# Patient Record
Sex: Female | Born: 1960 | Race: Black or African American | Hispanic: No | Smoking: Never smoker
Health system: Southern US, Community
[De-identification: ages and names within clinical notes are randomized; demographics above are authoritative.]

## PROBLEM LIST (undated history)

## (undated) DIAGNOSIS — E785 Hyperlipidemia, unspecified: Secondary | ICD-10-CM

## (undated) DIAGNOSIS — E119 Type 2 diabetes mellitus without complications: Secondary | ICD-10-CM

## (undated) DIAGNOSIS — I10 Essential (primary) hypertension: Secondary | ICD-10-CM

## (undated) DIAGNOSIS — I509 Heart failure, unspecified: Secondary | ICD-10-CM

## (undated) DIAGNOSIS — G9600 Cerebrospinal fluid leak, unspecified: Secondary | ICD-10-CM

## (undated) DIAGNOSIS — G4733 Obstructive sleep apnea (adult) (pediatric): Secondary | ICD-10-CM

## (undated) DIAGNOSIS — I428 Other cardiomyopathies: Secondary | ICD-10-CM

## (undated) DIAGNOSIS — I219 Acute myocardial infarction, unspecified: Secondary | ICD-10-CM

## (undated) DIAGNOSIS — I2510 Atherosclerotic heart disease of native coronary artery without angina pectoris: Secondary | ICD-10-CM

## (undated) DIAGNOSIS — I429 Cardiomyopathy, unspecified: Secondary | ICD-10-CM

## (undated) HISTORY — PX: OTHER SURGICAL HISTORY: SHX169

## (undated) HISTORY — DX: Heart failure, unspecified: I50.9

## (undated) HISTORY — DX: Acute myocardial infarction, unspecified: I21.9

---

## 2014-08-09 LAB — HM COLONOSCOPY

## 2015-10-16 HISTORY — PX: CERVICAL FUSION: SHX112

## 2015-10-16 HISTORY — PX: CSF SHUNT: SHX92

## 2015-10-16 HISTORY — PX: OTHER SURGICAL HISTORY: SHX169

## 2018-11-26 LAB — HM PAP SMEAR

## 2020-07-26 ENCOUNTER — Emergency Department: Payer: Medicare (Managed Care)

## 2020-07-26 ENCOUNTER — Other Ambulatory Visit: Payer: Self-pay

## 2020-07-26 ENCOUNTER — Encounter: Payer: Self-pay | Admitting: Emergency Medicine

## 2020-07-26 DIAGNOSIS — E119 Type 2 diabetes mellitus without complications: Secondary | ICD-10-CM | POA: Diagnosis not present

## 2020-07-26 DIAGNOSIS — R0789 Other chest pain: Secondary | ICD-10-CM | POA: Insufficient documentation

## 2020-07-26 LAB — CBC
HCT: 39.1 % (ref 36.0–46.0)
Hemoglobin: 12.9 g/dL (ref 12.0–15.0)
MCH: 29.1 pg (ref 26.0–34.0)
MCHC: 33 g/dL (ref 30.0–36.0)
MCV: 88.1 fL (ref 80.0–100.0)
Platelets: 330 10*3/uL (ref 150–400)
RBC: 4.44 MIL/uL (ref 3.87–5.11)
RDW: 13.9 % (ref 11.5–15.5)
WBC: 8.1 10*3/uL (ref 4.0–10.5)
nRBC: 0 % (ref 0.0–0.2)

## 2020-07-26 LAB — COMPREHENSIVE METABOLIC PANEL
ALT: 15 U/L (ref 0–44)
AST: 15 U/L (ref 15–41)
Albumin: 4 g/dL (ref 3.5–5.0)
Alkaline Phosphatase: 62 U/L (ref 38–126)
Anion gap: 9 (ref 5–15)
BUN: 14 mg/dL (ref 6–20)
CO2: 24 mmol/L (ref 22–32)
Calcium: 9 mg/dL (ref 8.9–10.3)
Chloride: 105 mmol/L (ref 98–111)
Creatinine, Ser: 0.95 mg/dL (ref 0.44–1.00)
GFR, Estimated: >60 mL/min (ref >60)
Glucose, Bld: 156 mg/dL — ABNORMAL HIGH (ref 70–99)
Potassium: 4.2 mmol/L (ref 3.5–5.1)
Sodium: 138 mmol/L (ref 135–145)
Total Bilirubin: 0.5 mg/dL (ref 0.3–1.2)
Total Protein: 7.9 g/dL (ref 6.5–8.1)

## 2020-07-26 LAB — TROPONIN I (HIGH SENSITIVITY): Troponin I (High Sensitivity): 6 ng/L (ref <18)

## 2020-07-26 NOTE — ED Triage Notes (Signed)
Pt in with co mid sternal chest pain that started today, does have hx of MI in the past. Pain started while arguing with her son, pt did take asa today, no nitro.

## 2020-07-27 ENCOUNTER — Emergency Department: Payer: Medicare (Managed Care)

## 2020-07-27 ENCOUNTER — Emergency Department
Admission: EM | Admit: 2020-07-27 | Discharge: 2020-07-27 | Disposition: A | Discharge status: Home / Self Care | Payer: Medicare (Managed Care) | Attending: Emergency Medicine | Admitting: Emergency Medicine

## 2020-07-27 DIAGNOSIS — R0789 Other chest pain: Secondary | ICD-10-CM

## 2020-07-27 HISTORY — DX: Type 2 diabetes mellitus without complications: E11.9

## 2020-07-27 HISTORY — DX: Heart failure, unspecified: I50.9

## 2020-07-27 HISTORY — DX: Cardiomyopathy, unspecified: I42.9

## 2020-07-27 LAB — TROPONIN I (HIGH SENSITIVITY): Troponin I (High Sensitivity): 6 ng/L (ref <18)

## 2020-07-27 LAB — FIBRIN DERIVATIVES D-DIMER (ARMC ONLY): Fibrin derivatives D-dimer (ARMC): 1101.33 ng/mL (FEU) — ABNORMAL HIGH (ref 0.00–499.00)

## 2020-07-27 MED ORDER — IOHEXOL 350 MG/ML SOLN
75.0000 mL | Freq: Once | INTRAVENOUS | Status: AC | PRN
  Administered 2020-07-27: 75 mL via INTRAVENOUS

## 2020-07-27 NOTE — Discharge Instructions (Addendum)
Your lab tests and CT scan of the chest were all okay today.  We were unable to find any serious cause for your chest pain, and your evaluation is reassuring.  Please follow-up with primary care for continued evaluation of your symptoms.

## 2020-07-27 NOTE — ED Notes (Signed)
Pt awaiting CT scan.

## 2020-07-27 NOTE — ED Provider Notes (Signed)
Procedures     ----------------------------------------- 8:04 AM on 07/27/2020 -----------------------------------------  CT scan of the chest unremarkable.  Vital signs unremarkable, pain resolved.  Stable for discharge home and outpatient follow-up.    Sharman Cheek, MD 07/27/20 [phone]

## 2020-07-27 NOTE — ED Notes (Signed)
Pt In with co chest pain that started yesterday while arguing. Pt states she has hx of MI years go. Denies any recent illness or cough. Pt states she did have some shob, none noted at present.

## 2020-07-27 NOTE — ED Notes (Signed)
Pt resting comfortably at this time. Pt requests to use toilet, this writer assisted pt to bedside toilet. Pt maintained steady gait. No distress noted.

## 2020-07-27 NOTE — ED Provider Notes (Signed)
[facility] Emergency Department Provider Note  ____________________________________________   First MD Initiated Contact with Patient 07/27/20 [phone]     (approximate)  I have reviewed the triage vital signs and the nursing notes.   HISTORY  Chief Complaint Chest Pain    HPI Emily Hinton is a 59 y.o. female with history of cardiomyopathy, MI in 2014, diabetes mellitus presents to the emergency department secondary to acute onset of central nonradiating chest pain which began while having a verbal altercation with her son today. Patient denies any dyspnea no lower extremity pain or swelling. Patient states that pain was 8 out of 10 on arrival to the emergency department. Patient denies any history of DVT or PE.        Past Medical History:  Diagnosis Date  . Cardiomyopathy (HCC)   . Diabetes mellitus without complication (HCC)   . Heart failure (HCC)     There are no problems to display for this patient.     Prior to Admission medications   Not on File    Allergies Patient has no allergy information on record.  No family history on file.  Social History Social History   Tobacco Use  . Smoking status: Not on file  Substance Use Topics  . Alcohol use: Not on file  . Drug use: Not on file    Review of Systems Constitutional: No fever/chills Eyes: No visual changes. ENT: No sore throat. Cardiovascular: Positive for chest pain. Respiratory: Denies shortness of breath. Gastrointestinal: No abdominal pain.  No nausea, no vomiting.  No diarrhea.  No constipation. Genitourinary: Negative for dysuria. Musculoskeletal: Negative for neck pain.  Negative for back pain. Integumentary: Negative for rash. Neurological: Negative for headaches, focal weakness or numbness.  ____________________________________________   PHYSICAL EXAM:  VITAL SIGNS: ED Triage Vitals  Enc Vitals Group     BP 07/26/20 2310 (!) 155/92      Pulse Rate 07/26/20 2310 76     Resp 07/26/20 2310 20     Temp 07/26/20 2310 98.2 F (36.8 C)     Temp Source 07/26/20 2310 Oral     SpO2 07/26/20 2310 97 %     Weight 07/26/20 2311 127.9 kg (282 lb)     Height 07/26/20 2311 1.829 m (6')     Head Circumference --      Peak Flow --      Pain Score 07/26/20 2310 8     Pain Loc --      Pain Edu? --      Excl. in GC? --     Constitutional: Alert and oriented.  Tearful Eyes: Conjunctivae are normal.  Head: Atraumatic. Mouth/Throat: Patient is wearing a mask. Neck: No stridor.  No meningeal signs.   Cardiovascular: Normal rate, regular rhythm. Good peripheral circulation. Grossly normal heart sounds. Respiratory: Normal respiratory effort.  No retractions. Gastrointestinal: Soft and nontender. No distention.  Musculoskeletal: No lower extremity tenderness nor edema. No gross deformities of extremities. Neurologic:  Normal speech and language. No gross focal neurologic deficits are appreciated.  Skin:  Skin is warm, dry and intact. Psychiatric: Mood and affect are normal. Speech and behavior are normal.  ____________________________________________   LABS (all labs ordered are listed, but only abnormal results are displayed)  Labs Reviewed  COMPREHENSIVE METABOLIC PANEL - Abnormal; Notable for the following components:      Result Value   Glucose, Bld 156 (*)    All other components within normal limits  CBC  TROPONIN I (HIGH SENSITIVITY)  TROPONIN I (HIGH SENSITIVITY)   ____________________________________________  EKG  ED ECG REPORT I, [place] N Esha Fincher, the attending physician, personally viewed and interpreted this ECG.   Date: 07/26/2020  EKG Time: 11:06 PM  Rate: 79  Rhythm: Normal sinus rhythm  Axis: Normal  Intervals:Normal  ST&T Change: None  ____________________________________________  RADIOLOGY I, [place] N Morell Mears, personally viewed and evaluated these images (plain radiographs) as part of my medical  decision making, as well as reviewing the written report by the radiologist.  ED MD interpretation: No active cardiopulmonary disease noted on chest x-ray per radiologist.  Official radiology report(s): DG Chest 2 View  Result Date: 07/26/2020 CLINICAL DATA:  Chest pain EXAM: CHEST - 2 VIEW COMPARISON:  None. FINDINGS: The heart size and mediastinal contours are within normal limits. Both lungs are clear. The visualized skeletal structures are unremarkable. IMPRESSION: No active cardiopulmonary disease. Electronically Signed   By: Deatra Robinson M.D.   On: 07/26/2020 23:39      Procedures   ____________________________________________   INITIAL IMPRESSION / MDM / ASSESSMENT AND PLAN / ED COURSE  As part of my medical decision making, I reviewed the following data within the electronic MEDICAL RECORD NUMBER   59 year old female presented with above-stated history and physical exam differential diagnosis including but not limited to ACS, PE, angina.  EKG revealed no evidence of ischemia or infarction laboratory data including high-sensitivity troponin x2 -.  Given elevated D-dimer CT angiogram of the chest ordered and pending at this time.  Patient's care transferred to Dr. Scotty Court  ____________________________________________  FINAL CLINICAL IMPRESSION(S) / ED DIAGNOSES  Final diagnoses:  Atypical chest pain     MEDICATIONS GIVEN DURING THIS VISIT:  Medications - No data to display   ED Discharge Orders    None      *Please note:  Emily Hinton was evaluated in Emergency Department on 07/27/2020 for the symptoms described in the history of present illness. She was evaluated in the context of the global COVID-19 pandemic, which necessitated consideration that the patient might be at risk for infection with the SARS-CoV-2 virus that causes COVID-19. Institutional protocols and algorithms that pertain to the evaluation of patients at risk for COVID-19 are in a state  of rapid change based on information released by regulatory bodies including the CDC and federal and state organizations. These policies and algorithms were followed during the patient's care in the ED.  Some ED evaluations and interventions may be delayed as a result of limited staffing during and after the pandemic.*  Note:  This document was prepared using Dragon voice recognition software and may include unintentional dictation errors.   Darci Current, MD 07/27/20 2223

## 2020-07-27 NOTE — ED Notes (Signed)
Pt at CT

## 2020-07-27 NOTE — ED Notes (Signed)
D/c and f/up discussed with pt, pt verbalized understanding. No distress noted. VSS.

## 2022-01-30 ENCOUNTER — Other Ambulatory Visit: Payer: Self-pay

## 2022-01-30 ENCOUNTER — Encounter: Payer: Self-pay | Admitting: Emergency Medicine

## 2022-01-30 ENCOUNTER — Inpatient Hospital Stay: Payer: Medicare (Managed Care)

## 2022-01-30 ENCOUNTER — Inpatient Hospital Stay (HOSPITAL_COMMUNITY)
Admit: 2022-01-30 | Discharge: 2022-01-30 | Disposition: A | Discharge status: Home / Self Care | Payer: Medicare (Managed Care) | Attending: Internal Medicine | Admitting: Internal Medicine

## 2022-01-30 ENCOUNTER — Emergency Department: Payer: Medicare (Managed Care)

## 2022-01-30 ENCOUNTER — Inpatient Hospital Stay
Admission: EM | Admit: 2022-01-30 | Discharge: 2022-02-03 | DRG: 291 | Disposition: A | Discharge status: Home / Self Care | Payer: Medicare (Managed Care) | Attending: Internal Medicine | Admitting: Internal Medicine

## 2022-01-30 DIAGNOSIS — I110 Hypertensive heart disease with heart failure: Principal | ICD-10-CM | POA: Diagnosis present

## 2022-01-30 DIAGNOSIS — E785 Hyperlipidemia, unspecified: Secondary | ICD-10-CM | POA: Diagnosis present

## 2022-01-30 DIAGNOSIS — I248 Other forms of acute ischemic heart disease: Secondary | ICD-10-CM | POA: Diagnosis present

## 2022-01-30 DIAGNOSIS — I2510 Atherosclerotic heart disease of native coronary artery without angina pectoris: Secondary | ICD-10-CM | POA: Diagnosis present

## 2022-01-30 DIAGNOSIS — Z7984 Long term (current) use of oral hypoglycemic drugs: Secondary | ICD-10-CM | POA: Diagnosis not present

## 2022-01-30 DIAGNOSIS — E1159 Type 2 diabetes mellitus with other circulatory complications: Secondary | ICD-10-CM | POA: Diagnosis not present

## 2022-01-30 DIAGNOSIS — E876 Hypokalemia: Secondary | ICD-10-CM | POA: Diagnosis not present

## 2022-01-30 DIAGNOSIS — I420 Dilated cardiomyopathy: Secondary | ICD-10-CM | POA: Diagnosis present

## 2022-01-30 DIAGNOSIS — Z79899 Other long term (current) drug therapy: Secondary | ICD-10-CM | POA: Diagnosis not present

## 2022-01-30 DIAGNOSIS — J9601 Acute respiratory failure with hypoxia: Secondary | ICD-10-CM | POA: Diagnosis present

## 2022-01-30 DIAGNOSIS — Z982 Presence of cerebrospinal fluid drainage device: Secondary | ICD-10-CM | POA: Diagnosis not present

## 2022-01-30 DIAGNOSIS — I5033 Acute on chronic diastolic (congestive) heart failure: Secondary | ICD-10-CM | POA: Diagnosis present

## 2022-01-30 DIAGNOSIS — Z20822 Contact with and (suspected) exposure to covid-19: Secondary | ICD-10-CM | POA: Diagnosis present

## 2022-01-30 DIAGNOSIS — I5031 Acute diastolic (congestive) heart failure: Secondary | ICD-10-CM | POA: Diagnosis not present

## 2022-01-30 DIAGNOSIS — J189 Pneumonia, unspecified organism: Secondary | ICD-10-CM | POA: Diagnosis present

## 2022-01-30 DIAGNOSIS — I5043 Acute on chronic combined systolic (congestive) and diastolic (congestive) heart failure: Secondary | ICD-10-CM | POA: Diagnosis present

## 2022-01-30 DIAGNOSIS — Z981 Arthrodesis status: Secondary | ICD-10-CM

## 2022-01-30 DIAGNOSIS — Z7982 Long term (current) use of aspirin: Secondary | ICD-10-CM | POA: Diagnosis not present

## 2022-01-30 DIAGNOSIS — I509 Heart failure, unspecified: Secondary | ICD-10-CM

## 2022-01-30 DIAGNOSIS — G4733 Obstructive sleep apnea (adult) (pediatric): Secondary | ICD-10-CM | POA: Diagnosis present

## 2022-01-30 DIAGNOSIS — I428 Other cardiomyopathies: Secondary | ICD-10-CM | POA: Diagnosis present

## 2022-01-30 DIAGNOSIS — Z91198 Patient's noncompliance with other medical treatment and regimen for other reason: Secondary | ICD-10-CM

## 2022-01-30 DIAGNOSIS — E875 Hyperkalemia: Secondary | ICD-10-CM | POA: Diagnosis not present

## 2022-01-30 DIAGNOSIS — Z6836 Body mass index (BMI) 36.0-36.9, adult: Secondary | ICD-10-CM

## 2022-01-30 DIAGNOSIS — Z794 Long term (current) use of insulin: Secondary | ICD-10-CM | POA: Diagnosis not present

## 2022-01-30 DIAGNOSIS — E119 Type 2 diabetes mellitus without complications: Secondary | ICD-10-CM

## 2022-01-30 DIAGNOSIS — J9600 Acute respiratory failure, unspecified whether with hypoxia or hypercapnia: Secondary | ICD-10-CM | POA: Diagnosis present

## 2022-01-30 DIAGNOSIS — Z6837 Body mass index (BMI) 37.0-37.9, adult: Secondary | ICD-10-CM | POA: Diagnosis not present

## 2022-01-30 DIAGNOSIS — E1165 Type 2 diabetes mellitus with hyperglycemia: Secondary | ICD-10-CM | POA: Diagnosis present

## 2022-01-30 DIAGNOSIS — I10 Essential (primary) hypertension: Secondary | ICD-10-CM | POA: Diagnosis not present

## 2022-01-30 DIAGNOSIS — E1169 Type 2 diabetes mellitus with other specified complication: Secondary | ICD-10-CM

## 2022-01-30 HISTORY — DX: Cerebrospinal fluid leak, unspecified: G96.00

## 2022-01-30 HISTORY — DX: Atherosclerotic heart disease of native coronary artery without angina pectoris: I25.10

## 2022-01-30 HISTORY — DX: Acute on chronic diastolic (congestive) heart failure: I50.33

## 2022-01-30 HISTORY — DX: Other cardiomyopathies: I42.8

## 2022-01-30 HISTORY — DX: Essential (primary) hypertension: I10

## 2022-01-30 HISTORY — DX: Morbid (severe) obesity due to excess calories: E66.01

## 2022-01-30 HISTORY — DX: Hyperlipidemia, unspecified: E78.5

## 2022-01-30 HISTORY — DX: Type 2 diabetes mellitus without complications: E11.9

## 2022-01-30 HISTORY — DX: Obstructive sleep apnea (adult) (pediatric): G47.33

## 2022-01-30 LAB — URINALYSIS, COMPLETE (UACMP) WITH MICROSCOPIC
Bacteria, UA: NONE SEEN
Bilirubin Urine: NEGATIVE
Glucose, UA: NEGATIVE mg/dL
Hgb urine dipstick: NEGATIVE
Ketones, ur: NEGATIVE mg/dL
Leukocytes,Ua: NEGATIVE
Nitrite: NEGATIVE
Protein, ur: NEGATIVE mg/dL
Specific Gravity, Urine: 1.004 — ABNORMAL LOW (ref 1.005–1.030)
WBC, UA: NONE SEEN WBC/hpf (ref 0–5)
pH: 7 (ref 5.0–8.0)

## 2022-01-30 LAB — PROCALCITONIN: Procalcitonin: <0.1 ng/mL

## 2022-01-30 LAB — ECHOCARDIOGRAM COMPLETE
AR max vel: 1.51 cm2
AV Area VTI: 1.47 cm2
AV Area mean vel: 1.57 cm2
AV Mean grad: 4 mmHg
AV Peak grad: 7.1 mmHg
Ao pk vel: 1.33 m/s
Area-P 1/2: 4.65 cm2
Height: 71 in
MV VTI: 1.58 cm2
S' Lateral: 4.4 cm
Weight: 4224 oz

## 2022-01-30 LAB — CBC WITH DIFFERENTIAL/PLATELET
Abs Immature Granulocytes: 0.03 10*3/uL (ref 0.00–0.07)
Basophils Absolute: 0 10*3/uL (ref 0.0–0.1)
Basophils Relative: 0 %
Eosinophils Absolute: 0.1 10*3/uL (ref 0.0–0.5)
Eosinophils Relative: 1 %
HCT: 48.3 % — ABNORMAL HIGH (ref 36.0–46.0)
Hemoglobin: 15.5 g/dL — ABNORMAL HIGH (ref 12.0–15.0)
Immature Granulocytes: 0 %
Lymphocytes Relative: 24 %
Lymphs Abs: 2.8 10*3/uL (ref 0.7–4.0)
MCH: 28.4 pg (ref 26.0–34.0)
MCHC: 32.1 g/dL (ref 30.0–36.0)
MCV: 88.5 fL (ref 80.0–100.0)
Monocytes Absolute: 1 10*3/uL (ref 0.1–1.0)
Monocytes Relative: 8 %
Neutro Abs: 7.7 10*3/uL (ref 1.7–7.7)
Neutrophils Relative %: 67 %
Platelets: 393 10*3/uL (ref 150–400)
RBC: 5.46 MIL/uL — ABNORMAL HIGH (ref 3.87–5.11)
RDW: 14.1 % (ref 11.5–15.5)
WBC: 11.6 10*3/uL — ABNORMAL HIGH (ref 4.0–10.5)
nRBC: 0 % (ref 0.0–0.2)

## 2022-01-30 LAB — GLUCOSE, CAPILLARY: Glucose-Capillary: 299 mg/dL — ABNORMAL HIGH (ref 70–99)

## 2022-01-30 LAB — BLOOD GAS, VENOUS
Acid-Base Excess: 1 mmol/L (ref 0.0–2.0)
Bicarbonate: 26.6 mmol/L (ref 20.0–28.0)
O2 Saturation: 34.8 %
Patient temperature: 37
pCO2, Ven: 45 mmHg (ref 44–60)
pH, Ven: 7.38 (ref 7.25–7.43)
pO2, Ven: <31 mmHg — CL (ref 32–45)

## 2022-01-30 LAB — BASIC METABOLIC PANEL
Anion gap: 10 (ref 5–15)
BUN: 11 mg/dL (ref 6–20)
CO2: 25 mmol/L (ref 22–32)
Calcium: 9.3 mg/dL (ref 8.9–10.3)
Chloride: 102 mmol/L (ref 98–111)
Creatinine, Ser: 0.87 mg/dL (ref 0.44–1.00)
GFR, Estimated: >60 mL/min (ref >60)
Glucose, Bld: 193 mg/dL — ABNORMAL HIGH (ref 70–99)
Potassium: 3.6 mmol/L (ref 3.5–5.1)
Sodium: 137 mmol/L (ref 135–145)

## 2022-01-30 LAB — RESP PANEL BY RT-PCR (FLU A&B, COVID) ARPGX2
Influenza A by PCR: NEGATIVE
Influenza B by PCR: NEGATIVE
SARS Coronavirus 2 by RT PCR: NEGATIVE

## 2022-01-30 LAB — CBG MONITORING, ED
Glucose-Capillary: 186 mg/dL — ABNORMAL HIGH (ref 70–99)
Glucose-Capillary: 181 mg/dL — ABNORMAL HIGH (ref 70–99)

## 2022-01-30 LAB — TROPONIN I (HIGH SENSITIVITY)
Troponin I (High Sensitivity): 35 ng/L — ABNORMAL HIGH (ref <18)
Troponin I (High Sensitivity): 20 ng/L — ABNORMAL HIGH (ref <18)

## 2022-01-30 LAB — BRAIN NATRIURETIC PEPTIDE: B Natriuretic Peptide: 189.7 pg/mL — ABNORMAL HIGH (ref 0.0–100.0)

## 2022-01-30 MED ORDER — ONDANSETRON HCL 4 MG/2ML IJ SOLN: 4.0000 mg | Freq: Three times a day (TID) | INTRAMUSCULAR | Status: AC | PRN

## 2022-01-30 MED ORDER — SODIUM CHLORIDE 0.9 % IV SOLN
2.0000 g | INTRAVENOUS | Status: DC
  Administered 2022-01-31 – 2022-02-03 (×4): 2 g via INTRAVENOUS
  Filled 2022-01-30: qty 20
  Filled 2022-01-30: qty 2
  Filled 2022-01-30 (×2): qty 20

## 2022-01-30 MED ORDER — INSULIN DETEMIR 100 UNIT/ML ~~LOC~~ SOLN
35.0000 [IU] | Freq: Every day | SUBCUTANEOUS | Status: DC
  Administered 2022-01-30 – 2022-02-02 (×4): 35 [IU] via SUBCUTANEOUS
  Filled 2022-01-30 (×6): qty 0.35

## 2022-01-30 MED ORDER — CARVEDILOL 6.25 MG PO TABS
6.2500 mg | ORAL_TABLET | Freq: Two times a day (BID) | ORAL | Status: DC
Start: 2022-01-30 — End: 2022-02-03
  Administered 2022-01-30 – 2022-02-03 (×9): 6.25 mg via ORAL
  Filled 2022-01-30 (×9): qty 1

## 2022-01-30 MED ORDER — ACETAMINOPHEN 650 MG RE SUPP: 650.0000 mg | Freq: Four times a day (QID) | RECTAL | Status: DC | PRN

## 2022-01-30 MED ORDER — LISINOPRIL 20 MG PO TABS
40.0000 mg | ORAL_TABLET | Freq: Every day | ORAL | Status: DC
  Administered 2022-01-30 – 2022-02-01 (×3): 40 mg via ORAL
  Filled 2022-01-30 (×2): qty 2
  Filled 2022-01-30: qty 4

## 2022-01-30 MED ORDER — SODIUM CHLORIDE 0.9 % IV SOLN
250.0000 mL | INTRAVENOUS | Status: DC | PRN
  Administered 2022-02-02: 250 mL via INTRAVENOUS

## 2022-01-30 MED ORDER — SODIUM CHLORIDE 0.9% FLUSH
3.0000 mL | Freq: Two times a day (BID) | INTRAVENOUS | Status: DC
  Administered 2022-01-30 – 2022-02-03 (×8): 3 mL via INTRAVENOUS

## 2022-01-30 MED ORDER — FUROSEMIDE 10 MG/ML IJ SOLN: 40.0000 mg | Freq: Two times a day (BID) | INTRAMUSCULAR | Status: DC

## 2022-01-30 MED ORDER — ENOXAPARIN SODIUM 40 MG/0.4ML IJ SOSY: 40.0000 mg | PREFILLED_SYRINGE | INTRAMUSCULAR | Status: DC

## 2022-01-30 MED ORDER — PANTOPRAZOLE SODIUM 40 MG PO TBEC
40.0000 mg | DELAYED_RELEASE_TABLET | Freq: Every day | ORAL | Status: DC
  Administered 2022-01-30 – 2022-02-03 (×5): 40 mg via ORAL
  Filled 2022-01-30 (×5): qty 1

## 2022-01-30 MED ORDER — GABAPENTIN 600 MG PO TABS
600.0000 mg | ORAL_TABLET | Freq: Three times a day (TID) | ORAL | Status: DC
  Administered 2022-01-30 – 2022-02-03 (×13): 600 mg via ORAL
  Filled 2022-01-30 (×13): qty 1

## 2022-01-30 MED ORDER — INSULIN ASPART 100 UNIT/ML IJ SOLN
0.0000 [IU] | Freq: Three times a day (TID) | INTRAMUSCULAR | Status: DC
  Administered 2022-01-30 – 2022-01-31 (×4): 3 [IU] via SUBCUTANEOUS
  Administered 2022-01-31 – 2022-02-01 (×2): 5 [IU] via SUBCUTANEOUS
  Administered 2022-02-01 – 2022-02-03 (×6): 3 [IU] via SUBCUTANEOUS
  Filled 2022-01-30 (×12): qty 1

## 2022-01-30 MED ORDER — ACETAMINOPHEN 500 MG PO TABS
1000.0000 mg | ORAL_TABLET | Freq: Once | ORAL | Status: AC
  Administered 2022-01-30: 1000 mg via ORAL
  Filled 2022-01-30: qty 2

## 2022-01-30 MED ORDER — SODIUM CHLORIDE 0.9 % IV SOLN
500.0000 mg | INTRAVENOUS | Status: DC
  Administered 2022-01-31 – 2022-02-03 (×4): 500 mg via INTRAVENOUS
  Filled 2022-01-30 (×3): qty 5
  Filled 2022-01-30: qty 500

## 2022-01-30 MED ORDER — ENOXAPARIN SODIUM 60 MG/0.6ML IJ SOSY
0.5000 mg/kg | PREFILLED_SYRINGE | INTRAMUSCULAR | Status: DC
  Administered 2022-01-30 – 2022-02-02 (×4): 60 mg via SUBCUTANEOUS
  Filled 2022-01-30 (×4): qty 0.6

## 2022-01-30 MED ORDER — SODIUM CHLORIDE 0.9% FLUSH: 3.0000 mL | INTRAVENOUS | Status: DC | PRN

## 2022-01-30 MED ORDER — SPIRONOLACTONE 25 MG PO TABS
25.0000 mg | ORAL_TABLET | Freq: Every day | ORAL | Status: DC
  Administered 2022-01-30 – 2022-02-03 (×5): 25 mg via ORAL
  Filled 2022-01-30 (×5): qty 1

## 2022-01-30 MED ORDER — ASPIRIN 81 MG PO CHEW
81.0000 mg | CHEWABLE_TABLET | Freq: Every day | ORAL | Status: DC
Start: 2022-01-30 — End: 2022-02-03
  Administered 2022-01-30 – 2022-02-03 (×5): 81 mg via ORAL
  Filled 2022-01-30 (×5): qty 1

## 2022-01-30 MED ORDER — ACETAMINOPHEN 325 MG PO TABS
650.0000 mg | ORAL_TABLET | Freq: Four times a day (QID) | ORAL | Status: DC | PRN
  Administered 2022-02-02 (×2): 650 mg via ORAL
  Filled 2022-01-30 (×2): qty 2

## 2022-01-30 MED ORDER — TORSEMIDE 20 MG PO TABS
20.0000 mg | ORAL_TABLET | Freq: Every day | ORAL | Status: DC
  Administered 2022-01-31: 20 mg via ORAL
  Filled 2022-01-30: qty 1

## 2022-01-30 MED ORDER — SODIUM CHLORIDE 0.9 % IV SOLN
1.0000 g | Freq: Once | INTRAVENOUS | Status: AC
  Administered 2022-01-30: 1 g via INTRAVENOUS
  Filled 2022-01-30: qty 10

## 2022-01-30 MED ORDER — ALBUTEROL SULFATE (2.5 MG/3ML) 0.083% IN NEBU: 2.5000 mg | INHALATION_SOLUTION | RESPIRATORY_TRACT | Status: AC | PRN

## 2022-01-30 MED ORDER — SODIUM CHLORIDE 0.9 % IV SOLN
500.0000 mg | Freq: Once | INTRAVENOUS | Status: AC
  Administered 2022-01-30: 500 mg via INTRAVENOUS
  Filled 2022-01-30: qty 5

## 2022-01-30 MED ORDER — FUROSEMIDE 10 MG/ML IJ SOLN
40.0000 mg | Freq: Once | INTRAMUSCULAR | Status: AC
  Administered 2022-01-30: 40 mg via INTRAVENOUS
  Filled 2022-01-30: qty 4

## 2022-01-30 NOTE — Assessment & Plan Note (Addendum)
Patient presents for evaluation of worsening shortness of breath associated with a cough productive of blood-tinged phlegm ?Chest x-ray shows findings concerning for CHF ?Patient has a known history of nonischemic cardiomyopathy with a last known LVEF of 55% from a 2D echocardiogram done 07/22 ?Continue torsemide 20 mg daily.  Patient received a dose of Lasix 40 mg IV x1 ?Continue carvedilol, spironolactone and lisinopril ?We will repeat 2D echocardiogram to assess LVEF ?

## 2022-01-30 NOTE — Plan of Care (Signed)
Patient has been educated on her current condition and care plan for this admission. ?

## 2022-01-30 NOTE — Assessment & Plan Note (Signed)
Patient presents for evaluation of worsening shortness of breath associated with cough productive of blood-tinged sputum ?She had a low-grade fever with a Tmax of 100.1 as well as leukocytosis ?CT scan of the chest without contrast shows multifocal pneumonia ?We will treat patient empirically with Rocephin and Zithromax ?Follow-up results of blood cultures ? ?

## 2022-01-30 NOTE — Assessment & Plan Note (Addendum)
Secondary to acute diastolic dysfunction CHF and multifocal pneumonia ?Patient presented to the ER for evaluation of worsening shortness of breath and was noted to be in respiratory distress, she was tachypneic and was using accessory muscles upon presentation. ?Her room air pulse oximetry was 72% ?She is currently on BiPAP to improve oxygenation and reduce work of breathing. ?We will attempt to wean off BiPAP as tolerated ?

## 2022-01-30 NOTE — ED Notes (Signed)
Pt transported to CT via stretcher.  

## 2022-01-30 NOTE — Progress Notes (Signed)
Patient arrives floor alert and oriented. Vital signs taken are below ? ? ? 01/30/22 2022  ?Vitals  ?Temp 98.2 ?F (36.8 ?C)  ?BP 120/82  ?MAP (mmHg) 93  ?BP Location Right Arm  ?BP Method Automatic  ?Patient Position (if appropriate) Lying  ?Pulse Rate 89  ?Pulse Rate Source Monitor  ?Resp 20  ?MEWS COLOR  ?MEWS Score Color Green  ?Oxygen Therapy  ?SpO2 100 %  ?O2 Device Nasal Cannula  ?O2 Flow Rate (L/min) 4 L/min  ?MEWS Score  ?MEWS Temp 0  ?MEWS Systolic 0  ?MEWS Pulse 0  ?MEWS RR 0  ?MEWS LOC 0  ?MEWS Score 0  ? ? ?

## 2022-01-30 NOTE — ED Notes (Signed)
Patient complains of shortness of breath with Oxygen saturation at 72% in ED. Patient currently on 4L [place], takes pills whole with water. Alert and Oriented. Diabetic and AC/HS. VTE is Lovenox. ?

## 2022-01-30 NOTE — Assessment & Plan Note (Signed)
Maintain consistent carbohydrate diet ?Hold oral hypoglycemic agents ?Continue long-acting insulin ?Glycemic control with sliding scale insulin ?

## 2022-01-30 NOTE — ED Provider Notes (Signed)
? ?[facility] ?Provider Note ? ? ? Event Date/Time  ? First MD Initiated Contact with Patient 01/30/22 0316   ?  (approximate) ? ? ?History  ? ?Shortness of Breath and Cough ? ? ?HPI ? ?Emily Hinton is a 61 y.o. 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  Patient reports 2 days of progressively worsening shortness of breath and a cough initially productive of clear phlegm now with some blood streaking on it.  Today had some chills but denies any fever.  No chest pain.  No prior history of PE or DVT, no recent travel immobilization, no leg pain or swelling, no hemoptysis or exogenous hormones.  Endorses compliance with her medications.  Patient arrives in severe respiratory distress ?  ? ? ?Past Medical History:  ?Diagnosis Date  ? Cardiomyopathy (HCC)   ? Diabetes mellitus without complication (HCC)   ? Heart failure (HCC)   ? ? ?History reviewed. No pertinent surgical history. ? ? ?Physical Exam  ? ?Triage Vital Signs: ?ED Triage Vitals  ?Enc Vitals Group  ?   BP 01/30/22 0316 (!) 184/110  ?   Pulse Rate 01/30/22 0316 (!) 117  ?   Resp 01/30/22 0316 (!) 34  ?   Temp 01/30/22 0316 100.1 ?F (37.8 ?C)  ?   Temp Source 01/30/22 0316 Oral  ?   SpO2 01/30/22 0316 (!) 72 %  ?   Weight 01/30/22 0315 264 lb (119.7 kg)  ?   Height 01/30/22 0315 5\' 11"  (1.803 m)  ?   Head Circumference --   ?   Peak Flow --   ?   Pain Score 01/30/22 0315 0  ?   Pain Loc --   ?   Pain Edu? --   ?   Excl. in GC? --   ? ? ?Most recent vital signs: ?Vitals:  ? 01/30/22 0352 01/30/22 0400  ?BP:  (!) 146/96  ?Pulse: 99 97  ?Resp: (!) 29 (!) 25  ?Temp:    ?SpO2: 97% 96%  ? ? ? ?Constitutional: Alert and oriented.  Severe respiratory distress ?HEENT: ?     Head: Normocephalic and atraumatic.    ?     Eyes: Conjunctivae are normal. Sclera is non-icteric.  ?     Mouth/Throat: Mucous membranes are  moist.  ?     Neck: Supple with no signs of meningismus. ?Cardiovascular: Regular rate and rhythm. No murmurs, gallops, or rubs. 2+ symmetrical distal pulses are present in all extremities.  ?Respiratory: Severe respiratory distress with sats in the 70s.  Placed on 6 L nasal cannula still satting in the upper 80s, actively coughing, crackles bilaterally with no wheezing ?Gastrointestinal: Soft, non tender, and non distended with positive bowel sounds. No rebound or guarding. ?Genitourinary: No CVA tenderness. ?Musculoskeletal:  No edema, cyanosis, or erythema of extremities.  Trace edema bilaterally ?Neurologic: Normal speech and language. Face is symmetric. Moving all extremities. No gross focal neurologic deficits are appreciated. ?Skin: Skin is warm, dry and intact. No rash noted. ?Psychiatric: Mood and affect are normal. Speech and behavior are normal. ? ?ED Results / Procedures / Treatments  ? ?Labs ?(all labs ordered are listed, but only abnormal results are displayed) ?Labs Reviewed  ?BRAIN NATRIURETIC PEPTIDE - Abnormal; Notable for the following components:  ?    Result Value  ? B Natriuretic  Peptide 189.7 (*)   ? All other components within normal limits  ?BASIC METABOLIC PANEL - Abnormal; Notable for the following components:  ? Glucose, Bld 193 (*)   ? All other components within normal limits  ?CBC WITH DIFFERENTIAL/PLATELET - Abnormal; Notable for the following components:  ? WBC 11.6 (*)   ? RBC 5.46 (*)   ? Hemoglobin 15.5 (*)   ? HCT 48.3 (*)   ? All other components within normal limits  ?BLOOD GAS, VENOUS - Abnormal; Notable for the following components:  ? pO2, Ven <31 (*)   ? All other components within normal limits  ?TROPONIN I (HIGH SENSITIVITY) - Abnormal; Notable for the following components:  ? Troponin I (High Sensitivity) 35 (*)   ? All other components within normal limits  ?RESP PANEL BY RT-PCR (FLU A&B, COVID) ARPGX2  ?CULTURE, BLOOD (ROUTINE X 2)  ?CULTURE, BLOOD (ROUTINE X 2)   ?PROCALCITONIN  ? ? ? ?EKG ? ?ED ECG REPORT ?I, Nita Sickle, the attending physician, personally viewed and interpreted this ECG. ? ?Sinus rhythm with a rate of 98, normal intervals, normal axis, no ST elevations or depressions ? ?RADIOLOGY ?I, Nita Sickle, attending MD, have personally viewed and interpreted the images obtained during this visit as below: ? ?Chest x-ray showing CHF exacerbation with cardiomegaly and edema ? ? ?___________________________________________________ ?Interpretation by Radiologist:  ?DG Chest Portable 1 View ? ?Result Date: 01/30/2022 ?CLINICAL DATA:  Shortness of breath and coughing up blood. Oxygenation 72% on room air. EXAM: PORTABLE CHEST 1 VIEW COMPARISON:  PA and lateral 07/27/2019 FINDINGS: There is mild cardiomegaly and increased perihilar vascular congestion. There is development of generalized interstitial consolidation consistent with interstitial edema, and small pleural effusions. There is trace fluid in the horizontal fissure. There are bilateral perihilar hazy opacities which are probably due to ground-glass edema, pneumonia possible but less likely given other findings. The peripheral lungs clear. There is mild chronic elevation of the right diaphragm. Stable mediastinal configuration.  Thoracic cage is intact. IMPRESSION: 1. Mild cardiomegaly with perihilar vascular congestion and generalized interstitial edema consistent with CHF or fluid overload. 2. Perihilar opacities which are probably due to alveolar edema, less likely pneumonia. 3. Small pleural effusions are beginning to form. 4. Clinical correlation and radiographic follow-up recommended. Electronically Signed   By: Almira Bar M.D.   On: 01/30/2022 03:46   ? ? ? ? ?PROCEDURES: ? ?Critical Care performed: Yes, see critical care procedure note(s) ? ?.Critical Care ?Performed by: Nita Sickle, MD ?Authorized by: Nita Sickle, MD  ? ?Critical care provider statement:  ?  Critical care  time (minutes):  40 ?  Critical care time was exclusive of:  Separately billable procedures and treating other patients ?  Critical care was necessary to treat or prevent imminent or life-threatening deterioration of the following conditions:  Respiratory failure, circulatory failure, cardiac failure and shock ?  Critical care was time spent personally by me on the following activities:  Development of treatment plan with patient or surrogate, discussions with consultants, evaluation of patient's response to treatment, examination of patient, ordering and review of laboratory studies, ordering and review of radiographic studies, ordering and performing treatments and interventions, pulse oximetry, re-evaluation of patient's condition and review of old charts ?  I assumed direction of critical care for this patient from another provider in my specialty: no   ?  Care discussed with: admitting provider   ? ? ? ?IMPRESSION / MDM / ASSESSMENT AND PLAN / ED COURSE  ?  I reviewed the triage vital signs and the nursing notes. ? ?61 y.o. 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  Patient arrives in severe respiratory distress satting in the low 70s on room air.  Placed on 6 L nasal cannula with sats in the mid to upper 80s.  Crackles bilaterally and trace pitting edema bilaterally.  Patient was placed on BiPAP immediately ? ?Ddx: CHF exacerbation versus pneumonia versus viral syndrome such as COVID or flu versus pericarditis versus myocarditis versus pericardial effusion versus PE ? ? ?Plan: EKG, troponin x2, chest x-ray, BMP, BNP, CBC, VBG, COVID and flu swab ? ? ?MEDICATIONS GIVEN IN ED: ?Medications  ?cefTRIAXone (ROCEPHIN) 1 g in sodium chloride 0.9 % 100 mL IVPB (has no administration in time range)  ?azithromycin (ZITHROMAX) 500 mg in sodium chloride 0.9 % 250 mL IVPB (has no administration in time  range)  ?acetaminophen (TYLENOL) tablet 1,000 mg (has no administration in time range)  ?furosemide (LASIX) injection 40 mg (40 mg Intravenous Given 01/30/22 0413)  ? ? ? ?ED COURSE: Chest x-ray consistent with CHF ex

## 2022-01-30 NOTE — ED Triage Notes (Signed)
Pt to ED via POV with c/o increasing SOB and coughing up blood. Pt states noted streaks of blood in her sputum. Pt with noted tachypnea in triage. Pt RA sats noted to be 72% in triage, placed on 4L via [place] and taken to room 12.  ?

## 2022-01-30 NOTE — Progress Notes (Signed)
PHARMACIST - PHYSICIAN COMMUNICATION ? ?CONCERNING:  Enoxaparin (Lovenox) for DVT Prophylaxis  ? ? ?RECOMMENDATION: ?Patient was prescribed enoxaprin 40mg  q24 hours for VTE prophylaxis.  ? Weights  ? 01/30/22 0315  ?Weight: 119.7 kg (264 lb)  ? ? ?Body mass index is 36.82 kg/m?. ? ?Estimated Creatinine Clearance: 99.4 mL/min (by C-G formula based on SCr of 0.87 mg/dL). ? ? ?Based on [facility] policy patient is candidate for enoxaparin 0.5mg /kg TBW SQ every 24 hours based on BMI being >30. ? ? ?DESCRIPTION: ?Pharmacy has adjusted enoxaparin dose per [facility] policy. ? ?Patient is now receiving enoxaparin 60 mg every 24 hours  ? ? ?Alexsandro Salek Rodriguez-Guzman PharmD, BCPS ?01/30/2022 10:48 AM ? ? ?

## 2022-01-30 NOTE — Progress Notes (Addendum)
ED Nurse Cinda Quest Motsinger, (RN) just informed (direct messaged) Inpatient RN of room assignment @ 7:45 pm. Informed to send patient up at 7:50 pm ? ?SBAR completed. ?

## 2022-01-30 NOTE — ED Notes (Signed)
Informed RN bed assigned 

## 2022-01-30 NOTE — TOC Initial Note (Signed)
Transition of Care (TOC) - Initial/Assessment Note  ? ? ?Patient Details  ?Name: Emily Hinton ?MRN: [id number] ?Date of Birth: 12/07/1960 ? ?Transition of Care (TOC) CM/SW Contact:    ?Shelbie Hutching, RN ?Phone Number: ?01/30/2022, 11:04 AM ? ?Clinical Narrative:                 ?Patient admitted for acute respiratory failure, acute on chronic CHF on acute O2 [place] at 4L, does not wear oxygen at home. ?RNCM met with patient at the bedside, introduced self and explained role in DC planning.  Patient recently moved down here from [place].  She is living with her father and 2 grown sons.  Patient is independent at home, she drives very short distances but her sons provide most of the transportation.  She has not had a chance yet to establish new doctors or change her insurance but she is aware she needs to do this and will work on it as soon as she gets out of the hospital.  She is on medications but she gets 3 month supply at the time so has plenty until she gets a new PCP.  She requests a prescription for lasix at discharge- she will need to speak with the MD about that.  One of her sons will pick her up at discharge.   ? ?TOC will follow for needs.   ? ?Expected Discharge Plan: Home/Self Care ?Barriers to Discharge: Continued Medical Work up ? ? ?Patient Goals and CMS Choice ?Patient states their goals for this hospitalization and ongoing recovery are:: to feel better, would like Lasix prescribed at discharge ?  ?  ? ?Expected Discharge Plan and Services ?Expected Discharge Plan: Home/Self Care ?  ?Discharge Planning Services: CM Consult ?  ?Living arrangements for the past 2 months: [place] ?                ?DME Arranged: N/A ?DME Agency: NA ?  ?  ?  ?HH Arranged: NA ?[place] Agency: NA ?  ?  ?  ? ?Prior Living Arrangements/Services ?Living arrangements for the past 2 months: [place] ?Lives with:: Parents, Adult Children ?Patient language and need for interpreter reviewed:: Yes ?Do you feel  safe going back to the place where you live?: Yes      ?Need for Family Participation in Patient Care: Yes (Comment) ?Care giver support system in place?: Yes (comment) (sons and dad) ?  ?Criminal Activity/Legal Involvement Pertinent to Current Situation/Hospitalization: No - Comment as needed ? ?Activities of Daily Living ?  ?  ? ?Permission Sought/Granted ?Permission sought to share information with : Case Manager, Family Supports ?Permission granted to share information with : Yes, Verbal Permission Granted ? Share Information with NAME: Margaret Pyle ?   ? Permission granted to share info w Relationship: son ? Permission granted to share info w Contact Information: [phone] ? ?Emotional Assessment ?Appearance:: Appears stated age ?Attitude/Demeanor/Rapport: Engaged ?Affect (typically observed): Accepting ?Orientation: : Oriented to Self, Oriented to Place, Oriented to  Time, Oriented to Situation ?Alcohol / Substance Use: Not Applicable ?Psych Involvement: No (comment) ? ?Admission diagnosis:  CAP (community acquired pneumonia) [J18.9] ?Patient Active Problem List  ? Diagnosis Date Noted  ? CAP (community acquired pneumonia) 01/30/2022  ? Acute respiratory failure ([place]) 01/30/2022  ? Acute on chronic diastolic CHF (congestive heart failure) ([place]) 01/30/2022  ? Diabetes mellitus ([place]) 01/30/2022  ? Essential hypertension 01/30/2022  ? ?PCP:  Patient, No Pcp Per (Inactive) ?Pharmacy:   ?  [id number] [address], [address] ?[place] ?[place] [address] ?Phone: [phone] Fax: [phone] ? ? ? ? ?Social Determinants of Health (SDOH) Interventions ?  ? ?Readmission Risk Interventions ?   ? View : No data to display.  ?  ?  ?  ? ? ? ?

## 2022-01-30 NOTE — H&P (Addendum)
?History and Physical  ? ? ?Patient: Emily Hinton [id number] DOB: 07-11-1961 ?DOA: 01/30/2022 ?DOS: the patient was seen and examined on 01/30/2022 ?PCP: Patient, No Pcp Per (Inactive)  ?Patient coming from: Home ? ?Chief Complaint:  ?Chief Complaint  ?Patient presents with  ? Shortness of Breath  ? Cough  ? ?HPI: Emily Hinton is a 61 y.o. female with medical history significant for nonischemic cardiomyopathy (last known LVEF of 55% from 07/22), chronic diastolic dysfunction CHF, diabetes mellitus who presents to the ER for evaluation of worsening shortness of breath and a cough productive of sputum streaked with blood. ?At baseline patient is usually short of breath with moderate exertion but over the last several days she has had worsening shortness of breath to the point that she is short of breath at rest.  She denies having any fever or chills, no leg swelling, no headache, no dizziness, no lightheadedness, no chest pain, no nausea, no vomiting, no blurred vision or diaphoresis. ?Upon arrival to the ER she was noted to be in respiratory distress and had room air pulse oximetry of 72%.  She was placed on 4 L of oxygen via nasal cannula and then transition to BiPAP to improve oxygenation and reduce work of breathing. ?Blood pressure was significantly elevated at 184/110 5 she had a low-grade temp with a Tmax of 100.1 ?Review of Systems: As mentioned in the history of present illness. All other systems reviewed and are negative. ?Past Medical History:  ?Diagnosis Date  ? Cardiomyopathy ([place])   ? Diabetes mellitus without complication ([place])   ? Heart failure ([place])   ? ?History reviewed. No pertinent surgical history. ?Social History:  has no history on file for tobacco use, alcohol use, and drug use. ? ?Allergies  ?Allergen Reactions  ? Cashew Nut Oil Hives, Itching and Rash  ? ? ?History reviewed. No pertinent family history. ? ?Prior to Admission medications   ?Medication Sig Start  Date End Date Taking? Authorizing Provider  ?amLODipine (NORVASC) 2.5 MG tablet Take 2.5 mg by mouth daily. 09/15/21  Yes [provider]  ?aspirin 81 MG chewable tablet Chew 81 mg by mouth daily. 09/15/21  Yes [provider]  ?carvedilol (COREG) 6.25 MG tablet Take 1 tablet by mouth 2 (two) times daily. 01/16/22  Yes [provider]  ?empagliflozin (JARDIANCE) 25 MG TABS tablet Take 25 mg by mouth daily. 09/15/21 03/14/22 Yes [provider]  ?gabapentin (NEURONTIN) 600 MG tablet Take 600 mg by mouth 3 (three) times daily.   Yes [provider]  ?insulin detemir (LEVEMIR) 100 UNIT/ML injection Inject 35 Units into the skin at bedtime.   Yes [provider]  ?lisinopril (ZESTRIL) 40 MG tablet Take 40 mg by mouth daily. 09/15/21  Yes [provider]  ?metFORMIN (GLUCOPHAGE) 1000 MG tablet Take 1,000 mg by mouth 2 (two) times daily with a meal.   Yes [provider]  ?omeprazole (PRILOSEC) 20 MG capsule Take 20 mg by mouth daily.   Yes [provider]  ?Semaglutide (OZEMPIC, 0.25 OR 0.5 MG/DOSE, [place]) Inject 0.5 mg into the skin once a week.   Yes [provider]  ?spironolactone (ALDACTONE) 25 MG tablet Take 25 mg by mouth daily.   Yes [provider]  ?torsemide (DEMADEX) 20 MG tablet Take 20 mg by mouth daily. 01/16/22  Yes [provider]  ? ? ?Physical Exam: ?Vitals:  ? 01/30/22 0904 01/30/22 0930 01/30/22 1155 01/30/22 1156  ?BP: 120/83 124/83 130/88 130/88  ?  Pulse: 86 97 92   ?Resp: 13 (!) 28 20   ?Temp: 97.6 ?F (36.4 ?C)     ?TempSrc: Oral     ?SpO2: 93% 97% 97%   ?Weight:      ?Height:      ? ?Physical Exam ?Constitutional:   ?   Appearance: She is obese.  ?   Comments: Comfortable on BiPAP.  ?HENT:  ?   Head: Normocephalic and atraumatic.  ?   Mouth/Throat:  ?   Mouth: Mucous membranes are moist.  ?Eyes:  ?   Pupils: Pupils are equal, round, and reactive to light.  ?Pulmonary:  ?   Effort: Tachypnea present.  ?    Breath sounds: Examination of the right-lower field reveals rales. Examination of the left-lower field reveals rales. Rales present.  ?Abdominal:  ?   General: Bowel sounds are normal.  ?   Palpations: Abdomen is soft.  ?Musculoskeletal:     ?   General: Normal range of motion.  ?Skin: ?   General: Skin is warm and dry.  ?Neurological:  ?   General: No focal deficit present.  ?   Mental Status: She is alert.  ?Psychiatric:     ?   Mood and Affect: Mood normal.     ?   Behavior: Behavior normal.  ? ? ?Data Reviewed: ?Relevant notes from primary care and specialist visits, past discharge summaries as available in EHR, including Care Everywhere. ?Prior diagnostic testing as pertinent to current admission diagnoses ?Updated medications and problem lists for reconciliation ?ED course, including vitals, labs, imaging, treatment and response to treatment ?Triage notes, nursing and pharmacy notes and ED provider's notes ?Notable results as noted in HPI ?Labs reviewed.  Sodium 137, potassium 3.6, chloride 102, bicarb 25, glucose 193, BUN 11, creatinine 0.87, calcium 9.3, troponin 35 >> 20, white count 11.6, hemoglobin 15.5, hematocrit 48.3, MCV 88.5, RDW 14.1, platelet count 393, BNP 189, procalcitonin less than 0.10 ?Respiratory viral panel is negative ?Chest x-ray reviewed by me shows mild cardiomegaly with perihilar vascular congestion and generalized interstitial edema consistent with CHF or fluid overload. Perihilar opacities which are probably due to alveolar edema, less likely pneumonia. Small pleural effusions are beginning to form. ?Twelve-lead EKG reviewed by me shows sinus rhythm with LVH and left atrial enlargement ?There are no new results to review at this time. ? ?Assessment and Plan: ?* Acute respiratory failure ([place]) ?Secondary to acute diastolic dysfunction CHF and multifocal pneumonia ?Patient presented to the ER for evaluation of worsening shortness of breath and was noted to be in respiratory distress,  she was tachypneic and was using accessory muscles upon presentation. ?Her room air pulse oximetry was 72% ?She is currently on BiPAP to improve oxygenation and reduce work of breathing. ?We will attempt to wean off BiPAP as tolerated ? ?Acute on chronic diastolic CHF (congestive heart failure) ([place]) ?Patient presents for evaluation of worsening shortness of breath associated with a cough productive of blood-tinged phlegm ?Chest x-ray shows findings concerning for CHF ?Patient has a known history of nonischemic cardiomyopathy with a last known LVEF of 55% from a 2D echocardiogram done 07/22 ?Continue torsemide 20 mg daily.  Patient received a dose of Lasix 40 mg IV x1 ?Continue carvedilol, spironolactone and lisinopril ?We will repeat 2D echocardiogram to assess LVEF ? ?Diabetes mellitus ([place]) ?Maintain consistent carbohydrate diet ?Hold oral hypoglycemic agents ?Continue long-acting insulin ?Glycemic control with sliding scale insulin ? ?Essential hypertension ?Blood pressure stable on carvedilol and lisinopril ? ?CAP (  community acquired pneumonia) ?Patient presents for evaluation of worsening shortness of breath associated with cough productive of blood-tinged sputum ?She had a low-grade fever with a Tmax of 100.1 as well as leukocytosis ?CT scan of the chest without contrast shows multifocal pneumonia ?We will treat patient empirically with Rocephin and Zithromax ?Follow-up results of blood cultures ? ? ? ? ? ? Advance Care Planning:   Code Status: Full Code  ? ?Consults: None ? ?Family Communication: Greater than 50% of time was spent discussing plan of care with patient at the bedside.  All questions and concerns have been addressed.  She verbalizes understanding and agrees with the plan. ? ?Severity of Illness: ?The appropriate patient status for this patient is INPATIENT. Inpatient status is judged to be reasonable and necessary in order to provide the required intensity of service to ensure the patient's  safety. The patient's presenting symptoms, physical exam findings, and initial radiographic and laboratory data in the context of their chronic comorbidities is felt to place them at high risk for furt

## 2022-01-30 NOTE — Assessment & Plan Note (Signed)
Blood pressure stable on carvedilol and lisinopril ?

## 2022-01-31 DIAGNOSIS — I10 Essential (primary) hypertension: Secondary | ICD-10-CM | POA: Diagnosis not present

## 2022-01-31 DIAGNOSIS — J189 Pneumonia, unspecified organism: Secondary | ICD-10-CM | POA: Diagnosis not present

## 2022-01-31 DIAGNOSIS — J9600 Acute respiratory failure, unspecified whether with hypoxia or hypercapnia: Secondary | ICD-10-CM | POA: Diagnosis not present

## 2022-01-31 LAB — CBC
HCT: 41.8 % (ref 36.0–46.0)
Hemoglobin: 13.6 g/dL (ref 12.0–15.0)
MCH: 29.1 pg (ref 26.0–34.0)
MCHC: 32.5 g/dL (ref 30.0–36.0)
MCV: 89.5 fL (ref 80.0–100.0)
Platelets: 283 10*3/uL (ref 150–400)
RBC: 4.67 MIL/uL (ref 3.87–5.11)
RDW: 14.3 % (ref 11.5–15.5)
WBC: 8.1 10*3/uL (ref 4.0–10.5)
nRBC: 0 % (ref 0.0–0.2)

## 2022-01-31 LAB — BASIC METABOLIC PANEL
Anion gap: 6 (ref 5–15)
BUN: 14 mg/dL (ref 6–20)
CO2: 25 mmol/L (ref 22–32)
Calcium: 8.2 mg/dL — ABNORMAL LOW (ref 8.9–10.3)
Chloride: 105 mmol/L (ref 98–111)
Creatinine, Ser: 0.95 mg/dL (ref 0.44–1.00)
GFR, Estimated: >60 mL/min (ref >60)
Glucose, Bld: 215 mg/dL — ABNORMAL HIGH (ref 70–99)
Potassium: 3.4 mmol/L — ABNORMAL LOW (ref 3.5–5.1)
Sodium: 136 mmol/L (ref 135–145)

## 2022-01-31 LAB — GLUCOSE, CAPILLARY
Glucose-Capillary: 156 mg/dL — ABNORMAL HIGH (ref 70–99)
Glucose-Capillary: 192 mg/dL — ABNORMAL HIGH (ref 70–99)
Glucose-Capillary: 204 mg/dL — ABNORMAL HIGH (ref 70–99)
Glucose-Capillary: 226 mg/dL — ABNORMAL HIGH (ref 70–99)
Glucose-Capillary: 236 mg/dL — ABNORMAL HIGH (ref 70–99)

## 2022-01-31 LAB — HIV ANTIBODY (ROUTINE TESTING W REFLEX): HIV Screen 4th Generation wRfx: NONREACTIVE

## 2022-01-31 LAB — HEMOGLOBIN A1C
Hgb A1c MFr Bld: 8.7 % — ABNORMAL HIGH (ref 4.8–5.6)
Mean Plasma Glucose: 202.99 mg/dL

## 2022-01-31 MED ORDER — FUROSEMIDE 10 MG/ML IJ SOLN
40.0000 mg | Freq: Every day | INTRAMUSCULAR | Status: DC
  Administered 2022-02-01 – 2022-02-03 (×3): 40 mg via INTRAVENOUS
  Filled 2022-01-31 (×3): qty 4

## 2022-01-31 MED ORDER — NITROGLYCERIN 0.4 MG SL SUBL
SUBLINGUAL_TABLET | SUBLINGUAL | Status: AC
  Administered 2022-01-31: 0.4 mg
  Filled 2022-01-31: qty 1

## 2022-01-31 MED ORDER — GUAIFENESIN ER 600 MG PO TB12
1200.0000 mg | ORAL_TABLET | Freq: Two times a day (BID) | ORAL | Status: DC
  Administered 2022-01-31 – 2022-02-03 (×7): 1200 mg via ORAL
  Filled 2022-01-31 (×7): qty 2

## 2022-01-31 MED ORDER — NITROGLYCERIN 0.4 MG SL SUBL
0.4000 mg | SUBLINGUAL_TABLET | SUBLINGUAL | Status: DC | PRN
  Administered 2022-01-31 (×2): 0.4 mg via SUBLINGUAL

## 2022-01-31 MED ORDER — LIVING WELL WITH DIABETES BOOK
Freq: Once | Status: AC
  Filled 2022-01-31: qty 1

## 2022-01-31 MED ORDER — IPRATROPIUM-ALBUTEROL 0.5-2.5 (3) MG/3ML IN SOLN: 3.0000 mL | Freq: Four times a day (QID) | RESPIRATORY_TRACT | Status: DC | PRN

## 2022-01-31 MED ORDER — POTASSIUM CHLORIDE CRYS ER 20 MEQ PO TBCR
20.0000 meq | EXTENDED_RELEASE_TABLET | Freq: Once | ORAL | Status: AC
  Administered 2022-01-31: 20 meq via ORAL
  Filled 2022-01-31: qty 1

## 2022-01-31 NOTE — Progress Notes (Signed)
?PROGRESS NOTE ? ? ? ?Emily Hinton  [id number] DOB: 01-27-1961 DOA: 01/30/2022 ?PCP: Patient, No Pcp Per (Inactive)  ? ?Assessment & Plan: ?  ?Principal Problem: ?  Acute respiratory failure ([place]) ?Active Problems: ?  Acute on chronic diastolic CHF (congestive heart failure) ([place]) ?  Diabetes mellitus ([place]) ?  Essential hypertension ?  CAP (community acquired pneumonia) ? ? ?Acute hypoxic respiratory failure: likely secondary to PNA & CHF. Continue on IV abxs & torsemide. 72% on RA. Continue on supplemental oxygen and wean as tolerated ?  ?Acute on chronic diastolic CHF: continue on coreg, aldactone, lisinopril & torsemide. Monitor I/Os. Repeat echo shows EF 40-45%, LV global hypokinesis & grade II diastolic dysfunction. ?  ?DM2: likely poorly controlled. Continue on glargine, SSI w/ accuchecks  ? ?HTN: continue on coreg, lisinopril ?  ?CAP: continue on IV rocephin, azithromycin, bronchodilators & encourage incentive spirometry  ? ?Obesity: BMI 36.9. Would benefit from weight loss ? ?Hypokalemia: potassium given  ? ? ? ?DVT prophylaxis: lovenox  ?Code Status: full  ?Family Communication:  ?Disposition Plan: likely d/c back home  ? ?Level of care: Telemetry Cardiac ? ?Status is: Inpatient ?Remains inpatient appropriate because: requiring IV abxs & supplemental oxygen  ? ? ?Consultants:  ? ? ?Procedures:  ? ?Antimicrobials: rocephin, azithromycin  ? ? ?Subjective: ?Pt c/o shortness of breath  ? ?Objective: ?Vitals:  ? 01/31/22 0005 01/31/22 0319 01/31/22 0430 01/31/22 0731  ?BP: 113/80  122/76 114/73  ?Pulse: 89  87 79  ?Resp: 20  17 18   ?Temp: 97.8 ?F (36.6 ?C)  98.3 ?F (36.8 ?C) 98.1 ?F (36.7 ?C)  ?TempSrc:   Oral   ?SpO2: 100%  100% 100%  ?Weight:  120 kg    ?Height:      ? ? ?Intake/Output Summary (Last 24 hours) at 01/31/2022 0834 ?Last data filed at 01/31/2022 0507 ?Gross per 24 hour  ?Intake 123.35 ml  ?Output 900 ml  ?Net -776.65 ml  ? ?Filed Weights  ? 01/30/22 0315 01/31/22 0319  ?Weight:  119.7 kg 120 kg  ? ? ?Examination: ? ?General exam: Appears calm and comfortable  ?Respiratory system: course breath sounds b/l  ?Cardiovascular system: S1 & S2 +. No rubs, gallops or clicks.  ?Gastrointestinal system: Abdomen is obese, soft and nontender.  Normal bowel sounds heard. ?Central nervous system: Alert and oriented. Moves all extremities  ?Psychiatry: Judgement and insight appear normal. Flat mood and affect  ? ? ? ?Data Reviewed: I have personally reviewed following labs and imaging studies ? ?CBC: ?Recent Labs  ?Lab 01/30/22 ?0326 01/31/22 ?0629  ?WBC 11.6* 8.1  ?NEUTROABS 7.7  --   ?HGB 15.5* 13.6  ?HCT 48.3* 41.8  ?MCV 88.5 89.5  ?PLT 393 283  ? ?Basic Metabolic Panel: ?Recent Labs  ?Lab 01/30/22 ?0326 01/31/22 ?0629  ?NA 137 136  ?K 3.6 3.4*  ?CL 102 105  ?CO2 25 25  ?GLUCOSE 193* 215*  ?BUN 11 14  ?CREATININE 0.87 0.95  ?CALCIUM 9.3 8.2*  ? ?GFR: ?Estimated Creatinine Clearance: 91.1 mL/min (by C-G formula based on SCr of 0.95 mg/dL). ?Liver Function Tests: ?No results for input(s): AST, ALT, ALKPHOS, BILITOT, PROT, ALBUMIN in the last 168 hours. ?No results for input(s): LIPASE, AMYLASE in the last 168 hours. ?No results for input(s): AMMONIA in the last 168 hours. ?Coagulation Profile: ?No results for input(s): INR, PROTIME in the last 168 hours. ?Cardiac Enzymes: ?No results for input(s): CKTOTAL, CKMB, CKMBINDEX, TROPONINI in the last 168 hours. ?BNP (last 3  results) ?No results for input(s): PROBNP in the last 8760 hours. ?HbA1C: ?Recent Labs  ?  01/30/22 ?2110  ?HGBA1C 8.7*  ? ?CBG: ?Recent Labs  ?Lab 01/30/22 ?1126 01/30/22 ?1648 01/30/22 ?2025 01/31/22 ?0007 01/31/22 ?0730  ?GLUCAP 186* 181* 299* 226* 192*  ? ?Lipid Profile: ?No results for input(s): CHOL, HDL, LDLCALC, TRIG, CHOLHDL, LDLDIRECT in the last 72 hours. ?Thyroid Function Tests: ?No results for input(s): TSH, T4TOTAL, FREET4, T3FREE, THYROIDAB in the last 72 hours. ?Anemia Panel: ?No results for input(s): VITAMINB12, FOLATE,  FERRITIN, TIBC, IRON, RETICCTPCT in the last 72 hours. ?Sepsis Labs: ?Recent Labs  ?Lab 01/30/22 ?0326  ?PROCALCITON <0.10  ? ? ?Recent Results (from the past 240 hour(s))  ?Resp Panel by RT-PCR (Flu A&B, Covid) Nasopharyngeal Swab     Status: None  ? Collection Time: 01/30/22  3:26 AM  ? Specimen: Nasopharyngeal Swab; Nasopharyngeal(NP) swabs in vial transport medium  ?Result Value Ref Range Status  ? SARS Coronavirus 2 by RT PCR NEGATIVE NEGATIVE Final  ?  Comment: (NOTE) ?SARS-CoV-2 target nucleic acids are NOT DETECTED. ? ?The SARS-CoV-2 RNA is generally detectable in upper respiratory ?specimens during the acute phase of infection. The lowest ?concentration of SARS-CoV-2 viral copies this assay can detect is ?138 copies/mL. A negative result does not preclude SARS-Cov-2 ?infection and should not be used as the sole basis for treatment or ?other patient management decisions. A negative result may occur with  ?improper specimen collection/handling, submission of specimen other ?than nasopharyngeal swab, presence of viral mutation(s) within the ?areas targeted by this assay, and inadequate number of viral ?copies(<138 copies/mL). A negative result must be combined with ?clinical observations, patient history, and epidemiological ?information. The expected result is Negative. ? ?Fact Sheet for Patients:  ?EntrepreneurPulse.com.au ? ?Fact Sheet for Healthcare Providers:  ?IncredibleEmployment.be ? ?This test is no t yet approved or cleared by the [place] FDA and  ?has been authorized for detection and/or diagnosis of SARS-CoV-2 by ?FDA under an Emergency Use Authorization (EUA). This EUA will remain  ?in effect (meaning this test can be used) for the duration of the ?COVID-19 declaration under Section 564(b)(1) of the Act, 21 ?U.S.C.section 360bbb-3(b)(1), unless the authorization is terminated  ?or revoked sooner.  ? ? ?  ? Influenza A by PCR NEGATIVE NEGATIVE Final  ?  Influenza B by PCR NEGATIVE NEGATIVE Final  ?  Comment: (NOTE) ?The Xpert Xpress SARS-CoV-2/FLU/RSV plus assay is intended as an aid ?in the diagnosis of influenza from Nasopharyngeal swab specimens and ?should not be used as a sole basis for treatment. Nasal washings and ?aspirates are unacceptable for Xpert Xpress SARS-CoV-2/FLU/RSV ?testing. ? ?Fact Sheet for Patients: ?EntrepreneurPulse.com.au ? ?Fact Sheet for Healthcare Providers: ?IncredibleEmployment.be ? ?This test is not yet approved or cleared by the [place] FDA and ?has been authorized for detection and/or diagnosis of SARS-CoV-2 by ?FDA under an Emergency Use Authorization (EUA). This EUA will remain ?in effect (meaning this test can be used) for the duration of the ?COVID-19 declaration under Section 564(b)(1) of the Act, 21 U.S.C. ?section 360bbb-3(b)(1), unless the authorization is terminated or ?revoked. ? ?Performed at [facility], [place], ?[place] [address] ?  ?Blood culture (routine x 2)     Status: None (Preliminary result)  ? Collection Time: 01/30/22  3:26 AM  ? Specimen: BLOOD  ?Result Value Ref Range Status  ? Specimen Description BLOOD LEFT AC  Final  ? Special Requests   Final  ?  BOTTLES DRAWN AEROBIC  AND ANAEROBIC Blood Culture results may not be optimal due to an excessive volume of blood received in culture bottles  ? Culture   Final  ?  NO GROWTH 1 DAY ?Performed at [facility], [address] [place], [place] [address] ?  ? Report Status PENDING  Incomplete  ?Blood culture (routine x 2)     Status: None (Preliminary result)  ? Collection Time: 01/30/22  8:55 AM  ? Specimen: BLOOD  ?Result Value Ref Range Status  ? Specimen Description BLOOD RIGHT ANTECUBITAL  Final  ? Special Requests   Final  ?  BOTTLES DRAWN AEROBIC AND ANAEROBIC Blood Culture adequate volume  ? Culture   Final  ?  NO GROWTH < 24 HOURS ?Performed at [facility], [address], [place], [place] [address] ?  ? Report Status PENDING  Incomplete  ?  ? ? ? ? ? ?Radiology Studies: ?CT CHEST WO CONTRAST ? ?Result Date: 01/30/2022 ?CLINICAL DATA:  Increasing shortness of breath and hemopty

## 2022-01-31 NOTE — Progress Notes (Signed)
Patient states pain is 5/10. Vitals after 2nd administration of Nitro ? 01/31/22 2140  ?Vitals  ?BP 109/69  ?MAP (mmHg) 82  ?BP Location Left Arm  ?BP Method Automatic  ?Patient Position (if appropriate) Lying  ?Pulse Rate 94  ?Pulse Rate Source Monitor  ?Resp 18  ?MEWS COLOR  ?MEWS Score Color Green  ?Oxygen Therapy  ?SpO2 95 %  ?O2 Device Nasal Cannula  ?O2 Flow Rate (L/min) 4 L/min  ?MEWS Score  ?MEWS Temp 0  ?MEWS Systolic 0  ?MEWS Pulse 0  ?MEWS RR 0  ?MEWS LOC 0  ?MEWS Score 0  ? ? ?

## 2022-01-31 NOTE — Progress Notes (Signed)
Patient complains of chest pain at 2125. ?EKG performed. NSR with Non-specific T wave abnormality. Copy in chart. ? ?Patient on 4L [place]. Nitro given. Vitals in chart (under 21:00 instead of 21:35 when Nitro was administered. Will check in 5 minutes) ? ?

## 2022-01-31 NOTE — Progress Notes (Signed)
Patient states pain is 0/10 after 3 administrations of Nitroglycerin 0.4mg  tab ?

## 2022-01-31 NOTE — Progress Notes (Signed)
Nutrition Brief Note ? ?RD pulled to chart secondary to CHF admission.  ? ?Wt Readings from Last 15 Encounters:  ?01/31/22 120 kg  ?07/26/20 127.9 kg  ? ?Emily Hinton is a 61 y.o. female with medical history significant for nonischemic cardiomyopathy (last known LVEF of 55% from 07/22), chronic diastolic dysfunction CHF, diabetes mellitus who presents to the ER for evaluation of worsening shortness of breath and a cough productive of sputum streaked with blood. ? ?Spoke with pt at bedside, who reports feeling tired secondary to respiratory compromise. Pt explains that she has CHF, however, this is not a new diagnosis for her. She recalls meeting with Heart Failure RN earlier today and was able to teachback to this RD what was discussed. She reports feeling comfortable making changes such as decreasing sodium and daily weights; pt shares that her father was hospitalized about a month ago due to CHF and ESRD and feels comfortable with these changes due to caring for him. She shares that her biggest challenge is preparing meals that suit him, her, and her two adult children who live in her home. RD also updated pt food preferences.  ? ?Body mass index is 36.9 kg/m?Marland Kitchen Patient meets criteria for obesity, class II based on current BMI. Obesity is a complex, chronic medical condition that is optimally managed by a multidisciplinary care team. Weight loss is not an ideal goal for an acute inpatient hospitalization. However, if further work-up for obesity is warranted, consider outpatient referral to outpatient bariatric service and/or [facility] Nutrition and Diabetes Education Services.   ? ?Current diet order is heart healthy/ carb modified, patient is consuming approximately 50-75% of meals at this time. Labs and medications reviewed.  ? ?No nutrition interventions warranted at this time. If nutrition issues arise, please consult RD.  ? ?Levada Schilling, RD, LDN, CDCES ?Registered Dietitian II ?Certified  Diabetes Care and Education Specialist ?Please refer to [facility] for RD and/or RD on-call/weekend/after hours pager   ?

## 2022-01-31 NOTE — Consult Note (Signed)
? ?  Heart Failure Nurse Navigator Note ? ?HrrEF 40-45%.  Grade II diastolic dysfunction.  Mild mitral regurgitation. Previously reported at 55%. ? ?Comorbidities: ? ?Nonischemic cardiomyopathy ?Diabetes ?Hypertension ?Sleep apnea ? ?Medications: ? ?Aspirin 81 mg daily ?Carvedilol 6.25 mg 2 times a day ?Lisinopril 40 mg daily ?Spironolactone 25 mg daily ?Torsemide 20 mg daily ? ?Labs: ? ? ?Sodium 136, potassium 3.4, chloride 105, CO2 25, BUN 14, creatinine 0.95, hemoglobin A1c 8.7, BNP 189. ?Weight 125 kg ?Blood pressure 128/82 ?Intake 123 mL ?Output 1900 mL ? ?Initial meeting with patient today she is lying in bed in no acute distress but currently on O2 per nasal cannula. ? ?States that she lives here and [place], coming from [place] 38 years ago.  She currently has her 2 sons and dad living with her.  States that she moved from [place] 3 years ago but she continues to go back up there for her doctors appointments it has something to do with insurance coverage. ? ?She continues to cook with salt, recommended removing salt from her diet.  Talked about seasonings that did not contain salt or sodium. ? ?She states that she was compliant with all of her medications. ? ?Discussed the importance of daily weight and what to report. ? ?Discussed daily weights and what to report. ? ?She states in the past she was diagnosed with sleep apnea and had a CPAP machine but due to not using it she was asked to turn it back into the company.  Discussed the importance of using her CPAP.  He voices understanding. ? ?She was given the living with heart failure teaching booklet, zone magnet, information on low-sodium and weight chart. ?Also discussed follow-up in the outpatient heart failure clinic for which she has an appointment on May 2 at 1 PM. ? ?Pricilla Riffle RN [facility] ?

## 2022-01-31 NOTE — Progress Notes (Signed)
Mobility Specialist - Progress Note ? ? 01/31/22 1600  ?Mobility  ?Activity Ambulated independently in hallway  ?Level of Assistance Standby assist, set-up cues, supervision of patient - no hands on  ?Assistive Device None  ?Distance Ambulated (ft) 180 ft  ?Activity Response Tolerated well  ?$Mobility charge 1 Mobility  ? ? ? ?Pre-mobility: 86 HR, 97% SpO2 ?During mobility: 105 HR, 98% SpO2 ? ?Pt supine upon arrival using 2L [place]. Pt completes bed mobility to sit EOB ModI + extra time and completes STS indep. Ambulates with supervision --- voicing tiredness after 1 lap around NS and returns to bed with needs in reach and RN  notified. ? ?Merrily Brittle ?Mobility Specialist ?01/31/22, 4:30 PM ? ? ? ? ?

## 2022-02-01 ENCOUNTER — Encounter: Payer: Self-pay | Admitting: Internal Medicine

## 2022-02-01 DIAGNOSIS — E1159 Type 2 diabetes mellitus with other circulatory complications: Secondary | ICD-10-CM

## 2022-02-01 DIAGNOSIS — J9601 Acute respiratory failure with hypoxia: Secondary | ICD-10-CM

## 2022-02-01 DIAGNOSIS — J189 Pneumonia, unspecified organism: Secondary | ICD-10-CM | POA: Diagnosis not present

## 2022-02-01 DIAGNOSIS — I420 Dilated cardiomyopathy: Secondary | ICD-10-CM

## 2022-02-01 DIAGNOSIS — I5033 Acute on chronic diastolic (congestive) heart failure: Secondary | ICD-10-CM | POA: Diagnosis not present

## 2022-02-01 LAB — CBC
HCT: 40.9 % (ref 36.0–46.0)
Hemoglobin: 13.2 g/dL (ref 12.0–15.0)
MCH: 28.1 pg (ref 26.0–34.0)
MCHC: 32.3 g/dL (ref 30.0–36.0)
MCV: 87.2 fL (ref 80.0–100.0)
Platelets: 288 10*3/uL (ref 150–400)
RBC: 4.69 MIL/uL (ref 3.87–5.11)
RDW: 14.1 % (ref 11.5–15.5)
WBC: 8.4 10*3/uL (ref 4.0–10.5)
nRBC: 0 % (ref 0.0–0.2)

## 2022-02-01 LAB — GLUCOSE, CAPILLARY
Glucose-Capillary: 222 mg/dL — ABNORMAL HIGH (ref 70–99)
Glucose-Capillary: 152 mg/dL — ABNORMAL HIGH (ref 70–99)
Glucose-Capillary: 243 mg/dL — ABNORMAL HIGH (ref 70–99)
Glucose-Capillary: 191 mg/dL — ABNORMAL HIGH (ref 70–99)
Glucose-Capillary: 217 mg/dL — ABNORMAL HIGH (ref 70–99)

## 2022-02-01 LAB — BASIC METABOLIC PANEL
Anion gap: 5 (ref 5–15)
BUN: 16 mg/dL (ref 6–20)
CO2: 26 mmol/L (ref 22–32)
Calcium: 8.5 mg/dL — ABNORMAL LOW (ref 8.9–10.3)
Chloride: 104 mmol/L (ref 98–111)
Creatinine, Ser: 0.93 mg/dL (ref 0.44–1.00)
GFR, Estimated: >60 mL/min (ref >60)
Glucose, Bld: 201 mg/dL — ABNORMAL HIGH (ref 70–99)
Potassium: 3.8 mmol/L (ref 3.5–5.1)
Sodium: 135 mmol/L (ref 135–145)

## 2022-02-01 MED ORDER — LOSARTAN POTASSIUM 25 MG PO TABS
25.0000 mg | ORAL_TABLET | Freq: Every day | ORAL | Status: DC
  Administered 2022-02-02 – 2022-02-03 (×2): 25 mg via ORAL
  Filled 2022-02-01 (×2): qty 1

## 2022-02-01 NOTE — Progress Notes (Signed)
Patient was placed on 4L after chest pain. Chest pain subsided after 3 rounds of Nitroglycerin. O2 Saturation in the high 90's.  ? ?Oxygen decreased to previous baseline of 2L ?

## 2022-02-01 NOTE — Progress Notes (Signed)
Patient is 100% Sat at 2L. Decreased oxygen to 1L [place] ?

## 2022-02-01 NOTE — Progress Notes (Signed)
Mobility Specialist - Progress Note ? ? ? 02/01/22 1500  ?Mobility  ?Activity Ambulated independently in hallway;Stood at bedside;Dangled on edge of bed  ?Level of Assistance Standby assist, set-up cues, supervision of patient - no hands on  ?Assistive Device None  ?Distance Ambulated (ft) 180 ft  ?Activity Response Tolerated well  ?$Mobility charge 1 Mobility  ? ? ?Pre-mobility: 95 HR, 96% SpO2 ?During mobility: 98 HR, 97% SpO2 ?Post-mobility: 85 HR, 99% SPO2 ? ?Pt supine upon arrival using 1L. Pt completes bed mobility with MODI and STS indep. Completes ambulation with supervision --- 1 pt initiated break against the wall d/t SOB but motivated to continue. Tolerated well and returns to bed with needs in reach. ? ?Clarisa Schools ?Mobility Specialist ?02/01/22, 3:29 PM ? ? ? ?

## 2022-02-01 NOTE — Consult Note (Signed)
? ? ? ?Cardiology Consultation:  ? ?Patient ID: Emily Hinton; [id number]; [birth year]  ? ?Admit date: 01/30/2022 ?Date of Consult: 02/01/2022 ? ?Primary Care Provider: Patient, No Pcp Per (Inactive) ?Primary Cardiologist: New to [facility] - consult by Mariah Milling (previously followed in [place]) ?Primary Electrophysiologist:  None ? ? ?Patient Profile:  ? ?Emily Hinton is a 61 y.o. 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 Mayford Knife. ? ?History of Present Illness:  ? ?Emily Hinton was previously followed by cardiology in [place]. She was found to have a cardiomyopathy in 2018 with an EF of 45%. LHC at that time showed nonobstructive coronary arteries. Echo in 05/2018 demonstrated a persistent cardiomyopathy with an EF of 45%, global hypokinesis, and diastolic dysfunction. This finding was unchanged when compared to echo in 2018.  Subsequent echo in 04/2021 showed an improved LV systolic function with an EF of 55%, probable normal LV wall motion, and diastolic dysfunction. She moved to [facility] from [place] about 2.5 years ago and has not yet established with cardiology.  ? ?She was admitted to [facility] on 01/30/2022 with a 2 day history of worsening dyspnea and cough productive of sputum with some blood tinge without fever or chills and found to have multilobar PNA with mild volume overload. Hypertensive and tachycardic with oxygen saturation of 72% on room air upon presentation. High sensitivity troponin 35 with a delta troponin of 20. BNP 189. CXR showed possible volume overload vs PNA and small pleural effusions. CT chest showed worsening multilobar PNA with minimal  pleural fluid on the left. Echo during this admission showed an EF of 40-45%, mildly to moderately dilated LV internal cavity size, global hypokinesis, Gr2DD, normal RV systolic function and ventricular cavity size, and mild mitral regurgitation. From a cardiac perspective, she has been receiving Coreg, lisinopril, and spironolactone with IV Lasix 40 mg daily having been started earlier today. Documented UOP 4.5 L for the admission. She had an episode of chest discomfort overnight following a coughing episodes, otherwise, she has been without chest pain. Cough persists, though dyspnea is improving.  ? ?Past Medical History:  ?Diagnosis Date  ? CSF leak   ? Diabetes mellitus without complication (HCC)   ? DM2 (diabetes mellitus, type 2) (HCC)   ? Essential hypertension   ? Hyperlipidemia   ? Morbid obesity (HCC)   ? NICM (nonischemic cardiomyopathy) (HCC)   ? Nonobstructive atherosclerosis of coronary artery   ? OSA (obstructive sleep apnea)   ? ? ?History reviewed. No pertinent surgical history.  ? ?Home Meds: ?Prior to Admission medications   ?Medication Sig Start Date End Date Taking? Authorizing Provider  ?amLODipine (NORVASC) 2.5 MG tablet Take 2.5 mg by mouth daily. 09/15/21  Yes [provider]  ?aspirin 81 MG chewable tablet Chew 81 mg by mouth daily. 09/15/21  Yes [provider]  ?carvedilol (COREG) 6.25 MG tablet Take 1 tablet by mouth 2 (two) times daily. 01/16/22  Yes [provider]  ?empagliflozin (JARDIANCE) 25 MG TABS tablet Take 25 mg by mouth daily. 09/15/21 03/14/22 Yes [provider]  ?gabapentin (NEURONTIN) 600 MG tablet Take 600 mg by mouth 3 (  three) times daily.   Yes [provider]  ?insulin detemir (LEVEMIR) 100 UNIT/ML injection Inject 35 Units into the skin at bedtime.   Yes [provider]  ?lisinopril (ZESTRIL) 40 MG tablet Take 40 mg by mouth daily. 09/15/21  Yes [provider]  ?metFORMIN (GLUCOPHAGE) 1000 MG tablet Take 1,000  mg by mouth 2 (two) times daily with a meal.   Yes [provider]  ?omeprazole (PRILOSEC) 20 MG capsule Take 20 mg by mouth daily.   Yes [provider]  ?Semaglutide (OZEMPIC, 0.25 OR 0.5 MG/DOSE, [place]) Inject 0.5 mg into the skin once a week.   Yes [provider]  ?spironolactone (ALDACTONE) 25 MG tablet Take 25 mg by mouth daily.   Yes [provider]  ?torsemide (DEMADEX) 20 MG tablet Take 20 mg by mouth daily. 01/16/22  Yes [provider]  ? ? ?Inpatient Medications: ?Scheduled Meds: ? aspirin  81 mg Oral Daily  ? carvedilol  6.25 mg Oral BID  ? enoxaparin (LOVENOX) injection  0.5 mg/kg Subcutaneous Q24H  ? furosemide  40 mg Intravenous Daily  ? gabapentin  600 mg Oral TID  ? guaiFENesin  1,200 mg Oral BID  ? insulin aspart  0-15 Units Subcutaneous TID WC  ? insulin detemir  35 Units Subcutaneous QHS  ? lisinopril  40 mg Oral Daily  ? pantoprazole  40 mg Oral Daily  ? sodium chloride flush  3 mL Intravenous Q12H  ? spironolactone  25 mg Oral Daily  ? ?Continuous Infusions: ? sodium chloride    ? azithromycin (ZITHROMAX) 500 MG IVPB (Vial-Mate [employer]) 500 mg (02/01/22 0408)  ? cefTRIAXone (ROCEPHIN)  IV 2 g (02/01/22 0537)  ? ?PRN Meds: ?sodium chloride, acetaminophen **OR** acetaminophen, ipratropium-albuterol, nitroGLYCERIN, sodium chloride flush ? ?Allergies:   ?Allergies  ?Allergen Reactions  ? Cashew Nut Oil Hives, Itching and Rash  ? ? ?Social History:   ?Social History  ? ?Socioeconomic History  ? Marital status: Divorced  ?  Spouse name: Not on file  ? Number of children: Not on file  ? Years of education: Not on file  ? Highest education level: Not on file  ?Occupational History  ? Not on file  ?Tobacco Use  ? Smoking status: Not on file  ? Smokeless tobacco: Not on file  ?Substance and Sexual Activity  ? Alcohol use: Not on file  ? Drug use: Not on file  ? Sexual activity: Not on file  ?Other Topics Concern  ? Not on file  ?Social History Narrative  ? Not on  file  ? ?Social Determinants of Health  ? ?Financial Resource Strain: Not on file  ?Food Insecurity: Not on file  ?Transportation Needs: Not on file  ?Physical Activity: Not on file  ?Stress: Not on file  ?Social Connections: Not on file  ?Intimate Partner Violence: Not on file  ?  ? ?Family History:   ?History reviewed. No pertinent family history. ?Unknown with family in [place].  ? ?ROS:  ?Review of Systems  ?Constitutional:  Positive for malaise/fatigue. Negative for chills, diaphoresis, fever and weight loss.  ?HENT:  Negative for congestion.   ?Eyes:  Negative for discharge and redness.  ?Respiratory:  Positive for cough, hemoptysis, sputum production and shortness of breath. Negative for wheezing.   ?Cardiovascular:  Positive for chest pain and orthopnea. Negative for palpitations, claudication, leg swelling and PND.  ?Gastrointestinal:  Negative for abdominal pain, heartburn, nausea and vomiting.  ?Musculoskeletal:  Negative for falls and myalgias.  ?  Skin:  Negative for rash.  ?Neurological:  Positive for weakness. Negative for dizziness, tingling, tremors, sensory change, speech change, focal weakness and loss of consciousness.  ?Endo/Heme/Allergies:  Does not bruise/bleed easily.  ?Psychiatric/Behavioral:  Negative for substance abuse. The patient is not nervous/anxious.   ?All other systems reviewed and are negative.   ? ?Physical Exam/Data:  ? ?Vitals:  ? 02/01/22 0348 02/01/22 0500 02/01/22 0710 02/01/22 0934  ?BP: 107/75  103/69 109/77  ?Pulse: 79  80 87  ?Resp: 17  16 16   ?Temp: 98.5 ?F (36.9 ?C)  97.6 ?F (36.4 ?C)   ?TempSrc: Oral  Oral   ?SpO2: 100%  99% 100%  ?Weight:  121.4 kg    ?Height:      ? ? ?Intake/Output Summary (Last 24 hours) at 02/01/2022 1043 ?Last data filed at 02/01/2022 6384 ?Gross per 24 hour  ?Intake 720 ml  ?Output 2900 ml  ?Net -2180 ml  ? ?Filed Weights  ? 01/30/22 0315 01/31/22 0319 02/01/22 0500  ?Weight: 119.7 kg 120 kg 121.4 kg  ? ?Body mass index is 37.33 kg/m?.  ? ?Physical  Exam: ?General: Well developed, well nourished, in no acute distress. ?Head: Normocephalic, atraumatic, sclera non-icteric, no xanthomas, nares without discharge.  ?Neck: Negative for carotid bruits.

## 2022-02-01 NOTE — Progress Notes (Signed)
?PROGRESS NOTE ? ? ? ?Emily Hinton  [id number] DOB: 1961-02-07 DOA: 01/30/2022 ?PCP: Patient, No Pcp Per (Inactive)  ? ?Assessment & Plan: ?  ?Principal Problem: ?  Acute respiratory failure ([place]) ?Active Problems: ?  Acute on chronic diastolic CHF (congestive heart failure) ([place]) ?  Diabetes mellitus ([place]) ?  Essential hypertension ?  CAP (community acquired pneumonia) ? ? ?Acute hypoxic respiratory failure: likely secondary to PNA & CHF. 72% on RA. Continue on supplemental oxygen and wean as tolerated. Continue on IV abxs & torsemide switch to IV lasix.  ?  ?Acute on chronic diastolic CHF: continue on coreg, aldactone. D/c lisinopril & start losartan. Monitor I/Os. Repeat echo shows EF 40-45%, LV global hypokinesis & grade II diastolic dysfunction. Cardio consulted and recs apprec ? ?CAP: continue on IV rocephin, azithromycin, bronchodilators & encourage incentive spirometry  ?  ?DM2: HbA1c 8.7, poorly controlled. Continue on glargine, SSI w/ accuchecks  ? ?HTN: continue on coreg. D/c lisinopril and start losartan as per cardio  ? ?Obesity: BMI 37.3. Would benefit from weight loss  ? ?Hypokalemia: WNL today  ? ?Hx of OSA: does not use CPAP at home  ? ? ? ?DVT prophylaxis: lovenox  ?Code Status: full  ?Family Communication:  ?Disposition Plan: likely d/c back home  ? ?Level of care: Telemetry Cardiac ? ?Status is: Inpatient ?Remains inpatient appropriate because: requiring IV abxs & supplemental oxygen  ? ? ?Consultants:  ? ? ?Procedures:  ? ?Antimicrobials: rocephin, azithromycin  ? ? ?Subjective: ?Pt c/o shortness of breath again today, slightly improved from day prior  ? ?Objective: ?Vitals:  ? 01/31/22 2349 02/01/22 0348 02/01/22 0500 02/01/22 0710  ?BP: 108/64 107/75  103/69  ?Pulse: 93 79  80  ?Resp: 20 17  17   ?Temp: 98.5 ?F (36.9 ?C) 98.5 ?F (36.9 ?C)  97.6 ?F (36.4 ?C)  ?TempSrc:  Oral  Oral  ?SpO2: 98% 100%  99%  ?Weight:   121.4 kg   ?Height:      ? ? ?Intake/Output Summary (Last 24  hours) at 02/01/2022 0743 ?Last data filed at 02/01/2022 0408 ?Gross per 24 hour  ?Intake 1186.68 ml  ?Output 3250 ml  ?Net -2063.32 ml  ? ?Filed Weights  ? 01/30/22 0315 01/31/22 0319 02/01/22 0500  ?Weight: 119.7 kg 120 kg 121.4 kg  ? ? ?Examination: ? ?General exam: Appears comfortable. Obese ?Respiratory system: diminished breath sounds b/l  ?Cardiovascular system: S1/S2+. No rubs or clicks   ?Gastrointestinal system: Abd is soft, NT, obese & normal bowel sounds  ?Central nervous system: Alert and oriented. Moves all extremities  ?Psychiatry: Judgement and insight appears normal. Flat mood and affect  ? ? ? ?Data Reviewed: I have personally reviewed following labs and imaging studies ? ?CBC: ?Recent Labs  ?Lab 01/30/22 ?0326 01/31/22 ?0629 02/01/22 ?0527  ?WBC 11.6* 8.1 8.4  ?NEUTROABS 7.7  --   --   ?HGB 15.5* 13.6 13.2  ?HCT 48.3* 41.8 40.9  ?MCV 88.5 89.5 87.2  ?PLT 393 283 288  ? ?Basic Metabolic Panel: ?Recent Labs  ?Lab 01/30/22 ?0326 01/31/22 ?0629 02/01/22 ?0527  ?NA 137 136 135  ?K 3.6 3.4* 3.8  ?CL 102 105 104  ?CO2 25 25 26   ?GLUCOSE 193* 215* 201*  ?BUN 11 14 16   ?CREATININE 0.87 0.95 0.93  ?CALCIUM 9.3 8.2* 8.5*  ? ?GFR: ?Estimated Creatinine Clearance: 93.6 mL/min (by C-G formula based on SCr of 0.93 mg/dL). ?Liver Function Tests: ?No results for input(s): AST, ALT, ALKPHOS, BILITOT, PROT, ALBUMIN  in the last 168 hours. ?No results for input(s): LIPASE, AMYLASE in the last 168 hours. ?No results for input(s): AMMONIA in the last 168 hours. ?Coagulation Profile: ?No results for input(s): INR, PROTIME in the last 168 hours. ?Cardiac Enzymes: ?No results for input(s): CKTOTAL, CKMB, CKMBINDEX, TROPONINI in the last 168 hours. ?BNP (last 3 results) ?No results for input(s): PROBNP in the last 8760 hours. ?HbA1C: ?Recent Labs  ?  01/30/22 ?2110  ?HGBA1C 8.7*  ? ?CBG: ?Recent Labs  ?Lab 01/31/22 ?0730 01/31/22 ?1117 01/31/22 ?1603 01/31/22 ?2053 02/01/22 ?0242  ?GLUCAP 192* 204* 156* 236* 222*  ? ?Lipid  Profile: ?No results for input(s): CHOL, HDL, LDLCALC, TRIG, CHOLHDL, LDLDIRECT in the last 72 hours. ?Thyroid Function Tests: ?No results for input(s): TSH, T4TOTAL, FREET4, T3FREE, THYROIDAB in the last 72 hours. ?Anemia Panel: ?No results for input(s): VITAMINB12, FOLATE, FERRITIN, TIBC, IRON, RETICCTPCT in the last 72 hours. ?Sepsis Labs: ?Recent Labs  ?Lab 01/30/22 ?0326  ?PROCALCITON <0.10  ? ? ?Recent Results (from the past 240 hour(s))  ?Resp Panel by RT-PCR (Flu A&B, Covid) Nasopharyngeal Swab     Status: None  ? Collection Time: 01/30/22  3:26 AM  ? Specimen: Nasopharyngeal Swab; Nasopharyngeal(NP) swabs in vial transport medium  ?Result Value Ref Range Status  ? SARS Coronavirus 2 by RT PCR NEGATIVE NEGATIVE Final  ?  Comment: (NOTE) ?SARS-CoV-2 target nucleic acids are NOT DETECTED. ? ?The SARS-CoV-2 RNA is generally detectable in upper respiratory ?specimens during the acute phase of infection. The lowest ?concentration of SARS-CoV-2 viral copies this assay can detect is ?138 copies/mL. A negative result does not preclude SARS-Cov-2 ?infection and should not be used as the sole basis for treatment or ?other patient management decisions. A negative result may occur with  ?improper specimen collection/handling, submission of specimen other ?than nasopharyngeal swab, presence of viral mutation(s) within the ?areas targeted by this assay, and inadequate number of viral ?copies(<138 copies/mL). A negative result must be combined with ?clinical observations, patient history, and epidemiological ?information. The expected result is Negative. ? ?Fact Sheet for Patients:  ?EntrepreneurPulse.com.au ? ?Fact Sheet for Healthcare Providers:  ?IncredibleEmployment.be ? ?This test is no t yet approved or cleared by the [place] FDA and  ?has been authorized for detection and/or diagnosis of SARS-CoV-2 by ?FDA under an Emergency Use Authorization (EUA). This EUA will remain  ?in  effect (meaning this test can be used) for the duration of the ?COVID-19 declaration under Section 564(b)(1) of the Act, 21 ?U.S.C.section 360bbb-3(b)(1), unless the authorization is terminated  ?or revoked sooner.  ? ? ?  ? Influenza A by PCR NEGATIVE NEGATIVE Final  ? Influenza B by PCR NEGATIVE NEGATIVE Final  ?  Comment: (NOTE) ?The Xpert Xpress SARS-CoV-2/FLU/RSV plus assay is intended as an aid ?in the diagnosis of influenza from Nasopharyngeal swab specimens and ?should not be used as a sole basis for treatment. Nasal washings and ?aspirates are unacceptable for Xpert Xpress SARS-CoV-2/FLU/RSV ?testing. ? ?Fact Sheet for Patients: ?EntrepreneurPulse.com.au ? ?Fact Sheet for Healthcare Providers: ?IncredibleEmployment.be ? ?This test is not yet approved or cleared by the [place] FDA and ?has been authorized for detection and/or diagnosis of SARS-CoV-2 by ?FDA under an Emergency Use Authorization (EUA). This EUA will remain ?in effect (meaning this test can be used) for the duration of the ?COVID-19 declaration under Section 564(b)(1) of the Act, 21 U.S.C. ?section 360bbb-3(b)(1), unless the authorization is terminated or ?revoked. ? ?Performed at [facility], [place], ?  [place] [address] ?  ?Blood culture (routine x 2)     Status: None (Preliminary result)  ? Collection Time: 01/30/22  3:26 AM  ? Specimen: BLOOD  ?Result Value Ref Range Status  ? Specimen Description BLOOD LEFT AC  Final  ? Special Requests   Final  ?  BOTTLES DRAWN AEROBIC AND ANAEROBIC Blood Culture results may not be optimal due to an excessive volume of blood received in culture bottles  ? Culture   Final  ?  NO GROWTH 1 DAY ?Performed at [facility], [address], [place], [place] [address] ?  ? Report Status PENDING  Incomplete  ?Blood culture (routine x 2)     Status: None (Preliminary result)  ? Collection Time: 01/30/22  8:55 AM  ? Specimen: BLOOD  ?Result  Value Ref Range Status  ? Specimen Description BLOOD RIGHT ANTECUBITAL  Final  ? Special Requests   Final  ?  BOTTLES DRAWN AEROBIC AND ANAEROBIC Blood Culture adequate volume  ? Culture   Final  ?  NO GR

## 2022-02-02 DIAGNOSIS — I5033 Acute on chronic diastolic (congestive) heart failure: Secondary | ICD-10-CM | POA: Diagnosis not present

## 2022-02-02 DIAGNOSIS — J9601 Acute respiratory failure with hypoxia: Secondary | ICD-10-CM | POA: Diagnosis not present

## 2022-02-02 DIAGNOSIS — J189 Pneumonia, unspecified organism: Secondary | ICD-10-CM | POA: Diagnosis not present

## 2022-02-02 LAB — BASIC METABOLIC PANEL
Anion gap: 9 (ref 5–15)
BUN: 21 mg/dL — ABNORMAL HIGH (ref 6–20)
CO2: 26 mmol/L (ref 22–32)
Calcium: 8.9 mg/dL (ref 8.9–10.3)
Chloride: 103 mmol/L (ref 98–111)
Creatinine, Ser: 1.07 mg/dL — ABNORMAL HIGH (ref 0.44–1.00)
GFR, Estimated: 60 mL/min — ABNORMAL LOW (ref >60)
Glucose, Bld: 227 mg/dL — ABNORMAL HIGH (ref 70–99)
Potassium: 4.3 mmol/L (ref 3.5–5.1)
Sodium: 138 mmol/L (ref 135–145)

## 2022-02-02 LAB — CBC
HCT: 42.4 % (ref 36.0–46.0)
Hemoglobin: 13.8 g/dL (ref 12.0–15.0)
MCH: 28.2 pg (ref 26.0–34.0)
MCHC: 32.5 g/dL (ref 30.0–36.0)
MCV: 86.7 fL (ref 80.0–100.0)
Platelets: 310 10*3/uL (ref 150–400)
RBC: 4.89 MIL/uL (ref 3.87–5.11)
RDW: 14 % (ref 11.5–15.5)
WBC: 7.2 10*3/uL (ref 4.0–10.5)
nRBC: 0 % (ref 0.0–0.2)

## 2022-02-02 LAB — GLUCOSE, CAPILLARY
Glucose-Capillary: 192 mg/dL — ABNORMAL HIGH (ref 70–99)
Glucose-Capillary: 163 mg/dL — ABNORMAL HIGH (ref 70–99)
Glucose-Capillary: 166 mg/dL — ABNORMAL HIGH (ref 70–99)
Glucose-Capillary: 223 mg/dL — ABNORMAL HIGH (ref 70–99)

## 2022-02-02 NOTE — Progress Notes (Signed)
?PROGRESS NOTE ? ? ? ?Zai Lyndon  [id number] DOB: [birth year] DOA: 01/30/2022 ?PCP: Patient, No Pcp Per (Inactive)  ? ?Assessment & Plan: ?  ?Principal Problem: ?  Acute respiratory failure ([place]) ?Active Problems: ?  Acute on chronic diastolic CHF (congestive heart failure) ([place]) ?  Diabetes mellitus ([place]) ?  Essential hypertension ?  CAP (community acquired pneumonia) ? ? ?Acute hypoxic respiratory failure: likely secondary to PNA & CHF. 72% on RA. Weaned off of supplemental oxygen today. Continue on IV abxs & IV lasix  ?  ?Acute on chronic diastolic CHF: continue on coreg, aldactone & losartan as per cardio. D/c lisinopril. Continue on IV lasix. Monitor I/Os. Cardio following recs apprec. Repeat echo shows EF 40-45%, LV global hypokinesis & grade II diastolic dysfunction  ? ?CAP: continue on IV ceftriaxone, azithromycin, bronchodilators & encourage incentive spirometry  ?  ?DM2: poorly controlled, HbA1c 8.7. Continue on glargine, SSI w/ accuchecks ? ?HTN: continue on coreg & losartan as per cardio. D/c lisinopril  ? ?Obesity: BMI 37.9. Would benefit from weight loss ? ?Hypokalemia: WNL  ? ?Hx of OSA: does not use CPAP at home ? ? ? ? ?DVT prophylaxis: lovenox  ?Code Status: full  ?Family Communication:  ?Disposition Plan: likely d/c back home  ? ?Level of care: Telemetry Cardiac ? ?Status is: Inpatient ?Remains inpatient appropriate because: requiring IV abxs  ? ?Consultants:  ? ? ?Procedures:  ? ?Antimicrobials: rocephin, azithromycin  ? ? ?Subjective: ?Pt c/o improved shortness of breath.  ? ?Objective: ?Vitals:  ? 02/01/22 2058 02/02/22 0007 02/02/22 0142 02/02/22 0424  ?BP: 120/81 115/79  118/73  ?Pulse: 90 88  73  ?Resp: 18 16  18   ?Temp: 98.4 ?F (36.9 ?C) 98 ?F (36.7 ?C)  98.4 ?F (36.9 ?C)  ?TempSrc:      ?SpO2: 99% 100%  100%  ?Weight:   123.3 kg   ?Height:      ? ? ?Intake/Output Summary (Last 24 hours) at 02/02/2022 0732 ?Last data filed at 02/02/2022 0400 ?Gross per 24 hour  ?Intake 6 ml   ?Output 2550 ml  ?Net -2544 ml  ? ?Filed Weights  ? 01/31/22 0319 02/01/22 0500 02/02/22 0142  ?Weight: 120 kg 121.4 kg 123.3 kg  ? ? ?Examination: ? ?General exam: Appears calm & comfortable. Obese ?Respiratory system: decreased breath sounds b/l  ?Cardiovascular system: S1&S2+. No rubs or gallops   ?Gastrointestinal system: Abd is soft, NT, obese & normal bowel sounds ?Central nervous system: Alert and oriented. Moves all extremities  ?Psychiatry: Judgement and insight appears normal. Flat mood and affect  ? ? ? ?Data Reviewed: I have personally reviewed following labs and imaging studies ? ?CBC: ?Recent Labs  ?Lab 01/30/22 ?0326 01/31/22 ?0629 02/01/22 ?[id number] 02/02/22 ?0310  ?WBC 11.6* 8.1 8.4 7.2  ?NEUTROABS 7.7  --   --   --   ?HGB 15.5* 13.6 13.2 13.8  ?HCT 48.3* 41.8 40.9 42.4  ?MCV 88.5 89.5 87.2 86.7  ?PLT 393 283 288 310  ? ?Basic Metabolic Panel: ?Recent Labs  ?Lab 01/30/22 ?0326 01/31/22 ?0629 02/01/22 ?[id number] 02/02/22 ?0310  ?NA 137 136 135 138  ?K 3.6 3.4* 3.8 4.3  ?CL 102 105 104 103  ?CO2 25 25 26 26   ?GLUCOSE 193* 215* 201* 227*  ?BUN 11 14 16  21*  ?CREATININE 0.87 0.95 0.93 1.07*  ?CALCIUM 9.3 8.2* 8.5* 8.9  ? ?GFR: ?Estimated Creatinine Clearance: 82 mL/min (A) (by C-G formula based on SCr of 1.07 mg/dL (H)). ?Liver Function Tests: ?  No results for input(s): AST, ALT, ALKPHOS, BILITOT, PROT, ALBUMIN in the last 168 hours. ?No results for input(s): LIPASE, AMYLASE in the last 168 hours. ?No results for input(s): AMMONIA in the last 168 hours. ?Coagulation Profile: ?No results for input(s): INR, PROTIME in the last 168 hours. ?Cardiac Enzymes: ?No results for input(s): CKTOTAL, CKMB, CKMBINDEX, TROPONINI in the last 168 hours. ?BNP (last 3 results) ?No results for input(s): PROBNP in the last 8760 hours. ?HbA1C: ?Recent Labs  ?  01/30/22 ?2110  ?HGBA1C 8.7*  ? ?CBG: ?Recent Labs  ?Lab 02/01/22 ?0242 02/01/22 ?0759 02/01/22 ?1146 02/01/22 ?1624 02/01/22 ?2056  ?GLUCAP 222* 152* 243* 191* 217*  ? ?Lipid  Profile: ?No results for input(s): CHOL, HDL, LDLCALC, TRIG, CHOLHDL, LDLDIRECT in the last 72 hours. ?Thyroid Function Tests: ?No results for input(s): TSH, T4TOTAL, FREET4, T3FREE, THYROIDAB in the last 72 hours. ?Anemia Panel: ?No results for input(s): VITAMINB12, FOLATE, FERRITIN, TIBC, IRON, RETICCTPCT in the last 72 hours. ?Sepsis Labs: ?Recent Labs  ?Lab 01/30/22 ?0326  ?PROCALCITON <0.10  ? ? ?Recent Results (from the past 240 hour(s))  ?Resp Panel by RT-PCR (Flu A&B, Covid) Nasopharyngeal Swab     Status: None  ? Collection Time: 01/30/22  3:26 AM  ? Specimen: Nasopharyngeal Swab; Nasopharyngeal(NP) swabs in vial transport medium  ?Result Value Ref Range Status  ? SARS Coronavirus 2 by RT PCR NEGATIVE NEGATIVE Final  ?  Comment: (NOTE) ?SARS-CoV-2 target nucleic acids are NOT DETECTED. ? ?The SARS-CoV-2 RNA is generally detectable in upper respiratory ?specimens during the acute phase of infection. The lowest ?concentration of SARS-CoV-2 viral copies this assay can detect is ?138 copies/mL. A negative result does not preclude SARS-Cov-2 ?infection and should not be used as the sole basis for treatment or ?other patient management decisions. A negative result may occur with  ?improper specimen collection/handling, submission of specimen other ?than nasopharyngeal swab, presence of viral mutation(s) within the ?areas targeted by this assay, and inadequate number of viral ?copies(<138 copies/mL). A negative result must be combined with ?clinical observations, patient history, and epidemiological ?information. The expected result is Negative. ? ?Fact Sheet for Patients:  ?EntrepreneurPulse.com.au ? ?Fact Sheet for Healthcare Providers:  ?IncredibleEmployment.be ? ?This test is no t yet approved or cleared by the [place] FDA and  ?has been authorized for detection and/or diagnosis of SARS-CoV-2 by ?FDA under an Emergency Use Authorization (EUA). This EUA will remain  ?in  effect (meaning this test can be used) for the duration of the ?COVID-19 declaration under Section 564(b)(1) of the Act, 21 ?U.S.C.section 360bbb-3(b)(1), unless the authorization is terminated  ?or revoked sooner.  ? ? ?  ? Influenza A by PCR NEGATIVE NEGATIVE Final  ? Influenza B by PCR NEGATIVE NEGATIVE Final  ?  Comment: (NOTE) ?The Xpert Xpress SARS-CoV-2/FLU/RSV plus assay is intended as an aid ?in the diagnosis of influenza from Nasopharyngeal swab specimens and ?should not be used as a sole basis for treatment. Nasal washings and ?aspirates are unacceptable for Xpert Xpress SARS-CoV-2/FLU/RSV ?testing. ? ?Fact Sheet for Patients: ?EntrepreneurPulse.com.au ? ?Fact Sheet for Healthcare Providers: ?IncredibleEmployment.be ? ?This test is not yet approved or cleared by the [place] FDA and ?has been authorized for detection and/or diagnosis of SARS-CoV-2 by ?FDA under an Emergency Use Authorization (EUA). This EUA will remain ?in effect (meaning this test can be used) for the duration of the ?COVID-19 declaration under Section 564(b)(1) of the Act, 21 U.S.C. ?section 360bbb-3(b)(1), unless the authorization is terminated or ?revoked. ? ?  Performed at [facility], [place], ?[place] [address] ?  ?Blood culture (routine x 2)     Status: None (Preliminary result)  ? Collection Time: 01/30/22  3:26 AM  ? Specimen: BLOOD  ?Result Value Ref Range Status  ? Specimen Description BLOOD LEFT AC  Final  ? Special Requests   Final  ?  BOTTLES DRAWN AEROBIC AND ANAEROBIC Blood Culture results may not be optimal due to an excessive volume of blood received in culture bottles  ? Culture   Final  ?  NO GROWTH 3 DAYS ?Performed at [facility], [address] [place], [place] [address] ?  ? Report Status PENDING  Incomplete  ?Blood culture (routine x 2)     Status: None (Preliminary result)  ? Collection Time: 01/30/22  8:55 AM  ? Specimen: BLOOD   ?Result Value Ref Range Status  ? Specimen Description BLOOD RIGHT ANTECUBITAL  Final  ? Special Requests   Final  ?  BOTTLES DRAWN AEROBIC AND ANAEROBIC Blood Culture adequate volume  ? Culture   Final  ?

## 2022-02-02 NOTE — Progress Notes (Signed)
Inpatient Diabetes Program Recommendations ? ?AACE/ADA: New Consensus Statement on Inpatient Glycemic Control (2015) ? ?Target Ranges:  Prepandial:   less than 140 mg/dL ?     Peak postprandial:   less than 180 mg/dL (1-2 hours) ?     Critically ill patients:  140 - 180 mg/dL  ? ? Latest Reference Range & Units 02/01/22 07:59 02/01/22 11:46 02/01/22 16:24 02/01/22 20:56  ?Glucose-Capillary 70 - 99 mg/dL 024 (H) ? ?3 units Novolog ? 243 (H) ? ?5 units Novolog ? 191 (H) ? ?3 units Novolog ? 217 (H) ? ? ? ?35 units Levemir  ? ? Latest Reference Range & Units 02/02/22 08:42  ?Glucose-Capillary 70 - 99 mg/dL 097 (H) ? ?3 units Novolog ?  ?(H): Data is abnormally high ? ? ? ?Home DM Meds: Jardiance 25 mg daily ?       Levemir 35 units QHS ?       Metformin 1000 mg BID ?       Ozempic 0.5 mg Qweek ? ?Current Orders: Levemir 35 units QHS  ?Novolog 0-15 units TID ? ? ? ?MD- Please consider: ? ?1. Increase Levemir slightly to 37 units QHS ? ?2. Start Novolog Meal Coverage: Novolog 3 units TID with meals ?HOLD if pt eats <50% meals ? ? ? ?--Will follow patient during hospitalization-- ? ?Ambrose Finland RN, MSN, CDE ?Diabetes Coordinator ?Inpatient Glycemic Control Team ?Team Pager: [phone] (8a-5p) ? ?

## 2022-02-02 NOTE — Care Management Important Message (Signed)
Important Message ? ?Patient Details  ?Name: Emily Hinton ?MRN: [id number] ?Date of Birth: 1961/06/05 ? ? ?Medicare Important Message Given:  Yes ? ? ? ? ?[name] ?02/02/2022, 2:28 PM ?

## 2022-02-02 NOTE — Progress Notes (Signed)
Mobility Specialist - Progress Note ? ? ? 02/02/22 1500  ?Mobility  ?Activity Ambulated independently in hallway;Stood at bedside;Dangled on edge of bed  ?Level of Assistance Independent  ?Assistive Device None  ?Activity Response Tolerated well  ?$Mobility charge 1 Mobility  ? ? ?Pre-mobility: 95% SpO2 ?During mobility: 93 HR, 94-98% pO2 ?Post-mobility:79 HR, 98% SPO2 ? ?Pt supine upon arrival using RA. Pt completes EOB with ModI and STS indep. Pt ambulates with SUPERVISION and voices mild dizziness --- 2 pt initiated breaks against wall and states "it's just hard to breathe."  SOB with exertion however O2 maintained in high 90s throughout short rest break to manage breathing. Returned to bed with needs in reach.  ? ?Emily Hinton ?Mobility Specialist ?02/02/22, 3:47 PM ? ? ?

## 2022-02-03 DIAGNOSIS — J189 Pneumonia, unspecified organism: Secondary | ICD-10-CM | POA: Diagnosis not present

## 2022-02-03 DIAGNOSIS — G4733 Obstructive sleep apnea (adult) (pediatric): Secondary | ICD-10-CM | POA: Diagnosis not present

## 2022-02-03 DIAGNOSIS — Z794 Long term (current) use of insulin: Secondary | ICD-10-CM | POA: Diagnosis not present

## 2022-02-03 DIAGNOSIS — E1169 Type 2 diabetes mellitus with other specified complication: Secondary | ICD-10-CM | POA: Diagnosis not present

## 2022-02-03 LAB — CBC
HCT: 43 % (ref 36.0–46.0)
Hemoglobin: 13.8 g/dL (ref 12.0–15.0)
MCH: 28.6 pg (ref 26.0–34.0)
MCHC: 32.1 g/dL (ref 30.0–36.0)
MCV: 89 fL (ref 80.0–100.0)
Platelets: 330 10*3/uL (ref 150–400)
RBC: 4.83 MIL/uL (ref 3.87–5.11)
RDW: 13.9 % (ref 11.5–15.5)
WBC: 5.9 10*3/uL (ref 4.0–10.5)
nRBC: 0 % (ref 0.0–0.2)

## 2022-02-03 LAB — BASIC METABOLIC PANEL
Anion gap: 7 (ref 5–15)
BUN: 22 mg/dL — ABNORMAL HIGH (ref 6–20)
CO2: 25 mmol/L (ref 22–32)
Calcium: 9.1 mg/dL (ref 8.9–10.3)
Chloride: 105 mmol/L (ref 98–111)
Creatinine, Ser: 1.07 mg/dL — ABNORMAL HIGH (ref 0.44–1.00)
GFR, Estimated: 60 mL/min — ABNORMAL LOW (ref >60)
Glucose, Bld: 269 mg/dL — ABNORMAL HIGH (ref 70–99)
Potassium: 4.4 mmol/L (ref 3.5–5.1)
Sodium: 137 mmol/L (ref 135–145)

## 2022-02-03 LAB — GLUCOSE, CAPILLARY: Glucose-Capillary: 179 mg/dL — ABNORMAL HIGH (ref 70–99)

## 2022-02-03 MED ORDER — LOSARTAN POTASSIUM 25 MG PO TABS: 25.0000 mg | ORAL_TABLET | Freq: Every day | ORAL | 0 refills | Status: DC

## 2022-02-03 MED ORDER — AZITHROMYCIN 250 MG PO TABS: 250.0000 mg | ORAL_TABLET | Freq: Every day | ORAL | 0 refills | Status: AC

## 2022-02-03 NOTE — Discharge Summary (Signed)
Physician Discharge Summary  ?Emily Hinton [id number] DOB: 07/30/1961 DOA: 01/30/2022 ? ?PCP: Patient, No Pcp Per (Inactive) ? ?Admit date: 01/30/2022 ?Discharge date: 02/03/2022 ? ?Admitted From: home  ?Disposition:  home  ? ?Recommendations for Outpatient Follow-up:  ?Follow up with PCP in 1-2 weeks ?F/u w/ cardio, Dr. Rockey Situ, in 1-2 weeks  ? ?Home Health: no  ?Equipment/Devices: ? ?Discharge Condition: stable  ?CODE STATUS: full  ?Diet recommendation: Heart Healthy / Carb Modified ? ?Brief/Interim Summary: ?HPI was taken from Dr. Francine Graven: ?Emily Hinton is a 61 y.o. female with medical history significant for nonischemic cardiomyopathy (last known LVEF of 55% from 07/22), chronic diastolic dysfunction CHF, diabetes mellitus who presents to the ER for evaluation of worsening shortness of breath and a cough productive of sputum streaked with blood. ?At baseline patient is usually short of breath with moderate exertion but over the last several days she has had worsening shortness of breath to the point that she is short of breath at rest.  She denies having any fever or chills, no leg swelling, no headache, no dizziness, no lightheadedness, no chest pain, no nausea, no vomiting, no blurred vision or diaphoresis. ?Upon arrival to the ER she was noted to be in respiratory distress and had room air pulse oximetry of 72%.  She was placed on 4 L of oxygen via nasal cannula and then transition to BiPAP to improve oxygenation and reduce work of breathing. ?Blood pressure was significantly elevated at 184/110 5 she had a low-grade temp with a Tmax of 100.1 ? ? ?Hospital course from Dr. Jimmye Norman 4/19-4/22/23: Pt was found to have CHF exacerbation & PNA on admission. Pt was treated w/ IV lasix, coreg, aldactone & losartan for CHF exacerbation. Lisinopril was d/c and pt was started on losartan as per cardio. Of note, pt was treated w/ IV ceftriaxone, azithromycin, bronchodilators, supplemental  oxygen & incentive spirometry for CAP. Pt was d/c on 2 days more of azithromycin to complete the course. Pt was able to be weaned off of supplemental oxygen prior to d/c.  ? ?Discharge Diagnoses:  ?Principal Problem: ?  Acute respiratory failure ([place]) ?Active Problems: ?  Acute on chronic diastolic CHF (congestive heart failure) ([place]) ?  Diabetes mellitus ([place]) ?  Essential hypertension ?  CAP (community acquired pneumonia) ?Acute hypoxic respiratory failure: likely secondary to PNA & CHF. 72% on RA. Resolved  ?  ?Acute on chronic diastolic CHF: continue on coreg, aldactone & losartan as per cardio. D/c lisinopril. Continue on IV lasix. Monitor I/Os. Cardio following recs apprec. Repeat echo shows EF 40-45%, LV global hypokinesis & grade II diastolic dysfunction  ? ?CAP: continue on azithromycin x 2 days more, bronchodilators & encourage incentive spirometry  ?  ?DM2: poorly controlled, HbA1c 8.7. Continue on glargine, SSI w/ accuchecks ? ?HTN: continue on coreg & losartan as per cardio. D/c lisinopril  ? ?Obesity: BMI 37.9. Would benefit from weight loss ? ?Hypokalemia: WNL  ? ?Hx of OSA: does not use CPAP at home. Will see a sleep specialist as an outpatient to get another sleep study. Pt verbalized her understanding  ? ? ?Discharge Instructions ? ?Discharge Instructions   ? ? Diet - low sodium heart healthy   Complete by: As directed ?  ? Diet Carb Modified   Complete by: As directed ?  ? Discharge instructions   Complete by: As directed ?  ? F/u w/ PCP in 1-2 weeks. F/u w/ cardio, Dr. Rockey Situ, in 1-2 weeks  ? Increase activity  slowly   Complete by: As directed ?  ? ?  ? ?Allergies as of 02/03/2022   ? ?   Reactions  ? Cashew Nut Oil Hives, Itching, Rash  ? ?  ? ?  ?Medication List  ?  ? ?STOP taking these medications   ? ?amLODipine 2.5 MG tablet ?Commonly known as: NORVASC ?  ?lisinopril 40 MG tablet ?Commonly known as: ZESTRIL ?  ? ?  ? ?TAKE these medications   ? ?aspirin 81 MG chewable tablet ?Chew 81 mg by  mouth daily. ?  ?azithromycin 250 MG tablet ?Commonly known as: Zithromax ?Take 1 tablet (250 mg total) by mouth daily for 2 days. ?  ?carvedilol 6.25 MG tablet ?Commonly known as: COREG ?Take 1 tablet by mouth 2 (two) times daily. ?  ?empagliflozin 25 MG Tabs tablet ?Commonly known as: JARDIANCE ?Take 25 mg by mouth daily. ?  ?gabapentin 600 MG tablet ?Commonly known as: NEURONTIN ?Take 600 mg by mouth 3 (three) times daily. ?  ?insulin detemir 100 UNIT/ML injection ?Commonly known as: LEVEMIR ?Inject 35 Units into the skin at bedtime. ?  ?losartan 25 MG tablet ?Commonly known as: COZAAR ?Take 1 tablet (25 mg total) by mouth daily. ?Start taking on: February 04, 2022 ?  ?metFORMIN 1000 MG tablet ?Commonly known as: GLUCOPHAGE ?Take 1,000 mg by mouth 2 (two) times daily with a meal. ?  ?omeprazole 20 MG capsule ?Commonly known as: PRILOSEC ?Take 20 mg by mouth daily. ?  ?OZEMPIC (0.25 OR 0.5 MG/DOSE) [place] ?Inject 0.5 mg into the skin once a week. ?  ?spironolactone 25 MG tablet ?Commonly known as: ALDACTONE ?Take 25 mg by mouth daily. ?  ?torsemide 20 MG tablet ?Commonly known as: DEMADEX ?Take 20 mg by mouth daily. ?  ? ?  ? ? ?Allergies  ?Allergen Reactions  ? Cashew Nut Oil Hives, Itching and Rash  ? ? ?Consultations: ?Cardio  ? ? ?Procedures/Studies: ?CT CHEST WO CONTRAST ? ?Result Date: 01/30/2022 ?CLINICAL DATA:  Increasing shortness of breath and hemoptysis. Hypoxemia. Pneumonia, complications suspected. EXAM: CT CHEST WITHOUT CONTRAST TECHNIQUE: Multidetector CT imaging of the chest was performed following the standard protocol without IV contrast. RADIATION DOSE REDUCTION: This exam was performed according to the departmental dose-optimization program which includes automated exposure control, adjustment of the mA and/or kV according to patient size and/or use of iterative reconstruction technique. COMPARISON:  Radiographs earlier today and 07/26/2020. CT 07/27/2020. FINDINGS: Cardiovascular: No significant  vascular findings on noncontrast imaging. The heart size is normal. There is no pericardial effusion. Mediastinum/Nodes: There are no enlarged mediastinal, hilar or axillary lymph nodes.Hilar assessment is limited by the lack of intravenous contrast, although the hilar contours appear unchanged. The thyroid gland, trachea and esophagus demonstrate no significant findings. Lungs/Pleura: Trace pleural fluid on the left. No significant right pleural effusion. No pneumothorax. Multifocal airspace opacities involving all lobes have progressed from the radiographs done earlier today. Consolidative components are greatest in the left upper lobe and both lower lobes. No evidence of lung mass or endobronchial lesion. Upper abdomen: Subjective hepatic steatosis. No acute abnormality identified within the visualized upper abdomen. Musculoskeletal/Chest wall: There is no chest wall mass or suspicious osseous finding. Multilevel spondylosis. Prominent posterior osteophytes at T1-2 are chronic, although may contribute to spinal stenosis. IMPRESSION: 1. Progressive diffuse pulmonary airspace opacities from radiographs done earlier today, consistent with worsening multilobar pneumonia. 2. Minimal pleural fluid on the left. No evidence of significant adenopathy. 3. Subjective hepatic steatosis. 4. Prominent posterior osteophytes at  T1-2 which may contribute to chronic spinal stenosis. Electronically Signed   By: Richardean Sale M.D.   On: 01/30/2022 10:54  ? ?DG Chest Portable 1 View ? ?Result Date: 01/30/2022 ?CLINICAL DATA:  Shortness of breath and coughing up blood. Oxygenation 72% on room air. EXAM: PORTABLE CHEST 1 VIEW COMPARISON:  PA and lateral 07/27/2019 FINDINGS: There is mild cardiomegaly and increased perihilar vascular congestion. There is development of generalized interstitial consolidation consistent with interstitial edema, and small pleural effusions. There is trace fluid in the horizontal fissure. There are  bilateral perihilar hazy opacities which are probably due to ground-glass edema, pneumonia possible but less likely given other findings. The peripheral lungs clear. There is mild chronic elevation of the right diaphra

## 2022-02-04 LAB — CULTURE, BLOOD (ROUTINE X 2)
Culture: NO GROWTH
Culture: NO GROWTH
Special Requests: ADEQUATE

## 2022-02-12 NOTE — Progress Notes (Signed)
? Patient ID: Emily Hinton, female    DOB: 03-31-1961, 61 y.o.   MRN: [id number] ? ?HPI ? ?Ms [name] is a 61 y/o female with a history of CAD, DM, CSF shunt, hyperlipidemia, HTN, obesity, obstructive sleep apnea and chronic heart failure.  ? ?Echo report from 01/30/22 reviewed and showed an EF of 40-45% along with mild MR.  ? ?Admitted 01/30/22 due to worsening shortness of breath and a cough productive of sputum streaked with blood. Cardiology consult obtained. Initially placed on bipap due to hypoxia. Initially given IV lasix with transition to oral diuretics. Antibiotics and bronchodilators given for CAP. Able to be weaned off of oxygen. Discharged after 4 days.  ? ?She presents today for her initial visit with a chief complaint of moderate fatigue with little exertion. Describes this as chronic in nature. She has associated shortness of breath, chronic light-headedness, chronic difficulty sleeping and back pain along with this. She denies any abdominal distention, palpitations, pedal edema, chest pain, cough or weight gain.  ? ?Is weighing herself daily and understands to call for an overnight weight gain of > 2 pounds or a weekly weight gain of > 5 pounds. Is trying to reduce her sodium consumption but admits that it's hard but that she's trying. Was drinking 128 ounces of water daily prior to her admission and is now drinking 64 ounces of water daily.  ? ?Past Medical History:  ?Diagnosis Date  ? CHF (congestive heart failure) (HCC)   ? CSF leak   ? Diabetes mellitus without complication (HCC)   ? DM2 (diabetes mellitus, type 2) (HCC)   ? Essential hypertension   ? Hyperlipidemia   ? Morbid obesity (HCC)   ? NICM (nonischemic cardiomyopathy) (HCC)   ? Nonobstructive atherosclerosis of coronary artery   ? OSA (obstructive sleep apnea)   ? ?Past Surgical History:  ?Procedure Laterality Date  ? CSF SHUNT  2017  ? herniated disc  2017  ? ?History reviewed. No pertinent family history. ?Social History   ? ?Tobacco Use  ? Smoking status: Not on file  ? Smokeless tobacco: Not on file  ?Substance Use Topics  ? Alcohol use: Not on file  ? ?Allergies  ?Allergen Reactions  ? Cashew Nut Oil Hives, Itching and Rash  ? ?Prior to Admission medications   ?Medication Sig Start Date End Date Taking? Authorizing Provider  ?acetaminophen (TYLENOL) 500 MG tablet Take 500 mg by mouth every 6 (six) hours as needed.   Yes [provider]  ?aspirin 81 MG chewable tablet Chew 81 mg by mouth daily. 09/15/21  Yes [provider]  ?carvedilol (COREG) 6.25 MG tablet Take 1 tablet by mouth 2 (two) times daily. 01/16/22  Yes [provider]  ?empagliflozin (JARDIANCE) 25 MG TABS tablet Take 25 mg by mouth daily. 09/15/21 03/14/22 Yes [provider]  ?gabapentin (NEURONTIN) 600 MG tablet Take 600 mg by mouth 3 (three) times daily.   Yes [provider]  ?insulin detemir (LEVEMIR) 100 UNIT/ML injection Inject 40 Units into the skin at bedtime.   Yes [provider]  ?losartan (COZAAR) 25 MG tablet Take 1 tablet (25 mg total) by mouth daily. 02/04/22 03/06/22 Yes Charise Killian, MD  ?metFORMIN (GLUCOPHAGE) 1000 MG tablet Take 1,000 mg by mouth 2 (two) times daily with a meal.   Yes [provider]  ?omeprazole (PRILOSEC) 20 MG capsule Take 20 mg by mouth daily.   Yes [provider]  ?Semaglutide (OZEMPIC, 0.25 OR 0.5 MG/DOSE,  [place]) Inject 0.5 mg into the skin once a week.   Yes [provider]  ?spironolactone (ALDACTONE) 25 MG tablet Take 25 mg by mouth daily.   Yes [provider]  ?torsemide (DEMADEX) 20 MG tablet Take 20 mg by mouth daily. 01/16/22  Yes [provider]  ? ?Review of Systems  ?Constitutional:  Positive for appetite change (decreased) and fatigue (easily).  ?HENT:  Negative for congestion, postnasal drip and sore throat.   ?Eyes: Negative.   ?Respiratory:  Positive for shortness of breath (with moderate exertion). Negative for  cough.   ?Cardiovascular:  Negative for chest pain, palpitations and leg swelling.  ?Gastrointestinal:  Negative for abdominal distention and abdominal pain.  ?Endocrine: Negative.   ?Genitourinary: Negative.   ?Musculoskeletal:  Positive for back pain (due to being in bed during admission).  ?Skin: Negative.   ?Allergic/Immunologic: Negative.   ?Neurological:  Positive for light-headedness (chronic). Negative for dizziness.  ?Hematological:  Negative for adenopathy. Does not bruise/bleed easily.  ?Psychiatric/Behavioral:  Positive for sleep disturbance (chronic sleeping; sleeping on 3 pillows). Negative for dysphoric mood. The patient is not nervous/anxious.   ? ?Vitals:  ? 02/13/22 1313  ?BP: 103/73  ?Pulse: 86  ?Resp: 14  ?SpO2: 100%  ?Weight: 269 lb 5 oz (122.2 kg)  ?Height: 5\' 11"  (1.803 m)  ? ?Wt Readings from Last 3 Encounters:  ?02/13/22 269 lb 5 oz (122.2 kg)  ?02/03/22 275 lb 9.2 oz (125 kg)  ?07/26/20 282 lb (127.9 kg)  ? ?Lab Results  ?Component Value Date  ? CREATININE 1.07 (H) 02/03/2022  ? CREATININE 1.07 (H) 02/02/2022  ? CREATININE 0.93 02/01/2022  ? ?Physical Exam ?Vitals and nursing note reviewed.  ?Constitutional:   ?   Appearance: Normal appearance.  ?HENT:  ?   Head: Normocephalic and atraumatic.  ?Cardiovascular:  ?   Rate and Rhythm: Normal rate and regular rhythm.  ?Pulmonary:  ?   Effort: Pulmonary effort is normal. No respiratory distress.  ?   Breath sounds: No wheezing or rales.  ?Abdominal:  ?   General: There is no distension.  ?   Palpations: Abdomen is soft.  ?   Tenderness: There is no abdominal tenderness.  ?Musculoskeletal:     ?   General: No tenderness.  ?   Cervical back: Normal range of motion and neck supple.  ?   Right lower leg: No edema.  ?   Left lower leg: No edema.  ?Skin: ?   General: Skin is warm and dry.  ?Neurological:  ?   General: No focal deficit present.  ?   Mental Status: She is alert and oriented to person, place, and time.  ?Psychiatric:     ?   Mood and  Affect: Mood normal.     ?   Behavior: Behavior normal.     ?   Thought Content: Thought content normal.  ? ?Assessment & Plan: ? ?1: Chronic heart failure with reduced ejection fraction- ?- NYHA class III ?- euvolemic today ?- weighing daily and understands to call for an overnight weight gain of > 2 pounds or a weekly weight gain of > 5 pounds ?- is trying to decrease her sodium usage and says that it's hard but that she's trying; reviewed the importance of not adding any salt to her food and to read food labels for sodium content so that she can keep daily sodium intake to ~ 2000mg  ?- saw cardiology ([facility]rebenko[facility]Hol[facility]orod[facility]) in 02/03/2022 on 09/15/21; will see [facility]  cardiology 02/22/21 ?- on GDMT of carvedilol, jardiance, losartan and spironolactone ?- doubt current BP could tolerate changing her losartan to entresto ?- has decreased her fluid intake in half and is now drinking 64 ounces of water daily ?- BMP 01/30/22 was 189.7 ? ?2: HTN-  ?- BP looks good (103/73) ?- saw PCP in [place] on 02/12/22 ?- instructed to get her medicare changed from [place] to [place] if she plans on getting local PCP ?- BMP 02/03/22 reviewed and showed sodium 137, potassium 4.4, creatinine 1.07 & GFR 60 ? ?3: DM- ?- A1c 01/30/22 was 8.7% ?- fasting glucose at home this morning was 110 ? ?4: Sleep apnea- ?- was using CPAP some but not enough so the company came and picked up the machine ?- will probably need a new sleep study but most likely will have to wait until her medicare gets changed over to [place] ? ? ?Medication list reviewed.  ? ?Return in 6 weeks, sooner if needed.  ? ?

## 2022-02-13 ENCOUNTER — Ambulatory Visit: Payer: Medicare (Managed Care) | Attending: Family | Admitting: Family

## 2022-02-13 ENCOUNTER — Encounter: Payer: Self-pay | Admitting: Family

## 2022-02-13 VITALS — BP 103/73 | HR 86 | Resp 14 | Ht 71.0 in | Wt 269.3 lb

## 2022-02-13 DIAGNOSIS — I110 Hypertensive heart disease with heart failure: Secondary | ICD-10-CM | POA: Insufficient documentation

## 2022-02-13 DIAGNOSIS — E785 Hyperlipidemia, unspecified: Secondary | ICD-10-CM | POA: Diagnosis not present

## 2022-02-13 DIAGNOSIS — E1122 Type 2 diabetes mellitus with diabetic chronic kidney disease: Secondary | ICD-10-CM | POA: Diagnosis not present

## 2022-02-13 DIAGNOSIS — I10 Essential (primary) hypertension: Secondary | ICD-10-CM

## 2022-02-13 DIAGNOSIS — N182 Chronic kidney disease, stage 2 (mild): Secondary | ICD-10-CM

## 2022-02-13 DIAGNOSIS — M549 Dorsalgia, unspecified: Secondary | ICD-10-CM | POA: Insufficient documentation

## 2022-02-13 DIAGNOSIS — Z7984 Long term (current) use of oral hypoglycemic drugs: Secondary | ICD-10-CM | POA: Insufficient documentation

## 2022-02-13 DIAGNOSIS — Z794 Long term (current) use of insulin: Secondary | ICD-10-CM

## 2022-02-13 DIAGNOSIS — E119 Type 2 diabetes mellitus without complications: Secondary | ICD-10-CM | POA: Insufficient documentation

## 2022-02-13 DIAGNOSIS — R0602 Shortness of breath: Secondary | ICD-10-CM | POA: Diagnosis not present

## 2022-02-13 DIAGNOSIS — R42 Dizziness and giddiness: Secondary | ICD-10-CM | POA: Insufficient documentation

## 2022-02-13 DIAGNOSIS — G4733 Obstructive sleep apnea (adult) (pediatric): Secondary | ICD-10-CM | POA: Diagnosis not present

## 2022-02-13 DIAGNOSIS — Z79899 Other long term (current) drug therapy: Secondary | ICD-10-CM | POA: Insufficient documentation

## 2022-02-13 DIAGNOSIS — I5022 Chronic systolic (congestive) heart failure: Secondary | ICD-10-CM | POA: Diagnosis not present

## 2022-02-13 DIAGNOSIS — Z7901 Long term (current) use of anticoagulants: Secondary | ICD-10-CM | POA: Diagnosis not present

## 2022-02-13 DIAGNOSIS — I2510 Atherosclerotic heart disease of native coronary artery without angina pectoris: Secondary | ICD-10-CM | POA: Insufficient documentation

## 2022-02-13 DIAGNOSIS — R5383 Other fatigue: Secondary | ICD-10-CM | POA: Diagnosis present

## 2022-02-13 NOTE — Patient Instructions (Addendum)
Continue weighing daily and call for an overnight weight gain of 3 pounds or more or a weekly weight gain of more than 5 pounds. ? ? ?If you have voicemail, please make sure your mailbox is cleaned out so that we may leave a message and please make sure to listen to any voicemails.  ? ? ?Go to social service department and ask how to switch your medicare from [place] to [place] ?

## 2022-02-22 ENCOUNTER — Ambulatory Visit (INDEPENDENT_AMBULATORY_CARE_PROVIDER_SITE_OTHER): Payer: Medicare (Managed Care) | Admitting: Physician Assistant

## 2022-02-22 ENCOUNTER — Encounter: Payer: Self-pay | Admitting: Physician Assistant

## 2022-02-22 VITALS — BP 120/82 | HR 86 | Ht 71.0 in | Wt 273.1 lb

## 2022-02-22 DIAGNOSIS — R072 Precordial pain: Secondary | ICD-10-CM | POA: Diagnosis not present

## 2022-02-22 DIAGNOSIS — I10 Essential (primary) hypertension: Secondary | ICD-10-CM

## 2022-02-22 DIAGNOSIS — I25118 Atherosclerotic heart disease of native coronary artery with other forms of angina pectoris: Secondary | ICD-10-CM | POA: Diagnosis not present

## 2022-02-22 DIAGNOSIS — I5022 Chronic systolic (congestive) heart failure: Secondary | ICD-10-CM | POA: Diagnosis not present

## 2022-02-22 DIAGNOSIS — I428 Other cardiomyopathies: Secondary | ICD-10-CM | POA: Diagnosis not present

## 2022-02-22 MED ORDER — SACUBITRIL-VALSARTAN 24-26 MG PO TABS: 1.0000 | ORAL_TABLET | Freq: Two times a day (BID) | ORAL | 3 refills | Status: DC

## 2022-02-22 MED ORDER — METOPROLOL TARTRATE 100 MG PO TABS: 100.0000 mg | ORAL_TABLET | Freq: Once | ORAL | 0 refills | Status: DC

## 2022-02-22 MED ORDER — SACUBITRIL-VALSARTAN 24-26 MG PO TABS: 1.0000 | ORAL_TABLET | Freq: Two times a day (BID) | ORAL | 11 refills | Status: DC

## 2022-02-22 NOTE — Progress Notes (Signed)
? ?Cardiology Office Note   ? ?Date:  02/22/2022  ? ?ID:  Emily Hinton, DOB November 09, 1960, MRN [id number] ? ?PCP:  Patient, No Pcp Per (Inactive)  ?Cardiologist:  Ida Rogue, MD  ?Electrophysiologist:  None  ? ?Chief Complaint: Hospital follow-up ? ?History of Present Illness:  ? ?Emily Hinton is a 61 y.o. 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 ? ?Emily Hinton was previously followed by cardiology in [place]. She was found to have a cardiomyopathy in 2018 with an EF of 45%. LHC at that time showed nonobstructive coronary arteries.  Echo in 05/2018 demonstrated a persistent cardiomyopathy with an EF of 45%, global hypokinesis, and diastolic dysfunction. This finding was unchanged when compared to echo in 2018.  Subsequent echo in 04/2021 showed an improved LV systolic function with an EF of 55%, probable normal LV wall motion, and diastolic dysfunction. She moved to [facility] from [place] about 2.5 years ago and has not yet established with cardiology.  ? ?She was admitted to the hospital from 4/18 through 02/03/2022 with worsening dyspnea and productive cough and found to have multilobar pneumonia with mild volume overload.  She was hypertensive and tachycardic upon presentation with an oxygen saturation of 72% on room air.  High-sensitivity troponin 35 with a delta troponin of 20.  BNP 189.  Chest x-ray showed volume overload versus pneumonia with small pleural effusions.  CT of the chest showed multilobar pneumonia with minimal pleural fluid on the left.  Echo during this admission showed an EF of 40-45%, mildly to moderately dilated LV internal cavity size, global  hypokinesis, Gr2DD, normal RV systolic function and ventricular cavity size, and mild mitral regurgitation.  She was gently diuresed and treated with antibiotics per internal medicine with symptomatic improvement. ? ?She comes in doing well from a cardiac perspective and without symptoms of angina or decompensation.  Following her hospital admission, she did note some low back pain and wonders if this was related to the bed.  This is improving.  No dyspnea, dizziness, presyncope, syncope, lower extremity swelling, or abdominal distention.  She has a stable chronic 2-3 pillow orthopnea which is related to her underlying prior CSF leak.  She does continue to note occasional twinges of chest discomfort that are short-lived and occur primarily when laying down at night.  She is tolerating cardiac medications without issues.  She has significantly reduced her water intake and is now drinking less than 2 L of water per day.  She is also no longer adding salt to her food.  Her weight has remained stable at home. ? ? ?Labs independently reviewed: ?01/2022 - potassium 4.4, BUN 22, serum creatinine 1.07, Hgb 13.8, PLT 330, A1c 8.7 ?09/2021 - TSH normal, magnesium 2.0 ?03/2021 - albumin 4.7, AST/ALT normal ?11/2020 - TC 164, TG 190, HDL 54, LDL 72 ? ?Past Medical History:  ?Diagnosis Date  ? CHF (congestive heart failure) ([place])   ? CSF leak   ? Diabetes mellitus without complication ([place])   ? DM2 (diabetes mellitus, type 2) ([place])   ? Essential hypertension   ? Hyperlipidemia   ? Morbid obesity ([place])   ? NICM (nonischemic cardiomyopathy) ([place])   ?  Nonobstructive atherosclerosis of coronary artery   ? OSA (obstructive sleep apnea)   ? ? ?Past Surgical History:  ?Procedure Laterality Date  ? CSF SHUNT  2017  ? herniated disc  2017  ? ? ?Current Medications: ?Current Meds  ?Medication Sig  ? acetaminophen (TYLENOL) 500 MG tablet Take 500 mg by mouth every 6 (six) hours as needed.  ? aspirin 81 MG chewable tablet Chew 81 mg by mouth  daily.  ? carvedilol (COREG) 6.25 MG tablet Take 1 tablet by mouth 2 (two) times daily.  ? empagliflozin (JARDIANCE) 25 MG TABS tablet Take 25 mg by mouth daily.  ? gabapentin (NEURONTIN) 600 MG tablet Take 600 mg by mouth 3 (three) times daily.  ? insulin detemir (LEVEMIR) 100 UNIT/ML injection Inject 40 Units into the skin at bedtime.  ? metFORMIN (GLUCOPHAGE) 1000 MG tablet Take 1,000 mg by mouth 2 (two) times daily with a meal.  ? metoprolol tartrate (LOPRESSOR) 100 MG tablet Take 1 tablet (100 mg total) by mouth once for 1 dose. Take 2 hours prior to test.  ? omeprazole (PRILOSEC) 20 MG capsule Take 20 mg by mouth daily.  ? sacubitril-valsartan (ENTRESTO) 24-26 MG Take 1 tablet by mouth 2 (two) times daily.  ? sacubitril-valsartan (ENTRESTO) 24-26 MG Take 1 tablet by mouth 2 (two) times daily.  ? Semaglutide (OZEMPIC, 0.25 OR 0.5 MG/DOSE, [place]) Inject 0.5 mg into the skin once a week.  ? spironolactone (ALDACTONE) 25 MG tablet Take 25 mg by mouth daily.  ? torsemide (DEMADEX) 20 MG tablet Take 20 mg by mouth daily.  ? [DISCONTINUED] losartan (COZAAR) 25 MG tablet Take 1 tablet (25 mg total) by mouth daily.  ? ? ?Allergies:   Cashew nut oil  ? ?Social History  ? ?Socioeconomic History  ? Marital status: Divorced  ?  Spouse name: Not on file  ? Number of children: Not on file  ? Years of education: Not on file  ? Highest education level: Not on file  ?Occupational History  ? Not on file  ?Tobacco Use  ? Smoking status: Never  ? Smokeless tobacco: Never  ?Vaping Use  ? Vaping Use: Never used  ?Substance and Sexual Activity  ? Alcohol use: Never  ? Drug use: Never  ? Sexual activity: Not on file  ?Other Topics Concern  ? Not on file  ?Social History Narrative  ? Not on file  ? ?Social Determinants of Health  ? ?Financial Resource Strain: Not on file  ?Food Insecurity: Not on file  ?Transportation Needs: Not on file  ?Physical Activity: Not on file  ?Stress: Not on file  ?Social Connections: Not on file  ?  ? ?Family  History:  ?The patient's family history includes Heart failure in her father; Hypertension in her father; Kidney disease in her father. ? ?ROS:   ?12-point review of systems is negative unless otherwise noted in HPI. ? ? ?EKGs/Labs/Other Studies Reviewed:   ? ?Studies reviewed were summarized above. The additional studies were reviewed today: ? ?2D echo 01/30/2022: ?1. Left ventricular ejection fraction, by estimation, is 40 to 45%. The  ?left ventricle has mildly decreased function. The left ventricle  ?demonstrates global hypokinesis. The left ventricular internal cavity size  ?was mildly to moderately dilated. Left  ?ventricular diastolic parameters are consistent with Grade II diastolic  ?dysfunction (pseudonormalization). Elevated left atrial pressure.  ? 2. Right ventricular systolic function is normal. The right ventricular  ?size is normal. Tricuspid regurgitation signal is inadequate for assessing  ?  PA pressure.  ? 3. The mitral valve is normal in structure. Mild mitral valve  ?regurgitation. No evidence of mitral stenosis.  ? 4. The aortic valve was not well visualized. Aortic valve regurgitation  ?is not visualized. No aortic stenosis is present. ?__________ ?  ?2D echo 05/12/2021 ([place]): ?Overall Conclusions:  ?- Technically difficult study.    ?Low normal LV systolic function.  ? ?Findings  ? ?Left Ventricle:  ?Left ventricular size is normal. Mildly increased LV wall thickness. The left ventricular systolic function appears normal.  ?EF evaluated by biplane method of disks. Borderline left ventricular ejection fraction.  (EF 55 %). Probably normal left  ?ventricular wall motion. . Impaired relaxation pattern with normal left atrial pressure. Pulmonary vein flow pattern is normal. ?__________ ?  ?2D echo 05/26/2018 ([place]): ?Conclusions  ? ?Left Ventricle:  ?- Mild diffuse left ventricular hypokinesis .    ? ?Previous Study Comparison  ?Overview:                    There is no significant change  compared to the prior study.  ?Previous Study Date:         06/18/2017  ? ?Findings  ? ?Left Ventricle:  ?Left ventricle upper limits of normal. Mild diffuse left ventricular hypokinesis . Mildly decreas

## 2022-02-22 NOTE — Patient Instructions (Signed)
Medication Instructions:  ?Your physician has recommended you make the following change in your medication:  ? ?STOP Losartan ?START Entresto 24-26 mg twice daily  ? ?*If you need a refill on your cardiac medications before your next appointment, please call your pharmacy* ? ? ?Lab Work: ?BMET in one week. Go to [facility] entrance and check in at registration. No appointment is needed for this.  ? ? ?If you have labs (blood work) drawn today and your tests are completely normal, you will receive your results only by: ?MyChart Message (if you have MyChart) OR ?A paper copy in the mail ?If you have any lab test that is abnormal or we need to change your treatment, we will call you to review the results. ? ? ?Testing/Procedures: ? ? ?Your cardiac CT is scheduled at the below location:  ? ?[facility] ?[address] ?[address] ?[place], [place] [address] ?[phone] ? ?Scheduled for May 18th at 08:00 am. Please arrive at 07:45 am for check-in and test prep.  ? ? ?Please follow these instructions carefully (unless otherwise directed): ? ?Hold all erectile dysfunction medications at least 3 days (72 hrs) prior to test. ? ?On the Night Before the Test: ?Be sure to Drink plenty of water. ?Do not consume any caffeinated/decaffeinated beverages or chocolate 12 hours prior to your test. ?Do not take any antihistamines 12 hours prior to your test. ? ? ?On the Day of the Test: ?Drink plenty of water until 1 hour prior to the test. ?Do not eat any food 4 hours prior to the test. ?You may take your regular medications prior to the test.  ?Take metoprolol (Lopressor) 100 mg two hours prior to test. ?HOLD Torsemide morning of the test. ?FEMALES- please wear underwire-free bra if available, avoid dresses & tight clothing ? ? ?     ?After the Test: ?Drink plenty of water. ?After receiving IV contrast, you may experience a mild flushed feeling. This is normal. ?On occasion, you may  experience a mild rash up to 24 hours after the test. This is not dangerous. If this occurs, you can take Benadryl 25 mg and increase your fluid intake. ?If you experience trouble breathing, this can be serious. If it is severe call 911 IMMEDIATELY. If it is mild, please call our office. ?If you take any of these medications: Glipizide/Metformin, Avandament, Glucavance, please do not take 48 hours after completing test unless otherwise instructed. ? ? ?For non-scheduling related questions, please contact the cardiac imaging nurse navigator should you have any questions/concerns: ?Rockwell Alexandria, Cardiac Imaging Nurse Navigator ?Larey Brick, Cardiac Imaging Nurse Navigator ?[facility] ?Direct Office Dial: [phone]  ? ?For scheduling needs, including cancellations and rescheduling, please call [place], [phone]. ? ? ? ?Follow-Up: ?At [facility], you and your health needs are our priority.  As part of our continuing mission to provide you with exceptional heart care, we have created designated Provider Care Teams.  These Care Teams include your primary Cardiologist (physician) and Advanced Practice Providers (APPs -  Physician Assistants and Nurse Practitioners) who all work together to provide you with the care you need, when you need it. ? ?We recommend signing up for the patient portal called "MyChart".  Sign up information is provided on this After Visit Summary.  MyChart is used to connect with patients for Virtual Visits (Telemedicine).  Patients are able to view lab/test results, encounter notes, upcoming appointments, etc.  Non-urgent messages can be sent  to your provider as well.   ?To learn more about what you can do with MyChart, go to [email or web address].   ? ?Your next appointment:   ?1 month(s) ? ?The format for your next appointment:   ?In Person ? ?Provider:   ?Julien Nordmann, MD or Eula Listen, PA-C  ? ? ? ? ?Important Information About Sugar ? ? ? ? ?  ?

## 2022-02-23 ENCOUNTER — Other Ambulatory Visit: Payer: Self-pay | Admitting: Physician Assistant

## 2022-02-28 ENCOUNTER — Telehealth (HOSPITAL_COMMUNITY): Payer: Self-pay | Admitting: *Deleted

## 2022-02-28 NOTE — Telephone Encounter (Signed)
Attempted to call patient regarding upcoming cardiac CT appointment. °Left message on voicemail with name and callback number ° °Kenedi Cilia RN Navigator Cardiac Imaging °[facility] °[phone] Office °[phone] Cell ° °

## 2022-02-28 NOTE — Telephone Encounter (Signed)
Patient returning call regarding upcoming cardiac imaging study; pt verbalizes understanding of appt date/time, parking situation and where to check in, pre-test NPO status and medications ordered, name and call back number provided for further questions should they arise ? ?Gordy Clement RN Navigator Cardiac Imaging ?[facility] ?[phone] office ?[phone] cell ? ?She is to take 100mg  metoprolol tartrate two hours prior to her cardiac CT scan. ?

## 2022-03-01 ENCOUNTER — Ambulatory Visit
Admission: RE | Admit: 2022-03-01 | Discharge: 2022-03-01 | Disposition: A | Discharge status: Home / Self Care | Payer: Medicare (Managed Care) | Source: Ambulatory Visit | Attending: Physician Assistant | Admitting: Physician Assistant

## 2022-03-01 DIAGNOSIS — R072 Precordial pain: Secondary | ICD-10-CM | POA: Insufficient documentation

## 2022-03-01 MED ORDER — IOHEXOL 350 MG/ML SOLN
100.0000 mL | Freq: Once | INTRAVENOUS | Status: AC | PRN
  Administered 2022-03-01: 100 mL via INTRAVENOUS

## 2022-03-01 MED ORDER — METOPROLOL TARTRATE 5 MG/5ML IV SOLN
10.0000 mg | Freq: Once | INTRAVENOUS | Status: AC
Start: 2022-03-01 — End: 2022-03-01
  Administered 2022-03-01: 10 mg via INTRAVENOUS

## 2022-03-01 MED ORDER — METOPROLOL TARTRATE 5 MG/5ML IV SOLN
10.0000 mg | Freq: Once | INTRAVENOUS | Status: AC
  Administered 2022-03-01: 10 mg via INTRAVENOUS

## 2022-03-01 MED ORDER — NITROGLYCERIN 0.4 MG SL SUBL
0.8000 mg | SUBLINGUAL_TABLET | Freq: Once | SUBLINGUAL | Status: AC
  Administered 2022-03-01: 0.8 mg via SUBLINGUAL

## 2022-03-01 NOTE — Progress Notes (Signed)
Patient tolerated procedure well. Ambulate w/o difficulty. Denies light headedness or being dizzy. Sitting in chair drinking water provided. Encouraged to drink extra water today and reasoning explained. Verbalized understanding. All questions answered. ABC intact. No further needs. Discharge from procedure area w/o issues.   °

## 2022-03-05 ENCOUNTER — Telehealth: Payer: Self-pay | Admitting: *Deleted

## 2022-03-05 NOTE — Telephone Encounter (Signed)
-----   Message from Sondra Barges, PA-C sent at 03/02/2022  7:55 AM EDT ----- Noncardiac over read: -Near complete resolution of previously noted groundglass opacities when compared to prior study -Other changes were also improvin when compared to prior imaging, though there were nonspecific changes possibly suggestive of inflammatory scarring. -Recommend patient follow-up with PCP for follow-up of these changes  Cardiac overread: -Calcium score 0 -No evidence of CAD -No further blockage testing indicated

## 2022-03-05 NOTE — Telephone Encounter (Signed)
Left voicemail message to call back for review of results.  

## 2022-03-06 NOTE — Telephone Encounter (Signed)
Reviewed results and recommendations with patient. She verbalized understanding with no further questions at this time.  ?

## 2022-03-27 ENCOUNTER — Ambulatory Visit: Payer: Medicare (Managed Care) | Admitting: Family

## 2022-03-28 ENCOUNTER — Ambulatory Visit: Payer: Medicare (Managed Care) | Admitting: Family

## 2022-04-12 ENCOUNTER — Encounter: Payer: Self-pay | Admitting: Physician Assistant

## 2022-04-12 ENCOUNTER — Ambulatory Visit (INDEPENDENT_AMBULATORY_CARE_PROVIDER_SITE_OTHER): Payer: Medicare (Managed Care) | Admitting: Physician Assistant

## 2022-04-12 VITALS — BP 120/80 | HR 69 | Ht 71.0 in | Wt 271.5 lb

## 2022-04-12 DIAGNOSIS — I2510 Atherosclerotic heart disease of native coronary artery without angina pectoris: Secondary | ICD-10-CM | POA: Diagnosis not present

## 2022-04-12 DIAGNOSIS — I428 Other cardiomyopathies: Secondary | ICD-10-CM | POA: Diagnosis not present

## 2022-04-12 DIAGNOSIS — I10 Essential (primary) hypertension: Secondary | ICD-10-CM | POA: Diagnosis not present

## 2022-04-12 DIAGNOSIS — I5022 Chronic systolic (congestive) heart failure: Secondary | ICD-10-CM | POA: Diagnosis not present

## 2022-04-12 DIAGNOSIS — R918 Other nonspecific abnormal finding of lung field: Secondary | ICD-10-CM

## 2022-04-12 MED ORDER — ENTRESTO 49-51 MG PO TABS: 1.0000 | ORAL_TABLET | Freq: Two times a day (BID) | ORAL | 6 refills | Status: DC

## 2022-04-12 NOTE — Progress Notes (Signed)
Cardiology Office Note    Date:  04/12/2022   ID:  Emily Hinton, DOB 1961-07-02, MRN [id number]  PCP:  Patient, No Pcp Per  Cardiologist:  Julien Nordmann, MD  Electrophysiologist:  None   Chief Complaint: Follow up  History of Present Illness:   Emily Hinton is a 61 y.o. 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   [name] was previously followed by cardiology in [place]. She was found to have a cardiomyopathy in 2018 with an EF of 45%. LHC at that time showed nonobstructive coronary arteries.  Echo in 05/2018 demonstrated a persistent cardiomyopathy with an EF of 45%, global hypokinesis, and diastolic dysfunction. This finding was unchanged when compared to echo in 2018.  Subsequent echo in 04/2021 showed an improved LV systolic function with an EF of 55%, probable normal LV wall motion, and diastolic dysfunction. She moved to [facility] from [place] about 2.5 years ago.    She was admitted to the hospital in 01/2022 with worsening dyspnea and productive cough and found to have multilobar pneumonia with mild volume overload.  She was hypertensive and tachycardic upon presentation with an oxygen saturation of 72% on room air.  High-sensitivity troponin 35 with a delta troponin of 20.  BNP 189.  Chest x-ray showed volume overload versus pneumonia with small pleural effusions.  CT of the chest showed multilobar pneumonia with minimal pleural fluid on the left.  Echo during this admission showed an EF of 40-45%, mildly to moderately dilated LV internal cavity size, global hypokinesis, Gr2DD, normal RV systolic function and ventricular cavity size, and mild mitral  regurgitation.  She was last seen in the office on 02/22/2022 and was without symptoms of angina or decompensation.  She had stable 2-3 pillow orthopnea which is related to underlying prior CSF leak.  Coronary CTA on 03/01/2022 showed a calcium score of 0, with no evidence of CAD.  Noncardiac over read was notable for near complete resolution of groundglass opacities when compared to imaging from 01/2022.  There were bandlike changes along the posterior pleural space felt to likely reflect a mixture of atelectasis and postinflammatory changes that were also improving.  It was recommended she follow-up with her PCP for these improving changes.  She comes in doing well from a cardiac perspective and is without symptoms of angina or decompensation.  No significant lower extremity swelling, abdominal distention, or early satiety.  She has stable 2-3 pillow orthopnea given prior CSF leak as outlined above.  Since she was last seen, she was seen in [place] and placed on lisinopril.  She remains on Entresto.  She has noted some intermittent swelling of her tongue, though none currently.  She negates her blood pressure has remained well controlled.  She watches her salt and fluid intake.   Labs independently reviewed: 03/2022 - A1c 8.8, potassium 4.0, BUN 15, SCr 1.1, TC 177, TG 142, HDL 50, LDL 98 09/2021 - TSH normal, magnesium 2.0 03/2021 - albumin 4.7, AST/ALT normal  Past Medical History:  Diagnosis Date   CHF (congestive heart failure) (HCC)    CSF leak    Diabetes mellitus without complication (HCC)    DM2 (  diabetes mellitus, type 2) (HCC)    Essential hypertension    Hyperlipidemia    Morbid obesity (HCC)    NICM (nonischemic cardiomyopathy) (HCC)    Nonobstructive atherosclerosis of coronary artery    OSA (obstructive sleep apnea)     Past Surgical History:  Procedure Laterality Date   CSF SHUNT  2017   herniated disc  2017    Current Medications: Current Meds  Medication Sig    acetaminophen (TYLENOL) 500 MG tablet Take 500 mg by mouth every 6 (six) hours as needed.   aspirin 81 MG chewable tablet Chew 81 mg by mouth daily.   carvedilol (COREG) 6.25 MG tablet Take 1 tablet by mouth 2 (two) times daily.   empagliflozin (JARDIANCE) 10 MG TABS tablet Take 1 tablet (10 mg) by mouth once daily   gabapentin (NEURONTIN) 600 MG tablet Take 600 mg by mouth 3 (three) times daily.   insulin detemir (LEVEMIR) 100 UNIT/ML injection Inject 40 Units into the skin at bedtime.   metFORMIN (GLUCOPHAGE) 1000 MG tablet Take 1,000 mg by mouth 2 (two) times daily with a meal.   omeprazole (PRILOSEC) 20 MG capsule Take 20 mg by mouth daily.   sacubitril-valsartan (ENTRESTO) 49-51 MG Take 1 tablet by mouth 2 (two) times daily.   Semaglutide (OZEMPIC, 0.25 OR 0.5 MG/DOSE, [place]) Inject 0.5 mg into the skin once a week.   spironolactone (ALDACTONE) 25 MG tablet Take 25 mg by mouth daily.   torsemide (DEMADEX) 20 MG tablet Take 20 mg by mouth daily.   [DISCONTINUED] amLODipine (NORVASC) 2.5 MG tablet Take 2.5 mg by mouth daily.   [DISCONTINUED] lisinopril (ZESTRIL) 40 MG tablet Take 40 mg by mouth daily.   [DISCONTINUED] sacubitril-valsartan (ENTRESTO) 24-26 MG Take 1 tablet by mouth 2 (two) times daily.    Allergies:   Cashew nut oil   Social History   Socioeconomic History   Marital status: Divorced    Spouse name: Not on file   Number of children: Not on file   Years of education: Not on file   Highest education level: Not on file  Occupational History   Not on file  Tobacco Use   Smoking status: Never   Smokeless tobacco: Never  Vaping Use   Vaping Use: Never used  Substance and Sexual Activity   Alcohol use: Never   Drug use: Never   Sexual activity: Not on file  Other Topics Concern   Not on file  Social History Narrative   Not on file   Social Determinants of Health   Financial Resource Strain: Not on file  Food Insecurity: Not on file  Transportation Needs: Not on  file  Physical Activity: Not on file  Stress: Not on file  Social Connections: Not on file     Family History:  The patient's family history includes Heart failure in her father; Hypertension in her father; Kidney disease in her father.  ROS:   12-point review of systems is negative unless otherwise noted in the HPI.   EKGs/Labs/Other Studies Reviewed:    Studies reviewed were summarized above. The additional studies were reviewed today:  2D echo 01/30/2022: 1. Left ventricular ejection fraction, by estimation, is 40 to 45%. The  left ventricle has mildly decreased function. The left ventricle  demonstrates global hypokinesis. The left ventricular internal cavity size  was mildly to moderately dilated. Left  ventricular diastolic parameters are consistent with Grade II diastolic  dysfunction (pseudonormalization). Elevated left atrial pressure.   2. Right  ventricular systolic function is normal. The right ventricular  size is normal. Tricuspid regurgitation signal is inadequate for assessing  PA pressure.   3. The mitral valve is normal in structure. Mild mitral valve  regurgitation. No evidence of mitral stenosis.   4. The aortic valve was not well visualized. Aortic valve regurgitation  is not visualized. No aortic stenosis is present. __________  Coronary CTA 03/01/2022: Noncardiac over read -  1. Near complete resolution of ground-glass opacities in the chest when compared to the recent study from April of 2023. 2. Bandlike changes along the posterior pleural surface likely a mixture of atelectasis and post inflammatory changes also showing improvement since previous imaging. This may reflect developing post inflammatory fibrosis with and is mixed atelectatic changes at the lung bases, suggest attention on follow-up.  Cardiac overread -  Aorta:  Normal size.  No calcifications.  No dissection.   Aortic Valve:  Trileaflet.  No calcifications.   Coronary Arteries:   Normal coronary origin.  Right dominance.   RCA is a dominant artery that gives rise to PDA and PLA. There is no plaque.   Left main gives rise to LAD and LCX arteries. There is no LM disease.   LAD has no plaque.   LCX is a non-dominant artery that gives rise to a large obtuse marginal branch. There is no plaque.   Other findings:   Normal pulmonary vein drainage into the left atrium.   Normal left atrial appendage without a thrombus.   Normal size of the pulmonary artery.   IMPRESSION: 1. Normal coronary calcium score of 0. Patient is low risk for coronary events. 2. Normal coronary origin with right dominance. 3. No evidence of CAD. 4. CAD-RADS 0. Consider non-atherosclerotic causes of chest pain.   EKG:  EKG is ordered today.  The EKG ordered today demonstrates NSR, 69 bpm, left axis deviation, baseline artifact along the V5, no acute ST-T changes  Recent Labs: 01/30/2022: B Natriuretic Peptide 189.7 02/03/2022: BUN 22; Creatinine, Ser 1.07; Hemoglobin 13.8; Platelets 330; Potassium 4.4; Sodium 137  Recent Lipid Panel No results found for: "CHOL", "TRIG", "HDL", "CHOLHDL", "VLDL", "LDLCALC", "LDLDIRECT"  PHYSICAL EXAM:    VS:  BP 120/80 (BP Location: Left Arm, Patient Position: Sitting, Cuff Size: Large)   Pulse 69   Ht 5\' 11"  (1.803 m)   Wt 271 lb 8 oz (123.2 kg)   SpO2 98%   BMI 37.87 kg/m   BMI: Body mass index is 37.87 kg/m.  Physical Exam Vitals reviewed.  Constitutional:      Appearance: She is well-developed.  HENT:     Head: Normocephalic and atraumatic.  Eyes:     General:        Right eye: No discharge.        Left eye: No discharge.  Neck:     Vascular: No JVD.  Cardiovascular:     Rate and Rhythm: Normal rate and regular rhythm.     Heart sounds: Normal heart sounds, S1 normal and S2 normal. Heart sounds not distant. No midsystolic click and no opening snap. No murmur heard.    No friction rub.  Pulmonary:     Effort: Pulmonary effort is  normal. No respiratory distress.     Breath sounds: Normal breath sounds. No decreased breath sounds, wheezing or rales.  Chest:     Chest wall: No tenderness.  Abdominal:     General: There is no distension.     Palpations: Abdomen is soft.  Tenderness: There is no abdominal tenderness.  Musculoskeletal:     Cervical back: Normal range of motion.     Right lower leg: No edema.     Left lower leg: No edema.  Skin:    General: Skin is warm and dry.     Nails: There is no clubbing.  Neurological:     Mental Status: She is alert and oriented to person, place, and time.  Psychiatric:        Speech: Speech normal.        Behavior: Behavior normal.        Thought Content: Thought content normal.        Judgment: Judgment normal.     Wt Readings from Last 3 Encounters:  04/12/22 271 lb 8 oz (123.2 kg)  02/22/22 273 lb 2 oz (123.9 kg)  02/13/22 269 lb 5 oz (122.2 kg)     ASSESSMENT & PLAN:   HFrEF secondary to NICM: She appears euvolemic and well compensated.  Since we last saw her, she has been started on lisinopril.  We will discontinue this medication given that she is on Entresto.  In an effort to further optimize her GDMT, we will also discontinue amlodipine and titrate Entresto to 49/51 mg twice daily.  She will otherwise continue current doses of carvedilol, spironolactone, torsemide, and Jardiance.  We will plan for a repeat limited echo in 2 months time to reevaluate her LV systolic function on maximally tolerated GDMT.  CHF education.  Recent labs earlier this month showed a stable renal function and electrolytes.  Nonobstructive CAD: No symptoms concerning for angina.  Continue aggressive risk factor modification and primary prevention including aspirin and carvedilol.  Coronary CTA showed a calcium score of 0.  Would also consider initiation of statin given underlying diabetes.  This will be deferred to her PCP.  HTN: Blood pressure is well controlled in the office today.   Continue current medications as outlined above.  Abnormal CT chest: Noncardiac over read of coronary CTA of the chest showed improving inflammatory changes as well as near complete resolution of groundglass opacities.  We will follow-up a noncontrast chest CT in 3 months.    Disposition: F/u with Dr. Mariah Milling or an APP in 1 month.   Medication Adjustments/Labs and Tests Ordered: Current medicines are reviewed at length with the patient today.  Concerns regarding medicines are outlined above. Medication changes, Labs and Tests ordered today are summarized above and listed in the Patient Instructions accessible in Encounters.   Signed, Eula Listen, PA-C 04/12/2022 2:28 PM     [facility] [address] [place], [place] [address] [phone]

## 2022-04-12 NOTE — Patient Instructions (Signed)
Medication Instructions:  - Your physician has recommended you make the following change in your medication:   1) STOP Lisinopril  2) STOP Amlodipine  3) INCREASE Entresto to 49/51 mg: - take 1 tablet by mouth TWICE daily   *If you need a refill on your cardiac medications before your next appointment, please call your pharmacy*   Lab Work: - none ordered  If you have labs (blood work) drawn today and your tests are completely normal, you will receive your results only by: MyChart Message (if you have MyChart) OR A paper copy in the mail If you have any lab test that is abnormal or we need to change your treatment, we will call you to review the results.   Testing/Procedures:  1) Limited Echocardiogram: in 2 months - Your physician has requested that you have an echocardiogram (limited). Echocardiography is a painless test that uses sound waves to create images of your heart. It provides your doctor with information about the size and shape of your heart and how well your heart's chambers and valves are working. This procedure takes approximately one hour. There are no restrictions for this procedure. There is a possibility that an IV may need to be started during your test to inject an image enhancing agent. This is done to obtain more optimal pictures of your heart. Therefore we ask that you do at least drink some water prior to coming in to hydrate your veins.    Follow-Up: At [facility], you and your health needs are our priority.  As part of our continuing mission to provide you with exceptional heart care, we have created designated Provider Care Teams.  These Care Teams include your primary Cardiologist (physician) and Advanced Practice Providers (APPs -  Physician Assistants and Nurse Practitioners) who all work together to provide you with the care you need, when you need it.  We recommend signing up for the patient portal called "MyChart".  Sign up information is provided  on this After Visit Summary.  MyChart is used to connect with patients for Virtual Visits (Telemedicine).  Patients are able to view lab/test results, encounter notes, upcoming appointments, etc.  Non-urgent messages can be sent to your provider as well.   To learn more about what you can do with MyChart, go to [email or web address].    Your next appointment:   1 month(s)  The format for your next appointment:   In Person  Provider:   Eula Listen, PA-C    Other Instructions  Echocardiogram An echocardiogram is a test that uses sound waves (ultrasound) to produce images of the heart. Images from an echocardiogram can provide important information about: Heart size and shape. The size and thickness and movement of your heart's walls. Heart muscle function and strength. Heart valve function or if you have stenosis. Stenosis is when the heart valves are too narrow. If blood is flowing backward through the heart valves (regurgitation). A tumor or infectious growth around the heart valves. Areas of heart muscle that are not working well because of poor blood flow or injury from a heart attack. Aneurysm detection. An aneurysm is a weak or damaged part of an artery wall. The wall bulges out from the normal force of blood pumping through the body. Tell a health care provider about: Any allergies you have. All medicines you are taking, including vitamins, herbs, eye drops, creams, and over-the-counter medicines. Any blood disorders you have. Any surgeries you have had. Any medical conditions you have. Whether you  are pregnant or may be pregnant. What are the risks? Generally, this is a safe test. However, problems may occur, including an allergic reaction to dye (contrast) that may be used during the test. What happens before the test? No specific preparation is needed. You may eat and drink normally. What happens during the test?  You will take off your clothes from the waist up and put  on a hospital gown. Electrodes or electrocardiogram (ECG)patches may be placed on your chest. The electrodes or patches are then connected to a device that monitors your heart rate and rhythm. You will lie down on a table for an ultrasound exam. A gel will be applied to your chest to help sound waves pass through your skin. A handheld device, called a transducer, will be pressed against your chest and moved over your heart. The transducer produces sound waves that travel to your heart and bounce back (or "echo" back) to the transducer. These sound waves will be captured in real-time and changed into images of your heart that can be viewed on a video monitor. The images will be recorded on a computer and reviewed by your health care provider. You may be asked to change positions or hold your breath for a short time. This makes it easier to get different views or better views of your heart. In some cases, you may receive contrast through an IV in one of your veins. This can improve the quality of the pictures from your heart. The procedure may vary among health care providers and hospitals. What can I expect after the test? You may return to your normal, everyday life, including diet, activities, and medicines, unless your health care provider tells you not to do that. Follow these instructions at home: It is up to you to get the results of your test. Ask your health care provider, or the department that is doing the test, when your results will be ready. Keep all follow-up visits. This is important. Summary An echocardiogram is a test that uses sound waves (ultrasound) to produce images of the heart. Images from an echocardiogram can provide important information about the size and shape of your heart, heart muscle function, heart valve function, and other possible heart problems. You do not need to do anything to prepare before this test. You may eat and drink normally. After the echocardiogram is  completed, you may return to your normal, everyday life, unless your health care provider tells you not to do that. This information is not intended to replace advice given to you by your health care provider. Make sure you discuss any questions you have with your health care provider. Document Revised: 06/14/2021 Document Reviewed: 05/24/2020 Elsevier Patient Education  2023 Elsevier Inc.   Important Information About Sugar

## 2022-04-13 ENCOUNTER — Telehealth: Payer: Self-pay | Admitting: Physician Assistant

## 2022-04-13 NOTE — Telephone Encounter (Signed)
Please advise for correct alternative dose for Ozempic. Options are given when submitting refill.

## 2022-04-13 NOTE — Telephone Encounter (Signed)
*  STAT* If patient is at the pharmacy, call can be transferred to refill team.   1. Which medications need to be refilled? (please list name of each medication and dose if known) Semaglutide (OZEMPIC, 0.25 OR 0.5 MG/DOSE, [place])  2. Which pharmacy/location (including street and city if local pharmacy) is medication to be sent to? [id number] - [place], [place] - [address] OF [address]  3. Do they need a 30 day or 90 day supply? 30 day

## 2022-04-16 NOTE — Telephone Encounter (Signed)
Left voicemail message to call back regarding her medication.

## 2022-04-18 NOTE — Addendum Note (Signed)
Addended by: Festus Aloe on: 04/18/2022 11:23 AM   Modules accepted: Orders

## 2022-04-20 ENCOUNTER — Telehealth: Payer: Self-pay | Admitting: Family

## 2022-04-20 ENCOUNTER — Ambulatory Visit: Payer: Self-pay | Admitting: Family

## 2022-04-20 NOTE — Telephone Encounter (Signed)
Patient did not show for her [facility] appointment on 04/20/22. Will attempt to reschedule.

## 2022-04-26 NOTE — Telephone Encounter (Addendum)
Called pharmacy but was on hold and then it disconnected. Will try to call back tomorrow.

## 2022-04-26 NOTE — Telephone Encounter (Signed)
Left voicemail message on patients number to call back in regards to her medication.

## 2022-04-30 NOTE — Telephone Encounter (Signed)
Spoke with patient and advised that the lower doses are on back order at this time. Reviewed that I would forward this over to the provider and we can review at her upcoming appointment. She verbalized understanding with no further questions at this time.

## 2022-05-15 ENCOUNTER — Other Ambulatory Visit: Payer: Medicare (Managed Care)

## 2022-05-15 NOTE — Progress Notes (Unsigned)
Cardiology Office Note    Date:  05/17/2022   ID:  Emily Hinton, DOB September 10, 1961, MRN [id number]  PCP:  Patient, No Pcp Per  Cardiologist:  Ida Rogue, MD  Electrophysiologist:  None   Chief Complaint: Follow-up  History of Present Illness:   Emily Hinton is a 61 y.o. 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   Emily Hinton was previously followed by cardiology in [place]. She was found to have a cardiomyopathy in 2018 with an EF of 45%. LHC at that time showed nonobstructive coronary arteries.  Echo in 05/2018 demonstrated a persistent cardiomyopathy with an EF of 45%, global hypokinesis, and diastolic dysfunction. This finding was unchanged when compared to echo in 2018.  Subsequent echo in 04/2021 showed an improved LV systolic function with an EF of 55%, probable normal LV wall motion, and diastolic dysfunction. She moved to [facility] from [place] several years ago.    She was admitted to the hospital in 01/2022 with worsening dyspnea and productive cough and found to have multilobar pneumonia with mild volume overload.  She was hypertensive and tachycardic upon presentation with an oxygen saturation of 72% on room air.  High-sensitivity troponin 35 with a delta troponin of 20.  BNP 189.  Chest x-ray showed volume overload versus pneumonia with small pleural effusions.  CT of the chest showed multilobar pneumonia with minimal pleural fluid on the left.  Echo during this admission showed an EF of 40-45%, mildly to moderately dilated LV internal cavity size, global hypokinesis, Gr2DD, normal RV systolic function and ventricular cavity size, and mild mitral  regurgitation.     Coronary CTA on 03/01/2022 showed a calcium score of 0, with no evidence of CAD.  Noncardiac over read was notable for near complete resolution of groundglass opacities when compared to imaging from 01/2022.  There were bandlike changes along the posterior pleural space felt to likely reflect a mixture of atelectasis and postinflammatory changes that were also improving.  It was recommended she follow-up with her PCP for these improving changes.  She was last seen in the office on 04/12/2022 and was without symptoms of angina or decompensation.  She had stable 2-3 pillow orthopnea given prior CSF leak.  She remained on Entresto and had been started on lisinopril by outside office.  It was recommended that she discontinue lisinopril.  Amlodipine was also discontinued and Entresto was titrated to 49/51 mg twice daily.  She was otherwise continued on carvedilol, spironolactone, torsemide, and Jardiance.  She comes in doing well from a cardiac perspective and is without symptoms of angina or decompensation.  She notes some stable, chronic dyspnea, particularly when ambulating up stairs.  Her weight remains stable.  She has stable 2-3 pillow orthopnea given prior CSF leak.  She is watching her salt intake and drinking less than 2 L of fluid per day.  She is under increased stress in the setting of her mother's health.     Labs independently reviewed: 03/2022 - A1c 8.8, potassium 4.0, BUN 15, SCr 1.1, TC 177, TG 142, HDL 50, LDL 98 09/2021 - TSH normal, magnesium 2.0 03/2021 - albumin 4.7, AST/ALT normal    Past  Medical History:  Diagnosis Date   CHF (congestive heart failure) (HCC)    CSF leak    Diabetes mellitus without complication (HCC)    DM2 (diabetes mellitus, type 2) (HCC)    Essential hypertension    Hyperlipidemia    Morbid obesity (HCC)    NICM (nonischemic cardiomyopathy) (HCC)    Nonobstructive atherosclerosis of coronary artery    OSA (obstructive sleep apnea)      Past Surgical History:  Procedure Laterality Date   CSF SHUNT  2017   herniated disc  2017    Current Medications: Current Meds  Medication Sig   acetaminophen (TYLENOL) 500 MG tablet Take 500 mg by mouth every 6 (six) hours as needed.   aspirin 81 MG chewable tablet Chew 81 mg by mouth daily.   carvedilol (COREG) 12.5 MG tablet Take 1 tablet (12.5 mg total) by mouth 2 (two) times daily.   empagliflozin (JARDIANCE) 10 MG TABS tablet Take 1 tablet (10 mg) by mouth once daily   gabapentin (NEURONTIN) 600 MG tablet Take 600 mg by mouth 3 (three) times daily.   insulin detemir (LEVEMIR) 100 UNIT/ML injection Inject 40 Units into the skin at bedtime.   insulin regular (NOVOLIN R) 100 units/mL injection Inject into the skin.   metFORMIN (GLUCOPHAGE) 1000 MG tablet Take 1,000 mg by mouth 2 (two) times daily with a meal.   omeprazole (PRILOSEC) 20 MG capsule Take 20 mg by mouth daily.   sacubitril-valsartan (ENTRESTO) 97-103 MG Take 1 tablet by mouth 2 (two) times daily.   Semaglutide (OZEMPIC, 0.25 OR 0.5 MG/DOSE, [place]) Inject 0.5 mg into the skin once a week.   spironolactone (ALDACTONE) 25 MG tablet Take 25 mg by mouth daily.   torsemide (DEMADEX) 20 MG tablet Take 20 mg by mouth daily.   [DISCONTINUED] carvedilol (COREG) 6.25 MG tablet Take 1 tablet by mouth 2 (two) times daily.   [DISCONTINUED] sacubitril-valsartan (ENTRESTO) 49-51 MG Take 1 tablet by mouth 2 (two) times daily.    Allergies:   Cashew nut oil   Social History   Socioeconomic History   Marital status: Divorced    Spouse name: Not on file   Number of children: Not on file   Years of education: Not on file   Highest education level: Not on file  Occupational History   Not on file  Tobacco Use   Smoking status: Never   Smokeless tobacco: Never  Vaping Use   Vaping Use: Never used  Substance and Sexual Activity   Alcohol use: Never   Drug use: Never   Sexual activity: Not on file  Other Topics Concern   Not  on file  Social History Narrative   Not on file   Social Determinants of Health   Financial Resource Strain: Not on file  Food Insecurity: Not on file  Transportation Needs: Not on file  Physical Activity: Not on file  Stress: Not on file  Social Connections: Not on file     Family History:  The patient's family history includes Heart failure in her father; Hypertension in her father; Kidney disease in her father.  ROS:   12-point review of systems is negative unless otherwise noted in the HPI.   EKGs/Labs/Other Studies Reviewed:    Studies reviewed were summarized above. The additional studies were reviewed today:  2D echo 01/30/2022: 1. Left ventricular ejection fraction, by estimation, is 40 to 45%. The  left ventricle has mildly decreased function. The left ventricle  demonstrates global hypokinesis.  The left ventricular internal cavity size  was mildly to moderately dilated. Left  ventricular diastolic parameters are consistent with Grade II diastolic  dysfunction (pseudonormalization). Elevated left atrial pressure.   2. Right ventricular systolic function is normal. The right ventricular  size is normal. Tricuspid regurgitation signal is inadequate for assessing  PA pressure.   3. The mitral valve is normal in structure. Mild mitral valve  regurgitation. No evidence of mitral stenosis.   4. The aortic valve was not well visualized. Aortic valve regurgitation  is not visualized. No aortic stenosis is present. __________   Coronary CTA 03/01/2022: Noncardiac over read -  1. Near complete resolution of ground-glass opacities in the chest when compared to the recent study from April of 2023. 2. Bandlike changes along the posterior pleural surface likely a mixture of atelectasis and post inflammatory changes also showing improvement since previous imaging. This may reflect developing post inflammatory fibrosis with and is mixed atelectatic changes at the lung bases,  suggest attention on follow-up.   Cardiac overread -  Aorta:  Normal size.  No calcifications.  No dissection.   Aortic Valve:  Trileaflet.  No calcifications.   Coronary Arteries:  Normal coronary origin.  Right dominance.   RCA is a dominant artery that gives rise to PDA and PLA. There is no plaque.   Left main gives rise to LAD and LCX arteries. There is no LM disease.   LAD has no plaque.   LCX is a non-dominant artery that gives rise to a large obtuse marginal branch. There is no plaque.   Other findings:   Normal pulmonary vein drainage into the left atrium.   Normal left atrial appendage without a thrombus.   Normal size of the pulmonary artery.   IMPRESSION: 1. Normal coronary calcium score of 0. Patient is low risk for coronary events. 2. Normal coronary origin with right dominance. 3. No evidence of CAD. 4. CAD-RADS 0. Consider non-atherosclerotic causes of chest pain.   EKG:  EKG is ordered today.  The EKG ordered today demonstrates NSR, 87 bpm, LVH, poor R wave progression along the precordial leads, lateral T wave inversion consistent with prior tracings  Recent Labs: 01/30/2022: B Natriuretic Peptide 189.7 02/03/2022: Hemoglobin 13.8; Platelets 330 05/17/2022: BUN 18; Creatinine, Ser 1.36; Potassium 3.9; Sodium 139  Recent Lipid Panel No results found for: "CHOL", "TRIG", "HDL", "CHOLHDL", "VLDL", "LDLCALC", "LDLDIRECT"  PHYSICAL EXAM:    VS:  BP 130/78 (BP Location: Left Arm, Patient Position: Sitting, Cuff Size: Large)   Pulse 87   Ht 5\' 11"  (1.803 m)   Wt 271 lb 6 oz (123.1 kg)   SpO2 98%   BMI 37.85 kg/m   BMI: Body mass index is 37.85 kg/m.  Physical Exam Constitutional:      Appearance: She is well-developed.  HENT:     Head: Normocephalic and atraumatic.  Eyes:     General:        Right eye: No discharge.        Left eye: No discharge.  Neck:     Vascular: No JVD.  Cardiovascular:     Rate and Rhythm: Normal rate and regular rhythm.      Pulses:          Posterior tibial pulses are 2+ on the right side and 2+ on the left side.     Heart sounds: Normal heart sounds, S1 normal and S2 normal. Heart sounds not distant. No midsystolic click and no opening snap. No  murmur heard.    No friction rub.  Pulmonary:     Effort: Pulmonary effort is normal. No respiratory distress.     Breath sounds: Normal breath sounds. No decreased breath sounds, wheezing or rales.  Chest:     Chest wall: No tenderness.  Abdominal:     General: There is no distension.  Musculoskeletal:     Cervical back: Normal range of motion.     Right lower leg: No edema.     Left lower leg: No edema.  Skin:    General: Skin is warm and dry.     Nails: There is no clubbing.  Neurological:     Mental Status: She is alert and oriented to person, place, and time.  Psychiatric:        Speech: Speech normal.        Behavior: Behavior normal.        Thought Content: Thought content normal.        Judgment: Judgment normal.     Wt Readings from Last 3 Encounters:  05/17/22 271 lb 6 oz (123.1 kg)  04/12/22 271 lb 8 oz (123.2 kg)  02/22/22 273 lb 2 oz (123.9 kg)     ASSESSMENT & PLAN:   HFrEF secondary to NICM: She appears euvolemic and well compensated with NYHA class II symptoms.  Titrate Entresto to 97/103 mg twice daily and carvedilol to 12.5 mg twice daily.  Otherwise, she remains on Jardiance, spironolactone, and torsemide at current doses.  Check BMP.  She is scheduled for a limited echo next month to reevaluate LV systolic function on maximally tolerated GDMT.  CHF education.  Nonobstructive CAD: No symptoms suggestive of chest pain.  Recent coronary CTA showed a calcium score of 0.  Continue aggressive risk factor modification and primary prevention including aspirin and carvedilol.  Would consider the initiation of statin given underlying diabetes, this will be deferred to her PCP.  HTN: Blood pressure is reasonably controlled in the office  today.  Continue medical therapy as outlined above.  Abnormal chest CT: Noncardiac over read of coronary CTA showed improving inflammatory changes as well as near complete resolution of groundglass opacities.  Plan to follow-up chest CT after her next visit.   Disposition: F/u with Dr. Rockey Situ or an APP in 6 weeks.   Medication Adjustments/Labs and Tests Ordered: Current medicines are reviewed at length with the patient today.  Concerns regarding medicines are outlined above. Medication changes, Labs and Tests ordered today are summarized above and listed in the Patient Instructions accessible in Encounters.   Signed, Christell Faith, PA-C 05/17/2022 11:58 AM     [place] [address] [place], [place] [address] [phone]

## 2022-05-17 ENCOUNTER — Encounter: Payer: Self-pay | Admitting: Physician Assistant

## 2022-05-17 ENCOUNTER — Ambulatory Visit (INDEPENDENT_AMBULATORY_CARE_PROVIDER_SITE_OTHER): Payer: Medicare (Managed Care) | Admitting: Physician Assistant

## 2022-05-17 ENCOUNTER — Other Ambulatory Visit
Admission: RE | Admit: 2022-05-17 | Discharge: 2022-05-17 | Disposition: A | Discharge status: Home / Self Care | Payer: Medicare (Managed Care) | Attending: Physician Assistant | Admitting: Physician Assistant

## 2022-05-17 ENCOUNTER — Telehealth: Payer: Self-pay | Admitting: *Deleted

## 2022-05-17 VITALS — BP 130/78 | HR 87 | Ht 71.0 in | Wt 271.4 lb

## 2022-05-17 DIAGNOSIS — I428 Other cardiomyopathies: Secondary | ICD-10-CM | POA: Insufficient documentation

## 2022-05-17 DIAGNOSIS — I2510 Atherosclerotic heart disease of native coronary artery without angina pectoris: Secondary | ICD-10-CM

## 2022-05-17 DIAGNOSIS — R918 Other nonspecific abnormal finding of lung field: Secondary | ICD-10-CM

## 2022-05-17 DIAGNOSIS — I5022 Chronic systolic (congestive) heart failure: Secondary | ICD-10-CM

## 2022-05-17 DIAGNOSIS — I10 Essential (primary) hypertension: Secondary | ICD-10-CM | POA: Insufficient documentation

## 2022-05-17 LAB — BASIC METABOLIC PANEL
Anion gap: 10 (ref 5–15)
BUN: 18 mg/dL (ref 6–20)
CO2: 24 mmol/L (ref 22–32)
Calcium: 9 mg/dL (ref 8.9–10.3)
Chloride: 105 mmol/L (ref 98–111)
Creatinine, Ser: 1.36 mg/dL — ABNORMAL HIGH (ref 0.44–1.00)
GFR, Estimated: 45 mL/min — ABNORMAL LOW (ref >60)
Glucose, Bld: 290 mg/dL — ABNORMAL HIGH (ref 70–99)
Potassium: 3.9 mmol/L (ref 3.5–5.1)
Sodium: 139 mmol/L (ref 135–145)

## 2022-05-17 MED ORDER — ENTRESTO 97-103 MG PO TABS: 1.0000 | ORAL_TABLET | Freq: Two times a day (BID) | ORAL | 3 refills | Status: DC

## 2022-05-17 MED ORDER — CARVEDILOL 12.5 MG PO TABS: 12.5000 mg | ORAL_TABLET | Freq: Two times a day (BID) | ORAL | 3 refills | Status: DC

## 2022-05-17 NOTE — Telephone Encounter (Signed)
-----   Message from Sondra Barges, PA-C sent at 05/17/2022 11:19 AM EDT ----- Please inform the patient her glucose is elevated with known diabetes.  Potassium okay.  Renal function is a slightly higher than her prior readings.  Please have her hold torsemide and take 20 mg only as needed for weight gain greater than 3 pounds overnight or 5 pounds in a week.  Continue with planned medication titration as discussed at our visit today.  I would like a follow-up BMP in 1 week to ensure her renal function has trended back down to her baseline on as needed torsemide.

## 2022-05-17 NOTE — Patient Instructions (Signed)
Medication Instructions:  Your physician has recommended you make the following change in your medication:   INCREASE Carvedilol to 12.5 mg twice a day INCREASE Entresto to 97-103 mg twice a day  *If you need a refill on your cardiac medications before your next appointment, please call your pharmacy*   Lab Work: BMET today over at [employer] at [facility] go to the 1st desk on the right to check in (REGISTRATION)     If you have labs (blood work) drawn today and your tests are completely normal, you will receive your results only by: MyChart Message (if you have MyChart) OR A paper copy in the mail If you have any lab test that is abnormal or we need to change your treatment, we will call you to review the results.   Testing/Procedures: None   Follow-Up: At [facility], you and your health needs are our priority.  As part of our continuing mission to provide you with exceptional heart care, we have created designated Provider Care Teams.  These Care Teams include your primary Cardiologist (physician) and Advanced Practice Providers (APPs -  Physician Assistants and Nurse Practitioners) who all work together to provide you with the care you need, when you need it.  We recommend signing up for the patient portal called "MyChart".  Sign up information is provided on this After Visit Summary.  MyChart is used to connect with patients for Virtual Visits (Telemedicine).  Patients are able to view lab/test results, encounter notes, upcoming appointments, etc.  Non-urgent messages can be sent to your provider as well.   To learn more about what you can do with MyChart, go to [email or web address].    Your next appointment:   6 week(s)  The format for your next appointment:   In Person  Provider:   Julien Nordmann, MD or Eula Listen, PA-C      Important Information About Sugar

## 2022-05-17 NOTE — Telephone Encounter (Signed)
Left voicemail message to call back for review of results and recommendations.  

## 2022-05-21 NOTE — Telephone Encounter (Signed)
Attempted to call the patient. No answer- I left a message to please call back.  

## 2022-05-24 NOTE — Telephone Encounter (Signed)
Left voicemail message to call back for review of results and recommendations.  

## 2022-05-25 MED ORDER — TORSEMIDE 20 MG PO TABS: 20.0000 mg | ORAL_TABLET | Freq: Every day | ORAL | 4 refills | Status: DC | PRN

## 2022-05-25 NOTE — Telephone Encounter (Signed)
Reviewed results and recommendations with patient. She verbalized understanding, agreeable with plan, and had no further questions at this time.  

## 2022-05-25 NOTE — Telephone Encounter (Signed)
Patient is returning call.  °

## 2022-05-31 ENCOUNTER — Other Ambulatory Visit
Admission: RE | Admit: 2022-05-31 | Discharge: 2022-05-31 | Disposition: A | Discharge status: Home / Self Care | Payer: Medicare (Managed Care) | Attending: Physician Assistant | Admitting: Physician Assistant

## 2022-05-31 DIAGNOSIS — I5022 Chronic systolic (congestive) heart failure: Secondary | ICD-10-CM | POA: Insufficient documentation

## 2022-05-31 DIAGNOSIS — I2510 Atherosclerotic heart disease of native coronary artery without angina pectoris: Secondary | ICD-10-CM | POA: Insufficient documentation

## 2022-05-31 DIAGNOSIS — I428 Other cardiomyopathies: Secondary | ICD-10-CM | POA: Insufficient documentation

## 2022-05-31 LAB — BASIC METABOLIC PANEL
Anion gap: 9 (ref 5–15)
BUN: 14 mg/dL (ref 6–20)
CO2: 26 mmol/L (ref 22–32)
Calcium: 9.4 mg/dL (ref 8.9–10.3)
Chloride: 102 mmol/L (ref 98–111)
Creatinine, Ser: 0.93 mg/dL (ref 0.44–1.00)
GFR, Estimated: >60 mL/min (ref >60)
Glucose, Bld: 170 mg/dL — ABNORMAL HIGH (ref 70–99)
Potassium: 4 mmol/L (ref 3.5–5.1)
Sodium: 137 mmol/L (ref 135–145)

## 2022-06-21 ENCOUNTER — Ambulatory Visit: Payer: Medicare (Managed Care) | Attending: Physician Assistant

## 2022-06-21 DIAGNOSIS — E119 Type 2 diabetes mellitus without complications: Secondary | ICD-10-CM | POA: Insufficient documentation

## 2022-06-21 DIAGNOSIS — I428 Other cardiomyopathies: Secondary | ICD-10-CM | POA: Diagnosis not present

## 2022-06-21 DIAGNOSIS — I5020 Unspecified systolic (congestive) heart failure: Secondary | ICD-10-CM | POA: Insufficient documentation

## 2022-06-21 DIAGNOSIS — I110 Hypertensive heart disease with heart failure: Secondary | ICD-10-CM | POA: Insufficient documentation

## 2022-06-21 DIAGNOSIS — I340 Nonrheumatic mitral (valve) insufficiency: Secondary | ICD-10-CM | POA: Insufficient documentation

## 2022-06-21 LAB — ECHOCARDIOGRAM LIMITED
Area-P 1/2: 3.16 cm2
Calc EF: 36.9 %
S' Lateral: 4.9 cm
Single Plane A2C EF: 35.9 %
Single Plane A4C EF: 36 %

## 2022-06-26 ENCOUNTER — Telehealth: Payer: Self-pay | Admitting: *Deleted

## 2022-06-26 DIAGNOSIS — I5033 Acute on chronic diastolic (congestive) heart failure: Secondary | ICD-10-CM

## 2022-06-26 DIAGNOSIS — I25118 Atherosclerotic heart disease of native coronary artery with other forms of angina pectoris: Secondary | ICD-10-CM

## 2022-06-26 DIAGNOSIS — I428 Other cardiomyopathies: Secondary | ICD-10-CM

## 2022-06-26 NOTE — Telephone Encounter (Signed)
Reviewed results and recommendations with patient and she verbalized understanding. We discussed medications to continue and the change in carvedilol to 25 mg once daily. Advised that they would like to schedule her for a Cardiac MRI to be done. We briefly discussed this test and will call her back with instructions once we get it scheduled. She verbalized understanding with no other questions at this time.

## 2022-06-26 NOTE — Telephone Encounter (Signed)
Left voicemail message to call back for review of results.  

## 2022-06-26 NOTE — Telephone Encounter (Signed)
-----   Message from Sondra Barges, PA-C sent at 06/22/2022  7:20 AM EDT ----- Please inform the patient her weekend heart persists with pump function reported to be slightly lower than prior.  Heart stiffness is improved, leaky mitral valve is stable, and pressures are normal within the heart suggesting no evidence of volume overload.  Recommendations: -Increase carvedilol to 25 mg twice daily, otherwise continue Jardiance 10 mg daily, Entresto 97/103 mg twice daily, and spironolactone 25 mg daily along with torsemide 20 mg daily as needed -Schedule cardiac MRI to further evaluate her persistent cardiomyopathy -Follow-up as planned later this month

## 2022-06-28 ENCOUNTER — Ambulatory Visit: Payer: Medicare (Managed Care) | Admitting: Physician Assistant

## 2022-07-02 DIAGNOSIS — Z982 Presence of cerebrospinal fluid drainage device: Secondary | ICD-10-CM | POA: Insufficient documentation

## 2022-07-02 DIAGNOSIS — I5020 Unspecified systolic (congestive) heart failure: Secondary | ICD-10-CM | POA: Insufficient documentation

## 2022-07-02 DIAGNOSIS — G4733 Obstructive sleep apnea (adult) (pediatric): Secondary | ICD-10-CM | POA: Insufficient documentation

## 2022-07-02 NOTE — Progress Notes (Unsigned)
Cardiology Office Note    Date:  07/05/2022   ID:  Emily Hinton, DOB July 04, 1961, MRN [id number]  PCP:  Jon Billings, NP  Cardiologist:  Ida Rogue, MD  Electrophysiologist:  None   Chief Complaint: Follow-up  History of Present Illness:   Emily Hinton is a 61 y.o. 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   Emily Hinton was previously followed by cardiology in [place]. She was found to have a cardiomyopathy in 2018 with an EF of 45%. LHC at that time showed nonobstructive coronary arteries.  Echo in 05/2018 demonstrated a persistent cardiomyopathy with an EF of 45%, global hypokinesis, and diastolic dysfunction. This finding was unchanged when compared to echo in 2018.  Subsequent echo in 04/2021 showed an improved LV systolic function with an EF of 55%, probable normal LV wall motion, and diastolic dysfunction. She moved to [facility] from [place] several years ago.    She was admitted to the hospital in 01/2022 with worsening dyspnea and productive cough and found to have multilobar pneumonia with mild volume overload.  She was hypertensive and tachycardic upon presentation with an oxygen saturation of 72% on room air.  High-sensitivity troponin 35 with a delta troponin of 20.  BNP 189.  Chest x-ray showed volume overload versus pneumonia with small pleural effusions.  CT of the chest showed multilobar pneumonia with minimal pleural fluid on the left.  Echo during this admission showed an EF of 40-45%, mildly to moderately dilated LV internal cavity size, global hypokinesis, Gr2DD, normal RV systolic function and ventricular cavity size, and mild mitral  regurgitation.     Coronary CTA on 03/01/2022 showed a calcium score of 0, with no evidence of CAD.  Noncardiac over read was notable for near complete resolution of groundglass opacities when compared to imaging from 01/2022.  There were bandlike changes along the posterior pleural space felt to likely reflect a mixture of atelectasis and postinflammatory changes that were also improving.  It was recommended she follow-up with her PCP for these improving changes.   She was seen in the office on 04/12/2022 and was without symptoms of angina or decompensation.  She had stable 2-3 pillow orthopnea given prior CSF leak.  She remained on Entresto and had been started on lisinopril by outside office.  It was recommended that she discontinue lisinopril.  Amlodipine was also discontinued and Entresto was titrated to 49/51 mg twice daily.  She was otherwise continued on carvedilol, spironolactone, torsemide, and Jardiance.  She was last seen in the office on 05/17/2022 and continued to do well from a cardiac perspective, without symptoms of angina or decompensation.  Her weight was stable by our scale.  Entresto was titrated to 97/103 mg twice daily and carvedilol was titrated to 12.5 mg twice daily.  She was otherwise continued on Jardiance, spironolactone, and torsemide.  Labs showed mild AKI at that time leading torsemide to be transitioned to as needed.  Follow-up limited echo 06/21/2022 showed a persistent cardiomyopathy with an EF of 35 to 40%, global hypokinesis, mildly dilated internal LV cavity size, grade 1 diastolic dysfunction, normal RV systolic function and ventricular cavity size, mild mitral regurgitation, and an estimated  right atrial pressure of 3 mmHg.  She is scheduled for cardiac MRI to further evaluate her cardiomyopathy and LV systolic function on [id number].  She comes in doing well from a cardiac perspective and is without symptoms of angina or decompensation.  Continues to note stable chronic  dyspnea when ambulating up stairs.  Her weight remains stable.  She has stable 2-3 pillow orthopnea given prior CSF leak.  She is watching her salt intake and drinking less than 2 L of fluid per day.  She continues to be under increased stress at home regarding the health of her parents.  She is adherent and tolerating cardiac medications.  No dizziness, presyncope, or syncope.   Labs independently reviewed: 05/2022 - potassium 4.0, BUN 14, serum creatinine 0.93 03/2022 - A1c 8.8, TC 177, TG 142, HDL 50, LDL 98 09/2021 - TSH normal, magnesium 2.0 03/2021 - albumin 4.7, AST/ALT normal  Past Medical History:  Diagnosis Date   CHF (congestive heart failure) (HCC)    CSF leak    Diabetes mellitus without complication (HCC)    DM2 (diabetes mellitus, type 2) ([place])    Essential hypertension    Hyperlipidemia    Morbid obesity (HCC)    NICM (nonischemic cardiomyopathy) ([place])    Nonobstructive atherosclerosis of coronary artery    OSA (obstructive sleep apnea)     Past Surgical History:  Procedure Laterality Date   CSF SHUNT  2017   herniated disc  2017    Current Medications: Current Meds  Medication Sig   acetaminophen (TYLENOL) 500 MG tablet Take 500 mg by mouth every 6 (six) hours as needed.   aspirin 81 MG chewable tablet Chew 81 mg by mouth daily.   carvedilol (COREG) 25 MG tablet Take 1 tablet (25 mg total) by mouth 2 (two) times daily.   empagliflozin (JARDIANCE) 10 MG TABS tablet Take 1 tablet (10 mg) by mouth once daily   gabapentin (NEURONTIN) 600 MG tablet Take 600 mg by mouth 3 (three) times daily.   insulin detemir (LEVEMIR) 100 UNIT/ML injection Inject 40 Units into the skin at bedtime.   insulin regular (NOVOLIN R) 100 units/mL injection Inject 6 Units into the skin in the morning, at noon, and at bedtime.   metFORMIN (GLUCOPHAGE) 1000 MG tablet Take 1,000 mg by mouth 2 (two) times daily with a meal.   omeprazole (PRILOSEC) 20 MG capsule Take 20 mg by mouth daily.    sacubitril-valsartan (ENTRESTO) 97-103 MG Take 1 tablet by mouth 2 (two) times daily.   Semaglutide (OZEMPIC, 0.25 OR 0.5 MG/DOSE, [place]) Inject 0.5 mg into the skin once a week.   spironolactone (ALDACTONE) 25 MG tablet Take 25 mg by mouth daily.   torsemide (DEMADEX) 20 MG tablet Take 1 tablet (20 mg total) by mouth daily as needed (As needed for weight gain greater than 3 pounds overnight or 5 pounds in one week.).   [DISCONTINUED] carvedilol (COREG) 12.5 MG tablet Take 1 tablet (12.5 mg total) by mouth 2 (two) times daily.    Allergies:   Cashew nut oil   Social History   Socioeconomic History   Marital status: Single    Spouse name: Not on file   Number of children: Not on file   Years of education: Not on file   Highest education level: Not on file  Occupational History   Not on file  Tobacco Use   Smoking status: Never   Smokeless tobacco: Never  Vaping Use   Vaping Use: Never used  Substance and Sexual Activity   Alcohol use: Never   Drug use: Never   Sexual activity: Not on file  Other Topics Concern   Not on file  Social History Narrative   Not on file   Social Determinants of Health   Financial Resource Strain: Not on file  Food Insecurity: Not on file  Transportation Needs: Not on file  Physical Activity: Not on file  Stress: Not on file  Social Connections: Not on file     Family History:  The patient's family history includes Heart failure in her father; Hypertension in her father; Kidney disease in her father.  ROS:   12-point review of systems is negative unless otherwise noted in the HPI.   EKGs/Labs/Other Studies Reviewed:    Studies reviewed were summarized above. The additional studies were reviewed today:  2D echo 01/30/2022: 1. Left ventricular ejection fraction, by estimation, is 40 to 45%. The  left ventricle has mildly decreased function. The left ventricle  demonstrates global hypokinesis. The left ventricular internal cavity size  was  mildly to moderately dilated. Left  ventricular diastolic parameters are consistent with Grade II diastolic  dysfunction (pseudonormalization). Elevated left atrial pressure.   2. Right ventricular systolic function is normal. The right ventricular  size is normal. Tricuspid regurgitation signal is inadequate for assessing  PA pressure.   3. The mitral valve is normal in structure. Mild mitral valve  regurgitation. No evidence of mitral stenosis.   4. The aortic valve was not well visualized. Aortic valve regurgitation  is not visualized. No aortic stenosis is present. __________   Coronary CTA 03/01/2022: Noncardiac over read -  1. Near complete resolution of ground-glass opacities in the chest when compared to the recent study from April of 2023. 2. Bandlike changes along the posterior pleural surface likely a mixture of atelectasis and post inflammatory changes also showing improvement since previous imaging. This may reflect developing post inflammatory fibrosis with and is mixed atelectatic changes at the lung bases, suggest attention on follow-up.   Cardiac overread -  Aorta:  Normal size.  No calcifications.  No dissection.   Aortic Valve:  Trileaflet.  No calcifications.   Coronary Arteries:  Normal coronary origin.  Right dominance.   RCA is a dominant artery that gives rise to PDA and PLA. There is no plaque.   Left main gives rise to LAD and LCX arteries. There is no LM disease.   LAD has no plaque.   LCX is a non-dominant artery that gives rise to a large obtuse marginal branch. There is no plaque.   Other findings:   Normal pulmonary vein drainage into the left atrium.   Normal left atrial appendage without a thrombus.   Normal size of the pulmonary artery.   IMPRESSION: 1. Normal coronary calcium score of 0. Patient is low risk for coronary events. 2. Normal coronary origin with right dominance. 3. No evidence of CAD. 4. CAD-RADS 0. Consider  non-atherosclerotic causes of chest pain. __________  Limited echo 06/21/2022: 1. Left ventricular ejection fraction, by estimation, is 35 to 40%. The  left ventricle has moderately decreased function. The left ventricle  demonstrates global hypokinesis. The left ventricular internal cavity size  was mildly dilated. Left ventricular  diastolic parameters are consistent with Grade I diastolic dysfunction  (impaired relaxation).   2. Right ventricular systolic function is normal. The right ventricular  size is normal. Tricuspid regurgitation signal is inadequate for assessing  PA pressure.   3. The mitral  valve is normal in structure. Mild mitral valve  regurgitation. No evidence of mitral stenosis.   4. The aortic valve is normal in structure. Aortic valve regurgitation is  not visualized. No aortic stenosis is present.   5. The inferior vena cava is normal in size with greater than 50%  respiratory variability, suggesting right atrial pressure of 3 mmHg.    EKG:  EKG is not ordered today.    Recent Labs: 01/30/2022: B Natriuretic Peptide 189.7 02/03/2022: Hemoglobin 13.8; Platelets 330 05/31/2022: BUN 14; Creatinine, Ser 0.93; Potassium 4.0; Sodium 137  Recent Lipid Panel No results found for: "CHOL", "TRIG", "HDL", "CHOLHDL", "VLDL", "LDLCALC", "LDLDIRECT"  PHYSICAL EXAM:    VS:  BP 123/83   Pulse 83   Ht 5\' 10"  (1.778 m)   Wt 277 lb 9.6 oz (125.9 kg)   SpO2 98%   BMI 39.83 kg/m   BMI: Body mass index is 39.83 kg/m.  Physical Exam Vitals reviewed.  Constitutional:      Appearance: She is well-developed.  HENT:     Head: Normocephalic and atraumatic.  Eyes:     General:        Right eye: No discharge.        Left eye: No discharge.  Neck:     Vascular: No JVD.  Cardiovascular:     Rate and Rhythm: Normal rate and regular rhythm.     Pulses:          Posterior tibial pulses are 2+ on the right side and 2+ on the left side.     Heart sounds: Normal heart sounds, S1  normal and S2 normal. Heart sounds not distant. No midsystolic click and no opening snap. No murmur heard.    No friction rub.  Pulmonary:     Effort: Pulmonary effort is normal. No respiratory distress.     Breath sounds: Normal breath sounds. No decreased breath sounds, wheezing or rales.  Chest:     Chest wall: No tenderness.  Abdominal:     General: There is no distension.  Musculoskeletal:     Cervical back: Normal range of motion.     Right lower leg: No edema.     Left lower leg: No edema.  Skin:    General: Skin is warm and dry.     Nails: There is no clubbing.  Neurological:     Mental Status: She is alert and oriented to person, place, and time.  Psychiatric:        Speech: Speech normal.        Behavior: Behavior normal.        Thought Content: Thought content normal.        Judgment: Judgment normal.     Wt Readings from Last 3 Encounters:  07/05/22 277 lb 9.6 oz (125.9 kg)  07/03/22 273 lb 8 oz (124.1 kg)  05/17/22 271 lb 6 oz (123.1 kg)     ASSESSMENT & PLAN:   HFrEF secondary to NICM: She appears euvolemic and well compensated with NYHA class II symptoms.  Titrate carvedilol to 25 mg twice daily.  Otherwise, continue current doses of Entresto, Jardiance, spironolactone, and torsemide.  Recent labs showed stable renal function and electrolytes.  Schedule cardiac MRI to further evaluate her cardiomyopathy with further recommendations pending.  Nonobstructive CAD: No symptoms suggestive of angina.  Recent coronary CTA showed a calcium score of 0.  Continue aggressive risk factor modification and primary prevention including aspirin and carvedilol.  Could consider initiation of  statin given underlying diabetes.  HTN: Blood pressure is well controlled in the office today.  Titrate carvedilol with continuation of GDMT as outlined above.  Abnormal chest CT: Noncardiac over read of coronary CTA showed improving inflammatory changes as well as near complete resolution of  groundglass opacities.  Plan for follow-up chest CT after cardiac MRI, if indicated.   Disposition: F/u with Dr. Rockey Situ or an APP after cardiac MRI.   Medication Adjustments/Labs and Tests Ordered: Current medicines are reviewed at length with the patient today.  Concerns regarding medicines are outlined above. Medication changes, Labs and Tests ordered today are summarized above and listed in the Patient Instructions accessible in Encounters.   Signed, Christell Faith, PA-C 07/05/2022 3:09 PM     [address], [place] [address] [phone]

## 2022-07-02 NOTE — Progress Notes (Unsigned)
   There were no vitals taken for this visit.   Subjective:    Patient ID: Emily Hinton, female    DOB: [birth year], 61 y.o.   MRN: [id number]  HPI: Emily Hinton is a 61 y.o. female  No chief complaint on file.  Patient presents to clinic to establish care with new PCP.  Introduced to Designer, jewellery role and practice setting.  All questions answered.  Discussed provider/patient relationship and expectations.  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  Patient denies a history of: Hypertension, Elevated Cholesterol, Diabetes, Thyroid problems, Depression, Anxiety, Neurological problems, and Abdominal problems.   Active Ambulatory Problems    Diagnosis Date Noted   CAP (community acquired pneumonia) 01/30/2022   Acute respiratory failure ([place]) 01/30/2022   Acute on chronic diastolic CHF (congestive heart failure) ([place]) 01/30/2022   Diabetes mellitus ([place]) 01/30/2022   Essential hypertension 01/30/2022   Morbid obesity ([place]) 07/02/2022   HFrEF (heart failure with reduced ejection fraction) ([place]) 07/02/2022   S/P VP shunt 07/02/2022   OSA (obstructive sleep apnea) 07/02/2022   Resolved Ambulatory Problems    Diagnosis Date Noted   No Resolved Ambulatory Problems   Past Medical History:  Diagnosis Date   CHF (congestive heart failure) (HCC)    CSF leak    Diabetes mellitus without complication (HCC)    DM2 (diabetes mellitus, type 2) (HCC)    Hyperlipidemia    NICM (nonischemic cardiomyopathy) (HCC)    Nonobstructive atherosclerosis of coronary artery    Past Surgical History:  Procedure Laterality Date   CSF  SHUNT  2017   herniated disc  2017   Family History  Problem Relation Age of Onset   Heart failure Father    Hypertension Father    Kidney disease Father      Review of Systems  Per HPI unless specifically indicated above     Objective:    There were no vitals taken for this visit.  Wt Readings from Last 3 Encounters:  05/17/22 271 lb 6 oz (123.1 kg)  04/12/22 271 lb 8 oz (123.2 kg)  02/22/22 273 lb 2 oz (123.9 kg)    Physical Exam  Results for orders placed or performed in visit on 06/21/22  ECHOCARDIOGRAM LIMITED  Result Value Ref Range   S' Lateral 4.90 cm   Area-P 1/2 3.16 cm2   Single Plane A4C EF 36.0 %   Single Plane A2C EF 35.9 %   Calc EF 36.9 %      Assessment & Plan:   Problem List Items Addressed This Visit       Cardiovascular and Mediastinum   Essential hypertension   HFrEF (heart failure with reduced ejection fraction) (HCC)     Respiratory   OSA (obstructive sleep apnea)     Endocrine   Diabetes mellitus ([place]) - Primary     Other   Morbid obesity ([place])   S/P VP shunt     Follow up plan: No follow-ups on file.

## 2022-07-03 ENCOUNTER — Ambulatory Visit (INDEPENDENT_AMBULATORY_CARE_PROVIDER_SITE_OTHER): Payer: Medicare (Managed Care) | Admitting: Nurse Practitioner

## 2022-07-03 ENCOUNTER — Encounter: Payer: Self-pay | Admitting: Nurse Practitioner

## 2022-07-03 ENCOUNTER — Telehealth: Payer: Self-pay | Admitting: Family

## 2022-07-03 VITALS — BP 120/70 | HR 85 | Temp 98.0°F | Ht 70.08 in | Wt 273.5 lb

## 2022-07-03 DIAGNOSIS — E1122 Type 2 diabetes mellitus with diabetic chronic kidney disease: Secondary | ICD-10-CM

## 2022-07-03 DIAGNOSIS — I5020 Unspecified systolic (congestive) heart failure: Secondary | ICD-10-CM

## 2022-07-03 DIAGNOSIS — G4733 Obstructive sleep apnea (adult) (pediatric): Secondary | ICD-10-CM

## 2022-07-03 DIAGNOSIS — Z981 Arthrodesis status: Secondary | ICD-10-CM

## 2022-07-03 DIAGNOSIS — I10 Essential (primary) hypertension: Secondary | ICD-10-CM

## 2022-07-03 DIAGNOSIS — E1169 Type 2 diabetes mellitus with other specified complication: Secondary | ICD-10-CM | POA: Diagnosis not present

## 2022-07-03 DIAGNOSIS — N1831 Chronic kidney disease, stage 3a: Secondary | ICD-10-CM

## 2022-07-03 DIAGNOSIS — Z982 Presence of cerebrospinal fluid drainage device: Secondary | ICD-10-CM

## 2022-07-03 DIAGNOSIS — Z1231 Encounter for screening mammogram for malignant neoplasm of breast: Secondary | ICD-10-CM

## 2022-07-03 DIAGNOSIS — Z794 Long term (current) use of insulin: Secondary | ICD-10-CM

## 2022-07-03 DIAGNOSIS — Z7689 Persons encountering health services in other specified circumstances: Secondary | ICD-10-CM

## 2022-07-03 NOTE — Telephone Encounter (Signed)
LVM in attemt to schedule patient with a follow up appointment after referral was placed from patients PCP.   Lugene Beougher, NT

## 2022-07-03 NOTE — Assessment & Plan Note (Signed)
Recommend a healthy lifestyle through diet and exercise.  °

## 2022-07-03 NOTE — Assessment & Plan Note (Signed)
Chronic. Does not use CPAP regularly.  Referral placed for patient to see Pulmonology for further evaluation and treatment of OSA.

## 2022-07-03 NOTE — Assessment & Plan Note (Signed)
Has not seen Neurosurgery is several years.  Discussed establishing care with neurosurgeon in [place].  Referral placed for patient today.

## 2022-07-03 NOTE — Assessment & Plan Note (Signed)
Has not seen Neurosurgery is several years.  Discussed establishing care with neurosurgeon in [place].  Referral placed for patient today.   

## 2022-07-03 NOTE — Assessment & Plan Note (Signed)
Chronic.  Controlled.  Continue with current medication regimen.  Is followed by Cardiology.  Referral placed for patient to see HF clinic.  Has Cardiac MRI scheduled for next week.  Return to clinic in 1 months for reevaluation.  Call sooner if concerns arise.

## 2022-07-03 NOTE — Assessment & Plan Note (Addendum)
Chronic. Not well controlled.  A1c checked on 9/14 showed 9.8.  On insulin Levemir 40u and Novolin 6u, Jardiance, ozempic 0.5mg , and Metformin.  States she has increased to Ozempic 1mg  before and had difficulty with side effects.  Per patient, insulin was recently adjusted by previous PCP. States sugars are running 100-120.  Will follow up in 1 month for reevaluation.  Call [facility] if concerns arise. Patient was seeing Nephrology in [place].  Referral placed to establish care in this area.

## 2022-07-03 NOTE — Assessment & Plan Note (Signed)
Chronic.  Controlled.  Continue with current medication regimen of Carvedilol and Valsartan (Entresto).  Labs reviewed from last week.  Return to clinic in 1 month for reevaluation.  Call sooner if concerns arise.

## 2022-07-05 ENCOUNTER — Encounter: Payer: Self-pay | Admitting: Physician Assistant

## 2022-07-05 ENCOUNTER — Ambulatory Visit: Payer: Medicare (Managed Care) | Attending: Physician Assistant | Admitting: Physician Assistant

## 2022-07-05 VITALS — BP 123/83 | HR 83 | Ht 70.0 in | Wt 277.6 lb

## 2022-07-05 DIAGNOSIS — I428 Other cardiomyopathies: Secondary | ICD-10-CM | POA: Diagnosis not present

## 2022-07-05 DIAGNOSIS — R918 Other nonspecific abnormal finding of lung field: Secondary | ICD-10-CM

## 2022-07-05 DIAGNOSIS — I5022 Chronic systolic (congestive) heart failure: Secondary | ICD-10-CM | POA: Diagnosis not present

## 2022-07-05 DIAGNOSIS — I10 Essential (primary) hypertension: Secondary | ICD-10-CM

## 2022-07-05 DIAGNOSIS — I2510 Atherosclerotic heart disease of native coronary artery without angina pectoris: Secondary | ICD-10-CM | POA: Diagnosis not present

## 2022-07-05 MED ORDER — CARVEDILOL 25 MG PO TABS: 25.0000 mg | ORAL_TABLET | Freq: Two times a day (BID) | ORAL | 3 refills | Status: DC

## 2022-07-05 NOTE — Patient Instructions (Signed)
Medication Instructions:  Your physician has recommended you make the following change in your medication:   INCREASE Carvedilol to 25 mg twice a day  *If you need a refill on your cardiac medications before your next appointment, please call your pharmacy*   Lab Work: None  If you have labs (blood work) drawn today and your tests are completely normal, you will receive your results only by: [place] (if you have MyChart) OR A paper copy in the mail If you have any lab test that is abnormal or we need to change your treatment, we will call you to review the results.   Testing/Procedures: None   Follow-Up: At [facility], you and your health needs are our priority.  As part of our continuing mission to provide you with exceptional heart care, we have created designated Provider Care Teams.  These Care Teams include your primary Cardiologist (physician) and Advanced Practice Providers (APPs -  Physician Assistants and Nurse Practitioners) who all work together to provide you with the care you need, when you need it.  We recommend signing up for the patient portal called "MyChart".  Sign up information is provided on this After Visit Summary.  MyChart is used to connect with patients for Virtual Visits (Telemedicine).  Patients are able to view lab/test results, encounter notes, upcoming appointments, etc.  Non-urgent messages can be sent to your provider as well.   To learn more about what you can do with MyChart, go to [email or web address].    Your next appointment:   6 week(s) after test  The format for your next appointment:   In Person  Provider:   Ida Rogue, MD or Christell Faith, PA-C        Important Information About Sugar

## 2022-07-09 ENCOUNTER — Other Ambulatory Visit: Payer: Self-pay

## 2022-07-11 ENCOUNTER — Telehealth: Payer: Self-pay | Admitting: *Deleted

## 2022-07-11 DIAGNOSIS — Z982 Presence of cerebrospinal fluid drainage device: Secondary | ICD-10-CM

## 2022-07-11 NOTE — Telephone Encounter (Signed)
Left detailed voicemail message to call back so we can get Xray scheduled before her other testing.

## 2022-07-11 NOTE — Telephone Encounter (Signed)
-----   Message from Emily Hinton, [place] sent at 07/11/2022 12:54 PM EDT ----- Please schedule the patient for an x-ray of her skull to evaluate her VP shunt. If possible, can we get this completed prior to her scheduled cardiac MRI on 07/18/2022? Thanks, [employer]

## 2022-07-12 NOTE — Telephone Encounter (Signed)
Reviewed request by provider to have xray prior to her MRI. She was agreeable with that plan. Patient has appointment tomorrow at the heart failure clinic and will go after that to the [facility] to have that done. She verbalized understanding with no further questions at this time.

## 2022-07-12 NOTE — Telephone Encounter (Signed)
Left voicemail message to call back so that we can arrange xray before her upcoming procedure.

## 2022-07-13 ENCOUNTER — Ambulatory Visit
Admission: RE | Admit: 2022-07-13 | Discharge: 2022-07-13 | Disposition: A | Discharge status: Home / Self Care | Payer: Medicare (Managed Care) | Source: Ambulatory Visit | Attending: Physician Assistant | Admitting: Physician Assistant

## 2022-07-13 ENCOUNTER — Encounter: Payer: Self-pay | Admitting: Family

## 2022-07-13 ENCOUNTER — Ambulatory Visit (HOSPITAL_BASED_OUTPATIENT_CLINIC_OR_DEPARTMENT_OTHER): Payer: Medicare (Managed Care) | Admitting: Family

## 2022-07-13 ENCOUNTER — Other Ambulatory Visit: Payer: Self-pay | Admitting: Physician Assistant

## 2022-07-13 VITALS — BP 122/70 | HR 89 | Resp 20 | Ht 71.0 in | Wt 274.0 lb

## 2022-07-13 DIAGNOSIS — N182 Chronic kidney disease, stage 2 (mild): Secondary | ICD-10-CM

## 2022-07-13 DIAGNOSIS — I5022 Chronic systolic (congestive) heart failure: Secondary | ICD-10-CM | POA: Insufficient documentation

## 2022-07-13 DIAGNOSIS — Z794 Long term (current) use of insulin: Secondary | ICD-10-CM | POA: Insufficient documentation

## 2022-07-13 DIAGNOSIS — E1122 Type 2 diabetes mellitus with diabetic chronic kidney disease: Secondary | ICD-10-CM | POA: Diagnosis not present

## 2022-07-13 DIAGNOSIS — I10 Essential (primary) hypertension: Secondary | ICD-10-CM

## 2022-07-13 DIAGNOSIS — G4733 Obstructive sleep apnea (adult) (pediatric): Secondary | ICD-10-CM | POA: Diagnosis not present

## 2022-07-13 DIAGNOSIS — Z982 Presence of cerebrospinal fluid drainage device: Secondary | ICD-10-CM

## 2022-07-13 NOTE — Patient Instructions (Signed)
Continue weighing daily and call for an overnight weight gain of 3 pounds or more or a weekly weight gain of more than 5 pounds.   If you have voicemail, please make sure your mailbox is cleaned out so that we may leave a message and please make sure to listen to any voicemails.     

## 2022-07-13 NOTE — Progress Notes (Signed)
Patient ID: Emily Hinton, female    DOB: 1961/02/01, 61 y.o.   MRN: [id number]  HPI  Emily Hinton is a 61 y/o female with a history of CAD, DM, CSF shunt, hyperlipidemia, HTN, obesity, obstructive sleep apnea and chronic heart failure.   Echo report from 06/21/22 reviewed and showed an EF of 35-40% with mild MR. Echo report from 01/30/22 reviewed and showed an EF of 40-45% along with mild MR.   Admitted 01/30/22 due to worsening shortness of breath and a cough productive of sputum streaked with blood. Cardiology consult obtained. Initially placed on bipap due to hypoxia. Initially given IV lasix with transition to oral diuretics. Antibiotics and bronchodilators given for CAP. Able to be weaned off of oxygen. Discharged after 4 days.   She presents today for a follow-up visit with a chief complaint of moderate fatigue upon minimal exertion. Describes this as chronic in nature. She has associated decreased appetite, occasional chest tightness, cough, shortness of breath, minimal left leg edema, anxiety and difficulty sleeping along with this. She denies any dizziness, abdominal distention, palpitations or chest pain.  Not weighing daily but does have working scales.   She says that she isn't sleeping well at night and forgot to set her alarm this morning and so she woke up suddenly and was afraid she was going to be late so rushed getting here. She also says that she's under quite a bit of stress as she takes care of her dad here and goes to [place] every week on a bus to take care of her mom. This week she decided she was taking a break and use this week to take care of herself.   Having a cardiac MRI later next week.   Past Medical History:  Diagnosis Date   CHF (congestive heart failure) (HCC)    CSF leak    Diabetes mellitus without complication (HCC)    DM2 (diabetes mellitus, type 2) (HCC)    Essential hypertension    Hyperlipidemia    Morbid obesity (HCC)    NICM (nonischemic  cardiomyopathy) ([place])    Nonobstructive atherosclerosis of coronary artery    OSA (obstructive sleep apnea)    Past Surgical History:  Procedure Laterality Date   CSF SHUNT  2017   herniated disc  2017   Family History  Problem Relation Age of Onset   Heart failure Father    Hypertension Father    Kidney disease Father    Social History   Tobacco Use   Smoking status: Never   Smokeless tobacco: Never  Substance Use Topics   Alcohol use: Never   Allergies  Allergen Reactions   Cashew Nut Oil Hives, Itching and Rash   Prior to Admission medications   Medication Sig Start Date End Date Taking? Authorizing Provider  acetaminophen (TYLENOL) 500 MG tablet Take 500 mg by mouth every 6 (six) hours as needed.   Yes [provider]  aspirin 81 MG chewable tablet Chew 81 mg by mouth daily. 09/15/21  Yes [provider]  carvedilol (COREG) 25 MG tablet Take 1 tablet (25 mg total) by mouth 2 (two) times daily. 07/05/22 10/03/22 Yes Dunn, Areta Haber, PA-C  empagliflozin (JARDIANCE) 10 MG TABS tablet Take 1 tablet (10 mg) by mouth once daily   Yes [provider]  gabapentin (NEURONTIN) 600 MG tablet Take 600 mg by mouth 3 (three) times daily.   Yes [provider]  insulin detemir (LEVEMIR) 100 UNIT/ML injection Inject 40 Units  into the skin at bedtime.   Yes [provider]  insulin regular (NOVOLIN R) 100 units/mL injection Inject 6 Units into the skin in the morning, at noon, and at bedtime. 04/04/22 10/01/22 Yes [provider]  metFORMIN (GLUCOPHAGE) 1000 MG tablet Take 1,000 mg by mouth 2 (two) times daily with a meal.   Yes [provider]  omeprazole (PRILOSEC) 20 MG capsule Take 20 mg by mouth daily.   Yes [provider]  sacubitril-valsartan (ENTRESTO) 97-103 MG Take 1 tablet by mouth 2 (two) times daily. 05/17/22  Yes Dunn, Ryan M, PA-C  Semaglutide (OZEMPIC, 0.25 OR 0.5 MG/DOSE, [place]) Inject 0.5 mg into the skin once a  week.   Yes [provider]  spironolactone (ALDACTONE) 25 MG tablet Take 25 mg by mouth daily.   Yes [provider]  torsemide (DEMADEX) 20 MG tablet Take 1 tablet (20 mg total) by mouth daily as needed (As needed for weight gain greater than 3 pounds overnight or 5 pounds in one week.). 05/25/22  Yes Dunn, Areta Haber, PA-C    Review of Systems  Constitutional:  Positive for appetite change (decreased) and fatigue (easily).  HENT:  Negative for congestion, postnasal drip and sore throat.   Eyes: Negative.   Respiratory:  Positive for cough, chest tightness (couple of days ago) and shortness of breath (with moderate exertion).   Cardiovascular:  Positive for leg swelling (sometimes in left). Negative for chest pain and palpitations.  Gastrointestinal:  Negative for abdominal distention and abdominal pain.  Endocrine: Negative.   Genitourinary: Negative.   Musculoskeletal:  Positive for back pain (due to being in bed during admission).  Skin: Negative.   Allergic/Immunologic: Negative.   Neurological:  Negative for dizziness and light-headedness.  Hematological:  Negative for adenopathy. Does not bruise/bleed easily.  Psychiatric/Behavioral:  Positive for sleep disturbance (chronic sleeping; sleeping on 3 pillows). Negative for dysphoric mood. The patient is nervous/anxious.    Vitals:   07/13/22 1012 07/13/22 1034  BP: (!) 145/109 122/70  Pulse: 89   Resp: 20   SpO2: 100%   Weight: 274 lb (124.3 kg)   Height: 5\' 11"  (1.803 m)    Wt Readings from Last 3 Encounters:  07/13/22 274 lb (124.3 kg)  07/05/22 277 lb 9.6 oz (125.9 kg)  07/03/22 273 lb 8 oz (124.1 kg)   Lab Results  Component Value Date   CREATININE 0.93 05/31/2022   CREATININE 1.36 (H) 05/17/2022   CREATININE 1.07 (H) 02/03/2022   Physical Exam Vitals and nursing note reviewed.  Constitutional:      Appearance: Normal appearance.  HENT:     Head: Normocephalic and atraumatic.  Cardiovascular:      Rate and Rhythm: Normal rate and regular rhythm.  Pulmonary:     Effort: Pulmonary effort is normal. No respiratory distress.     Breath sounds: No wheezing or rales.  Abdominal:     General: There is no distension.     Palpations: Abdomen is soft.     Tenderness: There is no abdominal tenderness.  Musculoskeletal:        General: No tenderness.     Cervical back: Normal range of motion and neck supple.     Right lower leg: No tenderness. No edema.     Left lower leg: No tenderness. Edema (trace pitting) present.  Skin:    General: Skin is warm and dry.  Neurological:     General: No focal deficit present.  Mental Status: She is alert and oriented to person, place, and time.  Psychiatric:        Mood and Affect: Mood normal.        Behavior: Behavior normal.        Thought Content: Thought content normal.    Assessment & Plan:  1: Chronic heart failure with reduced ejection fraction- - NYHA class III - euvolemic today - not weighing daily encouraged to resume so that she can call for an overnight weight gain of > 2 pounds or a weekly weight gain of > 5 pounds - weight up 5 pounds from last visit here 5 months ago - is trying to decrease her sodium usage and says that it's hard but that she's trying; reviewed the importance of not adding any salt to her food and to read food labels for sodium content so that she can keep daily sodium intake to ~ 2000mg  - saw cardiology (Pohrebenko-Holoborodko) in [place] on 09/15/21 - saw cardiology (Dunn) 07/05/22 - on GDMT of carvedilol, jardiance, entresto and spironolactone - BMP 01/30/22 was 189.7 - having a cardiac MRI next week  2: HTN-  - BP initially elevated (145/109) after waking up late and rushing here; rechecked with a manual cuff after sitting was much improved (122/70) - encouraged her to take care of herself  - saw PCP Mathis Dad) 07/03/22 - BMP 05/31/22 reviewed and showed sodium 137, potassium 4.0, creatinine 0.93 & GFR >60  3:  DM- - A1c 01/30/22 was 8.7% - fasting glucose at home this morning was 126  4: Sleep apnea- - was using CPAP some but not enough so the company came and picked up the machine - had e-consutl with pulmonology 06/28/22   Medication list reviewed.   Return in 6 months, sooner if needed.

## 2022-07-17 ENCOUNTER — Telehealth (HOSPITAL_COMMUNITY): Payer: Self-pay | Admitting: *Deleted

## 2022-07-17 NOTE — Telephone Encounter (Signed)
Reaching out to patient to offer assistance regarding upcoming cardiac imaging study; pt verbalizes understanding of appt date/time, parking situation and where to check in, and verified current allergies; name and call back number provided for further questions should they arise  Zeb Rawl RN Navigator Cardiac Imaging [facility] [phone] office [phone] cell  Patient denies claustrophobia or metal. 

## 2022-07-18 ENCOUNTER — Ambulatory Visit
Admission: RE | Admit: 2022-07-18 | Discharge: 2022-07-18 | Disposition: A | Discharge status: Home / Self Care | Payer: Medicare (Managed Care) | Source: Ambulatory Visit | Attending: Physician Assistant | Admitting: Physician Assistant

## 2022-07-18 ENCOUNTER — Other Ambulatory Visit: Payer: Self-pay | Admitting: Physician Assistant

## 2022-07-18 DIAGNOSIS — I428 Other cardiomyopathies: Secondary | ICD-10-CM | POA: Insufficient documentation

## 2022-07-18 DIAGNOSIS — I5033 Acute on chronic diastolic (congestive) heart failure: Secondary | ICD-10-CM | POA: Diagnosis not present

## 2022-07-18 DIAGNOSIS — I25118 Atherosclerotic heart disease of native coronary artery with other forms of angina pectoris: Secondary | ICD-10-CM

## 2022-07-18 MED ORDER — GADOBUTROL 1 MMOL/ML IV SOLN
15.0000 mL | Freq: Once | INTRAVENOUS | Status: AC | PRN
  Administered 2022-07-18: 15 mL via INTRAVENOUS

## 2022-07-24 ENCOUNTER — Telehealth: Payer: Self-pay | Admitting: Orthopedic Surgery

## 2022-07-24 DIAGNOSIS — M542 Cervicalgia: Secondary | ICD-10-CM

## 2022-07-24 DIAGNOSIS — Z982 Presence of cerebrospinal fluid drainage device: Secondary | ICD-10-CM

## 2022-07-24 DIAGNOSIS — M545 Low back pain, unspecified: Secondary | ICD-10-CM

## 2022-07-24 DIAGNOSIS — Z981 Arthrodesis status: Secondary | ICD-10-CM

## 2022-07-24 NOTE — Telephone Encounter (Signed)
Okay. If she wants to see me first prior to getting xrays then that is okay too. We can always discuss further at her visit.

## 2022-07-24 NOTE — Progress Notes (Signed)
Referring Physician:  Jon Billings, NP [address] [place],  [place] [address]  Primary Physician:  Jon Billings, NP  History of Present Illness: 07/24/2022 [name] has a history of CAD, DM, CSF shunt, hyperlipidemia, HTN, OSA, MI, and chronic heart failure.  She had cranial surgery for CSF leak in [place] in 2014. She had some left sided weakness and speaking difficulty after her first surgery. This improved after 2 years. She had another surgery in 2017 and then had shunt done. She also had posterior cervical fusion in September of 2017.   She is diabetic. Last HgbA1c on 06/28/22 was 9.8.   She has relocated here from [place].   She has constant posterior neck pain with some radiation to her shoulders. No arm pain. She has numbness and tingling in right ring/small finger. Has occasional weakness in her hands. She still has some minimal weakness in left leg. Last time she did PT, it made her pain worse.   No headaches, blurry vision, or dizziness.   Conservative measures:  Physical therapy: none recent  Multimodal medical therapy including regular antiinflammatories: tylenol, neurontin Injections: No recent epidural steroid injections  Past Surgery: multiple, see above.  Davey Bergsma has no symptoms of cervical myelopathy.  Review of Systems:  A 10 point review of systems is negative, except for the pertinent positives and negatives detailed in the HPI.  Past Medical History: Past Medical History:  Diagnosis Date   CHF (congestive heart failure) ([place])    CSF leak    Diabetes mellitus without complication (HCC)    DM2 (diabetes mellitus, type 2) (HCC)    Essential hypertension    Hyperlipidemia    Morbid obesity ([place])    NICM (nonischemic cardiomyopathy) ([place])    Nonobstructive atherosclerosis of coronary artery    OSA (obstructive sleep apnea)     Past Surgical History: Past Surgical History:  Procedure Laterality Date   CSF  SHUNT  2017   herniated disc  2017    Allergies: Allergies as of 07/26/2022 - Review Complete 07/13/2022  Allergen Reaction Noted   Cashew nut oil Hives, Itching, and Rash 01/23/2018    Medications: Outpatient Encounter Medications as of 07/26/2022  Medication Sig   acetaminophen (TYLENOL) 500 MG tablet Take 500 mg by mouth every 6 (six) hours as needed.   aspirin 81 MG chewable tablet Chew 81 mg by mouth daily.   carvedilol (COREG) 25 MG tablet Take 1 tablet (25 mg total) by mouth 2 (two) times daily.   empagliflozin (JARDIANCE) 10 MG TABS tablet Take 1 tablet (10 mg) by mouth once daily   gabapentin (NEURONTIN) 600 MG tablet Take 600 mg by mouth 3 (three) times daily.   insulin detemir (LEVEMIR) 100 UNIT/ML injection Inject 40 Units into the skin at bedtime.   insulin regular (NOVOLIN R) 100 units/mL injection Inject 6 Units into the skin in the morning, at noon, and at bedtime.   metFORMIN (GLUCOPHAGE) 1000 MG tablet Take 1,000 mg by mouth 2 (two) times daily with a meal.   omeprazole (PRILOSEC) 20 MG capsule Take 20 mg by mouth daily.   sacubitril-valsartan (ENTRESTO) 97-103 MG Take 1 tablet by mouth 2 (two) times daily.   Semaglutide (OZEMPIC, 0.25 OR 0.5 MG/DOSE, [place]) Inject 0.5 mg into the skin once a week.   spironolactone (ALDACTONE) 25 MG tablet Take 25 mg by mouth daily.   torsemide (DEMADEX) 20 MG tablet Take 1 tablet (20 mg total) by mouth daily as  needed (As needed for weight gain greater than 3 pounds overnight or 5 pounds in one week.).   No facility-administered encounter medications on file as of 07/26/2022.    Social History: Social History   Tobacco Use   Smoking status: Never   Smokeless tobacco: Never  Vaping Use   Vaping Use: Never used  Substance Use Topics   Alcohol use: Never   Drug use: Never    Family Medical History: Family History  Problem Relation Age of Onset   Heart failure Father    Hypertension Father    Kidney disease Father      Physical Examination: There were no vitals filed for this visit.  General: Patient is well developed, well nourished, calm, collected, and in no apparent distress. Attention to examination is appropriate.  Respiratory: Patient is breathing without any difficulty.   NEUROLOGICAL:     Awake, alert, oriented to person, place, and time.  Speech is clear and fluent. Fund of knowledge is appropriate.   Cranial Nerves: Pupils equal round and reactive to light.  Facial tone is symmetric.  Facial sensation is symmetric. Tongue is midline.   Shunt valve area shows no erythema or gross swelling. Minimal tenderness.   ROM of spine:  Limited ROM of cervical spine with pain  Well healed posterior cervical incision with mild tenderness.   No abnormal lesions on exposed skin.   Strength: Side Biceps Triceps Deltoid Interossei Grip Wrist Ext. Wrist Flex.  R 5 5 5 5 5 5 5   L 5 5 5 5 5 5 5    Side Iliopsoas Quads Hamstring PF DF EHL  R 5 5 5 5 5 5   L 5 5 5 5 5 5    Reflexes are 2+ and symmetric at the biceps, triceps, brachioradialis, patella and achilles.   Hoffman's is absent.  Clonus is not present.   Bilateral upper and lower extremity sensation is intact to light touch.     Gait is normal.    She has limited ROM of left shoulder with pain. Pain with IR/ER. Pain with stress of rotator cuff.    Medical Decision Making  Imaging: Cervical spine xrays 07/26/22:  Instrumented posterior spinal fusion C3-C7. Slight slip at C2-C3 on flexion.   Shunt series xrays 07/26/22:  Shunt intact.   Above xrays reviewed with Dr. . Radiology report not available.   Assessment and Plan: Ms. is a pleasant 61 y.o. female is here to establish care. She recently moved from 09/25/22.   She has complicated surgical history. See above.   She has chronic constant posterior neck pain with some radiation to her shoulders.   Posterior cervical fusion C3-C7 looks good. Shunt series  xrays look good. Neck pain is likely cervical mediated.   Left shoulder pain appears to be shoulder mediated- she has limited painful ROM of the shoulder.   Treatment options discussed with patient and following plan made:   - Referral to ortho at [facility] for evaluation of left shoulder pain.  - Hold on further treatment for cervical spine. Discussed PT, but it made her worse previously.  - She has established care and will f/u prn.   I spent a total of 30 minutes in face-to-face and non-face-to-face activities related to this patient's care today.  Thank you for involving me in the care of this patient.   [name] PA-C Dept. of Neurosurgery

## 2022-07-24 NOTE — Telephone Encounter (Signed)
I called patient and notified of you wanting xrays before her appt. She said she has had a lot of imaging done lately and she will try to get them if not she will just cancel her appt. Yesterday she called stating that she did not know why she was referred and she did not need this appt but was going to keep it since it was already scheduled.

## 2022-07-24 NOTE — Telephone Encounter (Signed)
She has appt with me Thursday and has a history of spinal fusion.   I have ordered lumbar xrays. Please have her get these prior to seeing me.   Thanks!

## 2022-07-26 ENCOUNTER — Ambulatory Visit
Admission: RE | Admit: 2022-07-26 | Discharge: 2022-07-26 | Disposition: A | Discharge status: Home / Self Care | Payer: Medicare (Managed Care) | Attending: Orthopedic Surgery | Admitting: Orthopedic Surgery

## 2022-07-26 ENCOUNTER — Ambulatory Visit (INDEPENDENT_AMBULATORY_CARE_PROVIDER_SITE_OTHER): Payer: Medicare (Managed Care) | Admitting: Orthopedic Surgery

## 2022-07-26 ENCOUNTER — Encounter: Payer: Self-pay | Admitting: Orthopedic Surgery

## 2022-07-26 ENCOUNTER — Ambulatory Visit
Admission: RE | Admit: 2022-07-26 | Discharge: 2022-07-26 | Disposition: A | Discharge status: Home / Self Care | Payer: Medicare (Managed Care) | Source: Ambulatory Visit | Attending: Orthopedic Surgery | Admitting: Orthopedic Surgery

## 2022-07-26 VITALS — BP 113/74 | HR 93 | Ht 71.0 in | Wt 272.6 lb

## 2022-07-26 DIAGNOSIS — M542 Cervicalgia: Secondary | ICD-10-CM | POA: Insufficient documentation

## 2022-07-26 DIAGNOSIS — Z982 Presence of cerebrospinal fluid drainage device: Secondary | ICD-10-CM | POA: Diagnosis present

## 2022-07-26 DIAGNOSIS — M545 Low back pain, unspecified: Secondary | ICD-10-CM | POA: Insufficient documentation

## 2022-07-26 DIAGNOSIS — M25512 Pain in left shoulder: Secondary | ICD-10-CM

## 2022-07-26 DIAGNOSIS — Z981 Arthrodesis status: Secondary | ICD-10-CM | POA: Insufficient documentation

## 2022-07-26 DIAGNOSIS — Z9289 Personal history of other medical treatment: Secondary | ICD-10-CM

## 2022-07-26 NOTE — Addendum Note (Signed)
Addended by: Berdine Addison on: 07/26/2022 09:17 AM   Modules accepted: Orders

## 2022-07-26 NOTE — Telephone Encounter (Signed)
I spoke with Emily Hinton at xray and the patient. She is not being seen for her back. She wants to be seen for her neck and her shunt. Xray orders for lumbar were canceled. Xray orders placed for cervical and shunt series per Marzetta Board and Dr Izora Ribas.

## 2022-08-01 NOTE — Progress Notes (Deleted)
There were no vitals taken for this visit.   Subjective:    Patient ID: Emily Hinton, female    DOB: 26-May-1961, 61 y.o.   MRN: [id number]  HPI: Emily Hinton is a 61 y.o. female presenting on 08/02/2022 for comprehensive medical examination. Current medical complaints include:{Blank single:19197::"none","***"}  She currently lives with: Menopausal Symptoms: {Blank single:19197::"yes","no"}  DIABETES Hypoglycemic episodes:no Polydipsia/polyuria: no Visual disturbance: no Chest pain: no Paresthesias: yes Glucose Monitoring: yes             Accucheck frequency: TID             Fasting glucose: 90-120             Post prandial:             Evening:             Before meals: Taking Insulin?: yes             Long acting insulin: Levemir 40 u             Short acting insulin: Novolin 6u TID Blood Pressure Monitoring: daily Retinal Examination: Up to Date Foot Exam: Up to Date Diabetic Education: Not Completed Pneumovax: Up to Date Influenza: Not up to Date Aspirin: no   HYPERTENSION with Chronic Kidney Disease Hypertension status: controlled  Satisfied with current treatment? yes Duration of hypertension: chronic BP monitoring frequency:  daily BP range: 100-120/70 BP medication side effects:  no Medication compliance: excellent compliance Previous BP meds:carvedilol, spironalactone, and valsartan Aspirin: no Recurrent headaches: no Visual changes: no Palpitations: no Dyspnea: SOB Chest pain: no Lower extremity edema: no Dizzy/lightheaded: no  Functional Status Survey:       07/13/2022   10:12 AM 07/03/2022   11:06 AM 02/13/2022    1:20 PM  Fall Risk   Falls in the past year? 0 0 0  Number falls in past yr: 0 0 0  Injury with Fall? 0 0 0  Risk for fall due to :  No Fall Risks Impaired balance/gait  Follow up Falls evaluation completed Falls evaluation completed Falls evaluation completed    Depression Screen    07/03/2022    11:07 AM 02/13/2022    1:21 PM  Depression screen PHQ 2/9  Decreased Interest 2 0  Down, Depressed, Hopeless 1 0  PHQ - 2 Score 3 0  Altered sleeping 3   Tired, decreased energy 3   Change in appetite 3   Feeling bad or failure about yourself  0   Trouble concentrating 3   Moving slowly or fidgety/restless 3   Suicidal thoughts 0   PHQ-9 Score 18   Difficult doing work/chores Not difficult at all      Advanced Directives Does patient have a HCPOA?    {Blank single:19197::"yes","no"} If yes, name and contact information:  Does patient have a living will or MOST form?  {Blank single:19197::"yes","no"}  Past Medical History:  Past Medical History:  Diagnosis Date   CHF (congestive heart failure) (HCC)    CSF leak    Diabetes mellitus without complication (HCC)    DM2 (diabetes mellitus, type 2) ([place])    Essential hypertension    Heart attack ([place])    Hyperlipidemia    Morbid obesity (HCC)    NICM (nonischemic cardiomyopathy) ([place])    Nonobstructive atherosclerosis of coronary artery    OSA (obstructive sleep apnea)     Surgical History:  Past Surgical History:  Procedure Laterality Date   CERVICAL  FUSION  2017   CSF leak repair x 2     2014 and 2017   CSF SHUNT  2017    Medications:  Current Outpatient Medications on File Prior to Visit  Medication Sig   acetaminophen (TYLENOL) 500 MG tablet Take 500 mg by mouth every 6 (six) hours as needed.   aspirin 81 MG chewable tablet Chew 81 mg by mouth daily.   carvedilol (COREG) 25 MG tablet Take 1 tablet (25 mg total) by mouth 2 (two) times daily.   empagliflozin (JARDIANCE) 10 MG TABS tablet Take 1 tablet (10 mg) by mouth once daily   gabapentin (NEURONTIN) 600 MG tablet Take 600 mg by mouth 3 (three) times daily.   insulin detemir (LEVEMIR) 100 UNIT/ML injection Inject 40 Units into the skin at bedtime.   insulin regular (NOVOLIN R) 100 units/mL injection Inject 6 Units into the skin in the morning, at noon, and at  bedtime.   metFORMIN (GLUCOPHAGE) 1000 MG tablet Take 1,000 mg by mouth 2 (two) times daily with a meal.   omeprazole (PRILOSEC) 20 MG capsule Take 20 mg by mouth daily.   sacubitril-valsartan (ENTRESTO) 97-103 MG Take 1 tablet by mouth 2 (two) times daily.   Semaglutide (OZEMPIC, 0.25 OR 0.5 MG/DOSE, [place]) Inject 0.5 mg into the skin once a week.   spironolactone (ALDACTONE) 25 MG tablet Take 25 mg by mouth daily.   torsemide (DEMADEX) 20 MG tablet Take 1 tablet (20 mg total) by mouth daily as needed (As needed for weight gain greater than 3 pounds overnight or 5 pounds in one week.).   No current facility-administered medications on file prior to visit.    Allergies:  Allergies  Allergen Reactions   Cashew Nut (Anacardium Occidentale) Skin Test Hives, Itching and Rash   Cashew Nut Oil Hives, Itching and Rash    Social History:  Social History   Socioeconomic History   Marital status: Single    Spouse name: Not on file   Number of children: Not on file   Years of education: Not on file   Highest education level: Not on file  Occupational History   Not on file  Tobacco Use   Smoking status: Never   Smokeless tobacco: Never  Vaping Use   Vaping Use: Never used  Substance and Sexual Activity   Alcohol use: Never   Drug use: Never   Sexual activity: Not on file  Other Topics Concern   Not on file  Social History Narrative   Not on file   Social Determinants of Health   Financial Resource Strain: Not on file  Food Insecurity: Not on file  Transportation Needs: Not on file  Physical Activity: Not on file  Stress: Not on file  Social Connections: Not on file  Intimate Partner Violence: Not on file   Social History   Tobacco Use  Smoking Status Never  Smokeless Tobacco Never   Social History   Substance and Sexual Activity  Alcohol Use Never    Family History:  Family History  Problem Relation Age of Onset   Heart failure Father    Hypertension Father     Kidney disease Father     Past medical history, surgical history, medications, allergies, family history and social history reviewed with patient today and changes made to appropriate areas of the chart.   ROS  All other ROS negative except what is listed above and in the HPI.      Objective:  There were no vitals taken for this visit.  Wt Readings from Last 3 Encounters:  07/26/22 272 lb 9.6 oz (123.7 kg)  07/13/22 274 lb (124.3 kg)  07/05/22 277 lb 9.6 oz (125.9 kg)    No results found.  Physical Exam      No data to display          Cognitive Testing - 6-CIT  Correct? Score   What year is it? {YES NO:22349} {Numbers; 0-4:31231} Yes = 0    No = 4  What month is it? {YES NO:22349} {Numbers; 0-4:31231} Yes = 0    No = 3  Remember:     Floyde Parkins, [address], [place]     What time is it? {YES NO:22349} {Numbers; 0-4:31231} Yes = 0    No = 3  Count backwards from 20 to 1 {YES NO:22349} {Numbers; 0-4:31231} Correct = 0    1 error = 2   More than 1 error = 4  Say the months of the year in reverse. {YES NO:22349} {Numbers; 0-4:31231} Correct = 0    1 error = 2   More than 1 error = 4  What address did I ask you to remember? {YES NO:22349} {NUMBERS; 0-10:5044} Correct = 0  1 error = 2    2 error = 4    3 error = 6    4 error = 8    All wrong = 10       TOTAL SCORE  {Numbers; 8-26:41583}/09   Interpretation:  {Desc; normal/abnormal:11317::"Normal"}  Normal (0-7) Abnormal (8-28)   Results for orders placed or performed in visit on 07/03/22  HM PAP SMEAR  Result Value Ref Range   HM Pap smear see results on chart       Assessment & Plan:   Problem List Items Addressed This Visit       Cardiovascular and Mediastinum   Essential hypertension - Primary   HFrEF (heart failure with reduced ejection fraction) (HCC)    Echo report from 06/21/22 reviewed and showed an EF of 35-40% with mild MR.        Endocrine   Diabetes mellitus (HCC)     Other   Morbid obesity  (HCC)     Preventative Services:  AAA screening:  Health Risk Assessment and Personalized Prevention Plan: Bone Mass Measurements: Breast Cancer Screening: CVD Screening:  Cervical Cancer Screening: Colon Cancer Screening:  Depression Screening:  Diabetes Screening:  Glaucoma Screening:  Hepatitis B vaccine: Hepatitis C screening:  HIV Screening: Flu Vaccine: Lung cancer Screening: Obesity Screening:  Pneumonia Vaccines (2): STI Screening:  Follow up plan: No follow-ups on file.   LABORATORY TESTING:  - Pap smear: {Blank single:19197::"pap done","not applicable","up to date","done elsewhere"}  IMMUNIZATIONS:   - Tdap: Tetanus vaccination status reviewed: {tetanus status:315746}. - Influenza: {Blank single:19197::"Up to date","Administered today","Postponed to flu season","Refused","Given elsewhere"} - Pneumovax: {Blank single:19197::"Up to date","Administered today","Not applicable","Refused","Given elsewhere"} - Prevnar: {Blank single:19197::"Up to date","Administered today","Not applicable","Refused","Given elsewhere"} - Zostavax vaccine: {Blank single:19197::"Up to date","Administered today","Not applicable","Refused","Given elsewhere"}  SCREENING: -Mammogram: {Blank single:19197::"Up to date","Ordered today","Not applicable","Refused","Done elsewhere"}  - Colonoscopy: {Blank single:19197::"Up to date","Ordered today","Not applicable","Refused","Done elsewhere"}  - Bone Density: {Blank single:19197::"Up to date","Ordered today","Not applicable","Refused","Done elsewhere"}  -Hearing Test: {Blank single:19197::"Up to date","Ordered today","Not applicable","Refused","Done elsewhere"}  -Spirometry: {Blank single:19197::"Up to date","Ordered today","Not applicable","Refused","Done elsewhere"}   PATIENT COUNSELING:   Advised to take 1 mg of folate supplement per day if capable of pregnancy.   Sexuality: Discussed sexually transmitted  diseases, partner selection, use of  condoms, avoidance of unintended pregnancy  and contraceptive alternatives.   Advised to avoid cigarette smoking.  I discussed with the patient that most people either abstain from alcohol or drink within safe limits (<=14/week and <=4 drinks/occasion for males, <=7/weeks and <= 3 drinks/occasion for females) and that the risk for alcohol disorders and other health effects rises proportionally with the number of drinks per week and how often a drinker exceeds daily limits.  Discussed cessation/primary prevention of drug use and availability of treatment for abuse.   Diet: Encouraged to adjust caloric intake to maintain  or achieve ideal body weight, to reduce intake of dietary saturated fat and total fat, to limit sodium intake by avoiding high sodium foods and not adding table salt, and to maintain adequate dietary potassium and calcium preferably from fresh fruits, vegetables, and low-fat dairy products.    stressed the importance of regular exercise  Injury prevention: Discussed safety belts, safety helmets, smoke detector, smoking near bedding or upholstery.   Dental health: Discussed importance of regular tooth brushing, flossing, and dental visits.    NEXT PREVENTATIVE PHYSICAL DUE IN 1 YEAR. No follow-ups on file.

## 2022-08-01 NOTE — Assessment & Plan Note (Deleted)
Echo report from 06/21/22 reviewed and showed an EF of 35-40% with mild MR.

## 2022-08-02 ENCOUNTER — Ambulatory Visit: Payer: Medicare (Managed Care) | Admitting: Nurse Practitioner

## 2022-08-06 LAB — HM DIABETES EYE EXAM

## 2022-08-14 ENCOUNTER — Ambulatory Visit (INDEPENDENT_AMBULATORY_CARE_PROVIDER_SITE_OTHER): Payer: Medicare (Managed Care) | Admitting: Nurse Practitioner

## 2022-08-14 ENCOUNTER — Encounter: Payer: Self-pay | Admitting: Nurse Practitioner

## 2022-08-14 VITALS — BP 120/85 | HR 84 | Temp 97.9°F | Wt 282.4 lb

## 2022-08-14 DIAGNOSIS — E1169 Type 2 diabetes mellitus with other specified complication: Secondary | ICD-10-CM | POA: Diagnosis not present

## 2022-08-14 DIAGNOSIS — Z794 Long term (current) use of insulin: Secondary | ICD-10-CM

## 2022-08-14 DIAGNOSIS — F339 Major depressive disorder, recurrent, unspecified: Secondary | ICD-10-CM

## 2022-08-14 DIAGNOSIS — I5020 Unspecified systolic (congestive) heart failure: Secondary | ICD-10-CM | POA: Diagnosis not present

## 2022-08-14 DIAGNOSIS — I10 Essential (primary) hypertension: Secondary | ICD-10-CM

## 2022-08-14 DIAGNOSIS — E781 Pure hyperglyceridemia: Secondary | ICD-10-CM

## 2022-08-14 DIAGNOSIS — F419 Anxiety disorder, unspecified: Secondary | ICD-10-CM

## 2022-08-14 DIAGNOSIS — Z1159 Encounter for screening for other viral diseases: Secondary | ICD-10-CM

## 2022-08-14 DIAGNOSIS — Z23 Encounter for immunization: Secondary | ICD-10-CM | POA: Diagnosis not present

## 2022-08-14 DIAGNOSIS — G4733 Obstructive sleep apnea (adult) (pediatric): Secondary | ICD-10-CM

## 2022-08-14 MED ORDER — CITALOPRAM HYDROBROMIDE 10 MG PO TABS: 10.0000 mg | ORAL_TABLET | Freq: Every day | ORAL | 0 refills | Status: DC

## 2022-08-14 MED ORDER — ROSUVASTATIN CALCIUM 5 MG PO TABS: 5.0000 mg | ORAL_TABLET | Freq: Every day | ORAL | 1 refills | Status: DC

## 2022-08-14 NOTE — Progress Notes (Unsigned)
BP 120/85   Pulse 84   Temp 97.9 F (36.6 C) (Oral)   Wt 282 lb 6.4 oz (128.1 kg)   SpO2 98%   BMI 39.39 kg/m    Subjective:    Patient ID: Emily Hinton, female    DOB: 1961/03/05, 61 y.o.   MRN: [id number]  HPI: Emily Hinton- Emily Hinton is a 61 y.o. female  Chief Complaint  Patient presents with   Hypertension   Hyperlipidemia   Diabetes    1 month follow up    HYPERTENSION / HYPERLIPIDEMIA/HF Not weighing daily Satisfied with current treatment? yes Duration of hypertension: years BP monitoring frequency: daily BP range: 120/70 BP medication side effects: no Past BP meds: carvedilol and valsartan Duration of hyperlipidemia: years Cholesterol medication side effects: no Cholesterol supplements: none Past cholesterol medications: rosuvastatin (crestor) Medication compliance: excellent compliance Aspirin: no Recent stressors: no Recurrent headaches: no Visual changes: no Palpitations: no Dyspnea: no Chest pain: no Lower extremity edema: no Dizzy/lightheaded: no  DIABETES Hypoglycemic episodes:no Polydipsia/polyuria: no Visual disturbance: no Chest pain: no Paresthesias: no Glucose Monitoring: yes  Accucheck frequency: Daily  Fasting glucose: 130-160  Post prandial:  Evening:  Before meals: Taking Insulin?: yes  Long acting insulin: levemir 40u  Short acting insulin: Novolin 6u TID Blood Pressure Monitoring: daily Retinal Examination: Up to Date Foot Exam:  up to date Diabetic Education: Not Completed Pneumovax: Up to Date Influenza: Up to Date Aspirin: no  VP SHUNT Has seen Neurology.  Completed the xray requested by Neurology.  Relevant past medical, surgical, family and social history reviewed and updated as indicated. Interim medical history since our last visit reviewed. Allergies and medications reviewed and updated.  Review of Systems  Eyes:  Negative for visual disturbance.  Respiratory:  Negative for cough,  chest tightness and shortness of breath.   Cardiovascular:  Negative for chest pain, palpitations and leg swelling.  Endocrine: Negative for polydipsia and polyuria.  Neurological:  Negative for dizziness, numbness and headaches.    Per HPI unless specifically indicated above     Objective:    BP 120/85   Pulse 84   Temp 97.9 F (36.6 C) (Oral)   Wt 282 lb 6.4 oz (128.1 kg)   SpO2 98%   BMI 39.39 kg/m   Wt Readings from Last 3 Encounters:  08/14/22 282 lb 6.4 oz (128.1 kg)  07/26/22 272 lb 9.6 oz (123.7 kg)  07/13/22 274 lb (124.3 kg)    Physical Exam Vitals and nursing note reviewed.  Constitutional:      General: She is not in acute distress.    Appearance: Normal appearance. She is obese. She is not ill-appearing, toxic-appearing or diaphoretic.  HENT:     Head: Normocephalic.     Right Ear: External ear normal.     Left Ear: External ear normal.     Nose: Nose normal.     Mouth/Throat:     Mouth: Mucous membranes are moist.     Pharynx: Oropharynx is clear.  Eyes:     General:        Right eye: No discharge.        Left eye: No discharge.     Extraocular Movements: Extraocular movements intact.     Conjunctiva/sclera: Conjunctivae normal.     Pupils: Pupils are equal, round, and reactive to light.  Cardiovascular:     Rate and Rhythm: Normal rate and regular rhythm.     Heart sounds: No murmur heard. Pulmonary:  Effort: Pulmonary effort is normal. No respiratory distress.     Breath sounds: Normal breath sounds. No wheezing or rales.  Musculoskeletal:     Cervical back: Normal range of motion and neck supple.  Skin:    General: Skin is warm and dry.     Capillary Refill: Capillary refill takes less than 2 seconds.  Neurological:     General: No focal deficit present.     Mental Status: She is alert and oriented to person, place, and time. Mental status is at baseline.  Psychiatric:        Mood and Affect: Mood normal.        Behavior: Behavior  normal.        Thought Content: Thought content normal.        Judgment: Judgment normal.     Results for orders placed or performed in visit on 08/14/22  Comp Met (CMET)  Result Value Ref Range   Glucose 221 (H) 70 - 99 mg/dL   BUN 12 6 - 24 mg/dL   Creatinine, Ser 0.93 0.57 - 1.00 mg/dL   eGFR 71 >59 mL/min/1.73   BUN/Creatinine Ratio 13 9 - 23   Sodium 138 134 - 144 mmol/L   Potassium 4.4 3.5 - 5.2 mmol/L   Chloride 101 96 - 106 mmol/L   CO2 23 20 - 29 mmol/L   Calcium 9.4 8.7 - 10.2 mg/dL   Total Protein 7.3 6.0 - 8.5 g/dL   Albumin 4.3 3.8 - 4.9 g/dL   Globulin, Total 3.0 1.5 - 4.5 g/dL   Albumin/Globulin Ratio 1.4 1.2 - 2.2   Bilirubin Total <0.2 0.0 - 1.2 mg/dL   Alkaline Phosphatase 104 44 - 121 IU/L   AST 13 0 - 40 IU/L   ALT 16 0 - 32 IU/L  Lipid Profile  Result Value Ref Range   Cholesterol, Total 170 100 - 199 mg/dL   Triglycerides 171 (H) 0 - 149 mg/dL   HDL 50 >39 mg/dL   VLDL Cholesterol Cal 29 5 - 40 mg/dL   LDL Chol Calc (NIH) 91 0 - 99 mg/dL   Chol/HDL Ratio 3.4 0.0 - 4.4 ratio  Hepatitis C Antibody  Result Value Ref Range   Hep C Virus Ab Non Reactive Non Reactive      Assessment & Plan:   Problem List Items Addressed This Visit       Cardiovascular and Mediastinum   Essential hypertension    Chronic.  Controlled.  Continue with current medication regimen of Carvedilol and Valsartan (Entresto).  Labs ordered today.  Return to clinic in 3 months for reevaluation.  Call sooner if concerns arise.        Relevant Medications   rosuvastatin (CRESTOR) 5 MG tablet   HFrEF (heart failure with reduced ejection fraction) (HCC) - Primary    Chronic.  Followed by HF clinic.  Echo report from 06/21/22 reviewed and showed an EF of 35-40% with mild MR.  Patient is not weighing daily.  Encouraged her to weigh herself.  Has gained weight since previous visit but does not appear volume overloaded at visit.  - Reminded to call for an overnight weight gain of >2  pounds or a weekly weight gain of >5 pounds - not adding salt to food and read food labels. Reviewed the importance of keeping daily sodium intake to <2010m daily. - Avoid Ibuprofen products.       Relevant Medications   rosuvastatin (CRESTOR) 5 MG tablet   Other  Relevant Orders   Comp Met (CMET) (Completed)     Endocrine   Diabetes mellitus ([place])    Chronic. Not well controlled.  A1c checked on 9/14 showed 9.8.  On insulin Levemir 40u and Novolin 6u, Jardiance, ozempic 0.72m, and Metformin.  Changed Levemir to 50u daily.  Patient has had trouble tolerating Ozempic at higher doses.  [facility]  Will follow up at next visit to check sugars.  States she has increased to Ozempic 170mbefore and had difficulty with side effects.  Will follow up in 1 month for reevaluation.  Call [facility] if concerns arise.         Relevant Medications   rosuvastatin (CRESTOR) 5 MG tablet     Other   Morbid obesity ([facility]   Recommend a healthy lifestyle through diet and exercise.       Hypertriglyceridemia    Chronic.  Controlled.  Continue with current medication regimen.  Labs ordered today.  Return to clinic in 6 months for reevaluation.  Call sooner if concerns arise.        Relevant Medications   rosuvastatin (CRESTOR) 5 MG tablet   Other Relevant Orders   Lipid Profile (Completed)   Depression, recurrent (HCC)    Chronic. Ongoing.  Has a lot of caregiver strain due to caring for her Mother and Father.  [place].  Will start Celexa 1026maily.  Side effects and benefits discussed.  Follow up in 1 month.  Call sooner if concerns arise.       Relevant Medications   citalopram (CELEXA) 10 MG tablet   Anxiety    Chronic. Ongoing.  Has a lot of caregiver strain due to caring for her Mother and Father.  [place].  Will start Celexa 46m40mily.  Side effects and benefits discussed.  Follow up in 1 month.  Call sooner if concerns arise.       Relevant Medications   citalopram (CELEXA) 10 MG tablet   Other Visit Diagnoses     Encounter for hepatitis C screening test for low risk patient       Relevant Orders   Hepatitis C Antibody (Completed)   Need for influenza vaccination       Relevant Orders   Flu Vaccine QUAD 6+ mos PF IM (Fluarix Quad PF) (Completed)        Follow up plan: Return in about 1 month (around 09/13/2022) for Depression/Anxiety FU.

## 2022-08-15 DIAGNOSIS — F339 Major depressive disorder, recurrent, unspecified: Secondary | ICD-10-CM | POA: Insufficient documentation

## 2022-08-15 DIAGNOSIS — F419 Anxiety disorder, unspecified: Secondary | ICD-10-CM | POA: Insufficient documentation

## 2022-08-15 LAB — LIPID PANEL
Chol/HDL Ratio: 3.4 ratio (ref 0.0–4.4)
Cholesterol, Total: 170 mg/dL (ref 100–199)
HDL: 50 mg/dL (ref >39)
LDL Chol Calc (NIH): 91 mg/dL (ref 0–99)
Triglycerides: 171 mg/dL — ABNORMAL HIGH (ref 0–149)
VLDL Cholesterol Cal: 29 mg/dL (ref 5–40)

## 2022-08-15 LAB — COMPREHENSIVE METABOLIC PANEL
ALT: 16 IU/L (ref 0–32)
AST: 13 IU/L (ref 0–40)
Albumin/Globulin Ratio: 1.4 (ref 1.2–2.2)
Albumin: 4.3 g/dL (ref 3.8–4.9)
Alkaline Phosphatase: 104 IU/L (ref 44–121)
BUN/Creatinine Ratio: 13 (ref 9–23)
BUN: 12 mg/dL (ref 6–24)
Bilirubin Total: <0.2 mg/dL (ref 0.0–1.2)
CO2: 23 mmol/L (ref 20–29)
Calcium: 9.4 mg/dL (ref 8.7–10.2)
Chloride: 101 mmol/L (ref 96–106)
Creatinine, Ser: 0.93 mg/dL (ref 0.57–1.00)
Globulin, Total: 3 g/dL (ref 1.5–4.5)
Glucose: 221 mg/dL — ABNORMAL HIGH (ref 70–99)
Potassium: 4.4 mmol/L (ref 3.5–5.2)
Sodium: 138 mmol/L (ref 134–144)
Total Protein: 7.3 g/dL (ref 6.0–8.5)
eGFR: 71 mL/min/{1.73_m2} (ref >59)

## 2022-08-15 LAB — HEPATITIS C ANTIBODY: Hep C Virus Ab: NONREACTIVE

## 2022-08-15 NOTE — Assessment & Plan Note (Signed)
Chronic. Ongoing.  Has a lot of caregiver strain due to caring for her Mother and Father.  One parent is in [place] and one is in [place] and requiring her to travel frequently.  Will start Celexa 10mg  daily.  Side effects and benefits discussed.  Follow up in 1 month.  Call sooner if concerns arise.

## 2022-08-15 NOTE — Assessment & Plan Note (Signed)
Chronic. Not well controlled.  A1c checked on 9/14 showed 9.8.  On insulin Levemir 40u and Novolin 6u, Jardiance, ozempic 0.5mg , and Metformin.  Changed Levemir to 50u daily.  Patient has had trouble tolerating Ozempic at higher doses.  Would like to change patient from Ozempic to [facility] if insurance approves.  Will follow up at next visit to check sugars.  States she has increased to Ozempic 1mg  before and had difficulty with side effects.  Will follow up in 1 month for reevaluation.  Call [facility] if concerns arise.

## 2022-08-15 NOTE — Assessment & Plan Note (Signed)
Chronic.  Controlled.  Continue with current medication regimen of Carvedilol and Valsartan (Entresto).  Labs ordered today.  Return to clinic in 3 months for reevaluation.  Call sooner if concerns arise.   

## 2022-08-15 NOTE — Progress Notes (Signed)
Please let patietn know that her lab work looks good.  Glucose is elevated to 221.  Make sure to increase the Levemir to 50U.  Otherwise, blood work looks good.  No other concerns at this time.

## 2022-08-15 NOTE — Assessment & Plan Note (Signed)
Recommend a healthy lifestyle through diet and exercise.  °

## 2022-08-15 NOTE — Assessment & Plan Note (Signed)
Chronic.  Followed by HF clinic.  Echo report from 06/21/22 reviewed and showed an EF of 35-40% with mild MR.  Patient is not weighing daily.  Encouraged her to weigh herself.  Has gained weight since previous visit but does not appear volume overloaded at visit.  - Reminded to call for an overnight weight gain of >2 pounds or a weekly weight gain of >5 pounds - not adding salt to food and read food labels. Reviewed the importance of keeping daily sodium intake to 2000mg  daily. - Avoid Ibuprofen products.

## 2022-08-15 NOTE — Assessment & Plan Note (Signed)
Chronic. Ongoing.  Has a lot of caregiver strain due to caring for her Mother and Father.  One parent is in [place] and one is in [place] and requiring her to travel frequently.  Will start Celexa 10mg daily.  Side effects and benefits discussed.  Follow up in 1 month.  Call sooner if concerns arise.  

## 2022-08-15 NOTE — Assessment & Plan Note (Signed)
Chronic.  Controlled.  Continue with current medication regimen.  Labs ordered today.  Return to clinic in 6 months for reevaluation.  Call sooner if concerns arise.  ? ?

## 2022-08-23 ENCOUNTER — Ambulatory Visit: Payer: Medicare (Managed Care) | Admitting: Physician Assistant

## 2022-09-13 NOTE — Progress Notes (Deleted)
Cardiology Office Note    Date:  09/13/2022   ID:  Emily Hinton, DOB 1961/09/22, MRN [id number]  PCP:  Larae Grooms, NP  Cardiologist:  Julien Nordmann, MD  Electrophysiologist:  None   Chief Complaint: Follow-up  History of Present Illness:   Emily Hinton is a 61 y.o. 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   [name] was previously followed by cardiology in [place]. She was found to have a cardiomyopathy in 2018 with an EF of 45%. LHC at that time showed nonobstructive coronary arteries.  Echo in 05/2018 demonstrated a persistent cardiomyopathy with an EF of 45%, global hypokinesis, and diastolic dysfunction. This finding was unchanged when compared to echo in 2018.  Subsequent echo in 04/2021 showed an improved LV systolic function with an EF of 55%, probable normal LV wall motion, and diastolic dysfunction. She moved to [facility] from [place] several years ago.    She was admitted to the hospital in 01/2022 with worsening dyspnea and productive cough and found to have multilobar pneumonia with mild volume overload.  She was hypertensive and tachycardic upon presentation with an oxygen saturation of 72% on room air.  High-sensitivity troponin 35 with a delta troponin of 20.  BNP 189.  Chest x-ray showed volume overload versus pneumonia with small pleural effusions.  CT of the chest showed multilobar pneumonia with minimal pleural fluid on the left.  Echo during this admission showed an EF of 40-45%, mildly to moderately dilated LV internal cavity size, global hypokinesis, Gr2DD, normal RV systolic function and ventricular cavity size, and mild mitral  regurgitation.     Coronary CTA on 03/01/2022 showed a calcium score of 0, with no evidence of CAD.  Noncardiac over read was notable for near complete resolution of groundglass opacities when compared to imaging from 01/2022.  There were bandlike changes along the posterior pleural space felt to likely reflect a mixture of atelectasis and postinflammatory changes that were also improving.  It was recommended she follow-up with her PCP for these improving changes.   She was seen in the office on 04/12/2022 and was without symptoms of angina or decompensation.  She had stable 2-3 pillow orthopnea given prior CSF leak.  She remained on Entresto and had been started on lisinopril by outside office.  It was recommended that she discontinue lisinopril.  Amlodipine was also discontinued and Entresto was titrated to 49/51 mg twice daily.  She was otherwise continued on carvedilol, spironolactone, torsemide, and Jardiance.   She was seen in the office on 05/17/2022 and continued to do well from a cardiac perspective, without symptoms of angina or decompensation.  Her weight was stable by our scale.  Entresto was titrated to 97/103 mg twice daily and carvedilol was titrated to 12.5 mg twice daily.  She was otherwise continued on Jardiance, spironolactone, and torsemide.  Labs showed mild AKI at that time leading torsemide to be transitioned to as needed.  Follow-up limited echo 06/21/2022 showed a persistent cardiomyopathy with an EF of 35 to 40%, global hypokinesis, mildly dilated internal LV cavity size, grade 1 diastolic dysfunction, normal RV systolic function and ventricular cavity size, mild mitral regurgitation, and an  estimated right atrial pressure of 3 mmHg.    She was last seen in the office on 07/05/2022 and remained without symptoms of angina or decompensation with stable chronic dyspnea.  Her weight was stable.  Carvedilol was titrated to 25 mg daily with continuation of Entresto, Jardiance, spironolactone, and  torsemide.  Cardiac MRI on 07/18/2022 demonstrated an LVEF of 32% with no evidence of MI, scar, or LGE with normal RV systolic function and ventricular cavity size and no significant valvular abnormalities.  Findings were suggestive of nonischemic cardiomyopathy.  ***   Labs independently reviewed: 07/2022 - TC 170, TG 171, HDL 50, LDL 91, BUN 12, serum creatinine 0.93, potassium 4.4, albumin 4.3, AST/ALT normal 06/2022 - A1c 9 8 09/2021 - TSH normal, magnesium 2.0   Past Medical History:  Diagnosis Date   CHF (congestive heart failure) (HCC)    CSF leak    Diabetes mellitus without complication (HCC)    DM2 (diabetes mellitus, type 2) (HCC)    Essential hypertension    Heart attack (HCC)    Hyperlipidemia    Morbid obesity (HCC)    NICM (nonischemic cardiomyopathy) (HCC)    Nonobstructive atherosclerosis of coronary artery    OSA (obstructive sleep apnea)     Past Surgical History:  Procedure Laterality Date   CERVICAL FUSION  2017   CSF leak repair x 2     2014 and 2017   CSF SHUNT  2017    Current Medications: No outpatient medications have been marked as taking for the 09/21/22 encounter (Appointment) with Sondra Barges, PA-C.    Allergies:   Cashew nut (anacardium occidentale) skin test and Cashew nut oil   Social History   Socioeconomic History   Marital status: Single    Spouse name: Not on file   Number of children: Not on file   Years of education: Not on file   Highest education level: Not on file  Occupational History   Not on file  Tobacco Use   Smoking status: Never   Smokeless tobacco: Never  Vaping Use   Vaping Use: Never used  Substance and Sexual Activity   Alcohol use: Never   Drug use: Never   Sexual activity: Not on file  Other Topics Concern   Not on file  Social History Narrative   Not on file   Social Determinants of Health   Financial Resource Strain: Not on file  Food Insecurity: Not on file  Transportation Needs: Not on file   Physical Activity: Not on file  Stress: Not on file  Social Connections: Not on file     Family History:  The patient's family history includes Heart failure in her father; Hypertension in her father; Kidney disease in her father.  ROS:   ROS   EKGs/Labs/Other Studies Reviewed:    Studies reviewed were summarized above. The additional studies were reviewed today:  2D echo 01/30/2022: 1. Left ventricular ejection fraction, by estimation, is 40 to 45%. The  left ventricle has mildly decreased function. The left ventricle  demonstrates global hypokinesis. The left ventricular internal cavity size  was mildly to moderately dilated. Left  ventricular diastolic parameters are consistent with Grade II diastolic  dysfunction (pseudonormalization). Elevated left atrial pressure.   2. Right ventricular systolic function is normal. The right ventricular  size is normal. Tricuspid regurgitation signal is inadequate for assessing  PA pressure.   3. The mitral valve is normal in structure. Mild mitral valve  regurgitation. No evidence of  mitral stenosis.   4. The aortic valve was not well visualized. Aortic valve regurgitation  is not visualized. No aortic stenosis is present. __________   Coronary CTA 03/01/2022: Noncardiac over read -  1. Near complete resolution of ground-glass opacities in the chest when compared to the recent study from April of 2023. 2. Bandlike changes along the posterior pleural surface likely a mixture of atelectasis and post inflammatory changes also showing improvement since previous imaging. This may reflect developing post inflammatory fibrosis with and is mixed atelectatic changes at the lung bases, suggest attention on follow-up.   Cardiac overread -  Aorta:  Normal size.  No calcifications.  No dissection.   Aortic Valve:  Trileaflet.  No calcifications.   Coronary Arteries:  Normal coronary origin.  Right dominance.   RCA is a dominant artery that  gives rise to PDA and PLA. There is no plaque.   Left main gives rise to LAD and LCX arteries. There is no LM disease.   LAD has no plaque.   LCX is a non-dominant artery that gives rise to a large obtuse marginal branch. There is no plaque.   Other findings:   Normal pulmonary vein drainage into the left atrium.   Normal left atrial appendage without a thrombus.   Normal size of the pulmonary artery.   IMPRESSION: 1. Normal coronary calcium score of 0. Patient is low risk for coronary events. 2. Normal coronary origin with right dominance. 3. No evidence of CAD. 4. CAD-RADS 0. Consider non-atherosclerotic causes of chest pain. __________   Limited echo 06/21/2022: 1. Left ventricular ejection fraction, by estimation, is 35 to 40%. The  left ventricle has moderately decreased function. The left ventricle  demonstrates global hypokinesis. The left ventricular internal cavity size  was mildly dilated. Left ventricular  diastolic parameters are consistent with Grade I diastolic dysfunction  (impaired relaxation).   2. Right ventricular systolic function is normal. The right ventricular  size is normal. Tricuspid regurgitation signal is inadequate for assessing  PA pressure.   3. The mitral valve is normal in structure. Mild mitral valve  regurgitation. No evidence of mitral stenosis.   4. The aortic valve is normal in structure. Aortic valve regurgitation is  not visualized. No aortic stenosis is present.   5. The inferior vena cava is normal in size with greater than 50%  respiratory variability, suggesting right atrial pressure of 3 mmHg. __________  Cardiac MRI 07/18/2022: FINDINGS: 1. Mildly dilated left ventricular size. Moderately reduced LV systolic function (LVEF = 32%). There is global hypokinesis with no regional wall motion abnormalities.   There is no late gadolinium enhancement in the left ventricular myocardium.   LVEDV: 276 ml   LVESV: 187 ml   SV:  89 ml   CO: 4.9 L/min   Myocardial mass: 152   2. Normal right ventricular size, thickness and low normal systolic function (RVEF = 47%). There are no regional wall motion abnormalities.   3.  Normal left and right atrial size.   4. Normal size of the aortic root, ascending aorta and pulmonary artery. Normal pulmonic and aortic flow. Qp:Qs = 1.05   5.  Trace MR, Trace TR. no other significant valvular abnormalities.   6.  Normal pericardium.  No pericardial effusion.   IMPRESSION: 1.  Mildly dilated LV, moderately reduced LV function.  LVEF = 32% 2. No evidence for myocardial infiltration, late gadolinium enhancement or scar. 3.  No significant valvular abnormalities. 4.  Normal RV size  and function 5.  Findings suggest non-ischemic cardiomyopathy.    EKG:  EKG is ordered today.  The EKG ordered today demonstrates ***  Recent Labs: 01/30/2022: B Natriuretic Peptide 189.7 02/03/2022: Hemoglobin 13.8; Platelets 330 08/14/2022: ALT 16; BUN 12; Creatinine, Ser 0.93; Potassium 4.4; Sodium 138  Recent Lipid Panel    Component Value Date/Time   CHOL 170 08/14/2022 1514   TRIG 171 (H) 08/14/2022 1514   HDL 50 08/14/2022 1514   CHOLHDL 3.4 08/14/2022 1514   LDLCALC 91 08/14/2022 1514    PHYSICAL EXAM:    VS:  There were no vitals taken for this visit.  BMI: There is no height or weight on file to calculate BMI.  Physical Exam  Wt Readings from Last 3 Encounters:  08/14/22 282 lb 6.4 oz (128.1 kg)  07/26/22 272 lb 9.6 oz (123.7 kg)  07/13/22 274 lb (124.3 kg)     ASSESSMENT & PLAN:   HFrEF secondary to NICM:  Nonobstructive CAD:  HTN: Blood pressure  Abnormal chest CT: Noncardiac over read of coronary CTA showed improving inflammatory changes as well as near complete resolution of groundglass opacities.    {Are you ordering a CV Procedure (e.g. stress test, cath, DCCV, TEE, etc)?   Press F2        :[id number]}     Disposition: F/u with Dr. Mariah Milling or an APP  in ***.   Medication Adjustments/Labs and Tests Ordered: Current medicines are reviewed at length with the patient today.  Concerns regarding medicines are outlined above. Medication changes, Labs and Tests ordered today are summarized above and listed in the Patient Instructions accessible in Encounters.   Signed, Eula Listen, PA-C 09/13/2022 10:38 AM     [facility] -  [address] [place], [place] [address] [phone]

## 2022-09-17 ENCOUNTER — Ambulatory Visit: Payer: Medicare Other | Admitting: Nurse Practitioner

## 2022-09-21 ENCOUNTER — Ambulatory Visit: Payer: Medicare (Managed Care) | Admitting: Physician Assistant

## 2022-10-04 NOTE — Progress Notes (Signed)
BP 95/70   Pulse (!) 102   Temp 98.6 F (37 C) (Oral)   Wt 273 lb 11.2 oz (124.1 kg)   SpO2 98%   BMI 38.17 kg/m    Subjective:    Patient ID: Emily Hinton, female    DOB: 1961/05/08, 61 y.o.   MRN: [id number]  HPI: Emily Hinton is a 61 y.o. female  Chief Complaint  Patient presents with   Depression   Anxiety   DEPRESSION/ANXIETY Patient states she feels like her mood hasn't been great.  States her son had his kids there and it has been more stressful for her.  Her anxiety has worsened with driving.  She does feel like the medication helps some.  She is interested in increasing the dose.  Denies SI.   Flowsheet Row Office Visit from 10/05/2022 in [facility]  PHQ-9 Total Score 16         10/05/2022   11:02 AM 08/14/2022    2:21 PM 07/03/2022   11:07 AM 02/13/2022    1:23 PM  GAD 7 : Generalized Anxiety Score  Nervous, Anxious, on Edge 1 3 0 1  Control/stop worrying 1 3 2 1   Worry too much - different things 2 3 2 1   Trouble relaxing 3 2 2  0  Restless 2 0 0 0  Easily annoyed or irritable 0 2 0 0  Afraid - awful might happen 0 0 0 0  Total GAD 7 Score 9 13 6 3   Anxiety Difficulty Somewhat difficult Very difficult Not difficult at all Somewhat difficult      Patient states she feels like she has mucous in her chest.  States she isn't short of breath but feels congestion in her chest.  Relevant past medical, surgical, family and social history reviewed and updated as indicated. Interim medical history since our last visit reviewed. Allergies and medications reviewed and updated.  Review of Systems  HENT:  Positive for congestion.   Respiratory:  Positive for cough. Negative for shortness of breath and wheezing.   Psychiatric/Behavioral:  Positive for decreased concentration. Negative for sleep disturbance. The patient is nervous/anxious.     Per HPI unless specifically indicated above     Objective:    BP 95/70    Pulse (!) 102   Temp 98.6 F (37 C) (Oral)   Wt 273 lb 11.2 oz (124.1 kg)   SpO2 98%   BMI 38.17 kg/m   Wt Readings from Last 3 Encounters:  10/05/22 273 lb 11.2 oz (124.1 kg)  08/14/22 282 lb 6.4 oz (128.1 kg)  07/26/22 272 lb 9.6 oz (123.7 kg)    Physical Exam Vitals and nursing note reviewed.  Constitutional:      General: She is not in acute distress.    Appearance: Normal appearance. She is normal weight. She is not ill-appearing, toxic-appearing or diaphoretic.  HENT:     Head: Normocephalic.     Right Ear: External ear normal.     Left Ear: External ear normal.     Nose: Nose normal.     Mouth/Throat:     Mouth: Mucous membranes are moist.     Pharynx: Oropharynx is clear.  Eyes:     General:        Right eye: No discharge.        Left eye: No discharge.     Extraocular Movements: Extraocular movements intact.     Conjunctiva/sclera: Conjunctivae normal.     Pupils: Pupils  are equal, round, and reactive to light.  Cardiovascular:     Rate and Rhythm: Normal rate and regular rhythm.     Heart sounds: No murmur heard. Pulmonary:     Effort: Pulmonary effort is normal. No respiratory distress.     Breath sounds: Normal breath sounds. No wheezing or rales.  Musculoskeletal:     Cervical back: Normal range of motion and neck supple.  Skin:    General: Skin is warm and dry.     Capillary Refill: Capillary refill takes less than 2 seconds.  Neurological:     General: No focal deficit present.     Mental Status: She is alert and oriented to person, place, and time. Mental status is at baseline.  Psychiatric:        Mood and Affect: Mood normal.        Behavior: Behavior normal.        Thought Content: Thought content normal.        Judgment: Judgment normal.     Results for orders placed or performed in visit on 08/23/22  HM DIABETES EYE EXAM  Result Value Ref Range   HM Diabetic Eye Exam No Retinopathy No Retinopathy      Assessment & Plan:   Problem  List Items Addressed This Visit       Other   Depression, recurrent ([place])    Chronic. Not well controlled.  Will increase Celexa to 20mg .  Patient feels like medication is improving symptoms.  Follow up in 2 months. Call sooner if concerns arise.       Relevant Medications   citalopram (CELEXA) 20 MG tablet   Other Visit Diagnoses     Upper respiratory tract infection, unspecified type    -  Primary   Likely viral in nature.  Will treat with prednisone to decrease inflammation.        Follow up plan: Return in about 2 months (around 12/06/2022) for HTN, HLD, DM2 FU.

## 2022-10-05 ENCOUNTER — Ambulatory Visit (INDEPENDENT_AMBULATORY_CARE_PROVIDER_SITE_OTHER): Payer: Medicare Other | Admitting: Nurse Practitioner

## 2022-10-05 ENCOUNTER — Encounter: Payer: Self-pay | Admitting: Nurse Practitioner

## 2022-10-05 VITALS — BP 95/70 | HR 102 | Temp 98.6°F | Wt 273.7 lb

## 2022-10-05 DIAGNOSIS — J069 Acute upper respiratory infection, unspecified: Secondary | ICD-10-CM | POA: Diagnosis not present

## 2022-10-05 DIAGNOSIS — F339 Major depressive disorder, recurrent, unspecified: Secondary | ICD-10-CM | POA: Diagnosis not present

## 2022-10-05 MED ORDER — CITALOPRAM HYDROBROMIDE 20 MG PO TABS: 20.0000 mg | ORAL_TABLET | Freq: Every day | ORAL | 0 refills | Status: DC

## 2022-10-05 MED ORDER — METHYLPREDNISOLONE 4 MG PO TBPK: ORAL_TABLET | ORAL | 0 refills | Status: DC

## 2022-10-05 NOTE — Assessment & Plan Note (Signed)
Chronic. Not well controlled.  Will increase Celexa to 20mg .  Patient feels like medication is improving symptoms.  Follow up in 2 months. Call sooner if concerns arise.

## 2022-10-17 NOTE — Progress Notes (Signed)
Cardiology Office Note    Date:  10/19/2022   ID:  Emily Hinton, DOB May 18, 1961, MRN [id number]  PCP:  Jon Billings, NP  Cardiologist:  Ida Rogue, MD  Electrophysiologist:  None   Chief Complaint: Follow up  History of Present Illness:   Emily Hinton is a 62 y.o. 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   Emily Hinton was previously followed by cardiology in [place]. She was found to have a cardiomyopathy in 2018 with an EF of 45%. LHC at that time showed nonobstructive coronary arteries.  Echo in 05/2018 demonstrated a persistent cardiomyopathy with an EF of 45%, global hypokinesis, and diastolic dysfunction. This finding was unchanged when compared to echo in 2018.  Subsequent echo in 04/2021 showed an improved LV systolic function with an EF of 55%, probable normal LV wall motion, and diastolic dysfunction. She moved to [facility] from [place] several years ago.    She was admitted to the hospital in 01/2022 with worsening dyspnea and productive cough and found to have multilobar pneumonia with mild volume overload.  She was hypertensive and tachycardic upon presentation with an oxygen saturation of 72% on room air.  High-sensitivity troponin 35 with a delta troponin of 20.  BNP 189.  Chest x-ray showed volume overload versus pneumonia with small pleural effusions.  CT of the chest showed multilobar pneumonia with minimal pleural fluid on the left.  Echo during this admission showed an EF of 40-45%, mildly to moderately dilated LV internal cavity size, global hypokinesis, Gr2DD, normal RV systolic function and  ventricular cavity size, and mild mitral regurgitation.     Coronary CTA on 03/01/2022 showed a calcium score of 0, with no evidence of CAD.  Noncardiac over read was notable for near complete resolution of groundglass opacities when compared to imaging from 01/2022.  There were bandlike changes along the posterior pleural space felt to likely reflect a mixture of atelectasis and postinflammatory changes that were also improving.  It was recommended she follow-up with her PCP for these improving changes.   She was seen in the office on 04/12/2022 and was without symptoms of angina or decompensation.  She had stable 2-3 pillow orthopnea given prior CSF leak.  She remained on Entresto and had been started on lisinopril by outside office.  It was recommended that she discontinue lisinopril.  Amlodipine was also discontinued and Entresto was titrated to 49/51 mg twice daily.  She was otherwise continued on carvedilol, spironolactone, torsemide, and Jardiance.   She was seen in the office on 05/17/2022 and continued to do well from a cardiac perspective, without symptoms of angina or decompensation.  Her weight was stable by our scale.  Entresto was titrated to 97/103 mg twice daily and carvedilol was titrated to 12.5 mg twice daily.  She was otherwise continued on Jardiance, spironolactone, and torsemide.  Labs showed mild AKI at that time leading torsemide to be transitioned to as needed.  Follow-up limited echo 06/21/2022 showed a persistent cardiomyopathy with an EF of 35 to 40%, global hypokinesis, mildly dilated internal LV cavity size, grade 1 diastolic dysfunction, normal RV systolic function and ventricular  cavity size, mild mitral regurgitation, and an estimated right atrial pressure of 3 mmHg.    She was last seen in the office on 07/05/2022 and remained without symptoms of angina or decompensation with stable chronic dyspnea.  Her weight was stable.  Carvedilol was titrated to 25 mg daily with continuation of  Entresto, Jardiance, spironolactone, and torsemide.  Cardiac MRI on 07/18/2022 demonstrated an LVEF of 32% with no evidence of MI, scar, or LGE with normal RV systolic function and ventricular cavity size and no significant valvular abnormalities.  Findings were suggestive of nonischemic cardiomyopathy.  She comes in doing well from a cardiac perspective and is without symptoms of angina or decompensation.  Her weight is down 2 pounds by our scale when compared to her last clinic visit.  She has decreased her fluid intake some as she felt like she was drinking too much liquid.  With this, she has noted some improvement in her breathing.  She has stable 2-3 pillow orthopnea given prior CSF leak.  No significant lower extremity swelling or abdominal distention.  She is adherent and tolerating all cardiac medications without issues.  She is considering relocation to [place] to live near her sisters, though has not made a formal plan at this time.  No dizziness, presyncope, or syncope.  She does request refill of Ozempic and Levemir.   Labs independently reviewed: 07/2022 - TC 170, TG 171, HDL 50, LDL 91, BUN 12, serum creatinine 0.93, potassium 4.4, albumin 4.3, AST/ALT normal 06/2022 - A1c 9 8 09/2021 - TSH normal, magnesium 2.0    Past Medical History:  Diagnosis Date   CHF (congestive heart failure) (HCC)    CSF leak    Diabetes mellitus without complication (HCC)    DM2 (diabetes mellitus, type 2) ([place])    Essential hypertension    Heart attack ([place])    Hyperlipidemia    Morbid obesity ([place])    NICM (nonischemic cardiomyopathy) ([place])    Nonobstructive atherosclerosis of coronary artery    OSA (obstructive sleep apnea)     Past Surgical History:  Procedure Laterality Date   CERVICAL FUSION  2017   CSF leak repair x 2     2014 and 2017   CSF SHUNT  2017    Current Medications: Current Meds  Medication Sig   acetaminophen (TYLENOL) 500 MG tablet Take 500 mg by mouth every 6 (six)  hours as needed.   aspirin 81 MG chewable tablet Chew 81 mg by mouth daily.   carvedilol (COREG) 25 MG tablet Take 1 tablet (25 mg total) by mouth 2 (two) times daily.   citalopram (CELEXA) 20 MG tablet Take 1 tablet (20 mg total) by mouth daily.   empagliflozin (JARDIANCE) 10 MG TABS tablet Take 1 tablet (10 mg) by mouth once daily   gabapentin (NEURONTIN) 300 MG capsule Take 300 mg by mouth 3 (three) times daily.   metFORMIN (GLUCOPHAGE) 1000 MG tablet Take 1,000 mg by mouth 2 (two) times daily with a meal.   omeprazole (PRILOSEC) 20 MG capsule Take 20 mg by mouth daily.   rosuvastatin (CRESTOR) 5 MG tablet Take 1 tablet (5 mg total) by mouth daily.   sacubitril-valsartan (ENTRESTO) 97-103 MG Take 1 tablet by mouth 2 (two) times daily.   spironolactone (ALDACTONE) 25 MG tablet Take 25 mg by mouth daily.   torsemide (DEMADEX) 20 MG tablet Take 1 tablet (20 mg total) by mouth daily as needed (As needed for weight gain greater than 3 pounds overnight  or 5 pounds in one week.).   [DISCONTINUED] carvedilol (COREG) 6.25 MG tablet Take 6.25 mg by mouth 2 (two) times daily with a meal.   [DISCONTINUED] sacubitril-valsartan (ENTRESTO) 24-26 MG Take 1 tablet by mouth 2 (two) times daily.    Allergies:   Cashew nut (anacardium occidentale) skin test and Cashew nut oil   Social History   Socioeconomic History   Marital status: Single    Spouse name: Not on file   Number of children: Not on file   Years of education: Not on file   Highest education level: Not on file  Occupational History   Not on file  Tobacco Use   Smoking status: Never   Smokeless tobacco: Never  Vaping Use   Vaping Use: Never used  Substance and Sexual Activity   Alcohol use: Never   Drug use: Never   Sexual activity: Not on file  Other Topics Concern   Not on file  Social History Narrative   Not on file   Social Determinants of Health   Financial Resource Strain: Not on file  Food Insecurity: Not on file   Transportation Needs: Not on file  Physical Activity: Not on file  Stress: Not on file  Social Connections: Not on file     Family History:  The patient's family history includes Heart failure in her father; Hypertension in her father; Kidney disease in her father.  ROS:   12-point review of systems is negative unless otherwise noted in the HPI.   EKGs/Labs/Other Studies Reviewed:    Studies reviewed were summarized above. The additional studies were reviewed today:  2D echo 01/30/2022: 1. Left ventricular ejection fraction, by estimation, is 40 to 45%. The  left ventricle has mildly decreased function. The left ventricle  demonstrates global hypokinesis. The left ventricular internal cavity size  was mildly to moderately dilated. Left  ventricular diastolic parameters are consistent with Grade II diastolic  dysfunction (pseudonormalization). Elevated left atrial pressure.   2. Right ventricular systolic function is normal. The right ventricular  size is normal. Tricuspid regurgitation signal is inadequate for assessing  PA pressure.   3. The mitral valve is normal in structure. Mild mitral valve  regurgitation. No evidence of mitral stenosis.   4. The aortic valve was not well visualized. Aortic valve regurgitation  is not visualized. No aortic stenosis is present. __________   Coronary CTA 03/01/2022: Noncardiac over read -  1. Near complete resolution of ground-glass opacities in the chest when compared to the recent study from April of 2023. 2. Bandlike changes along the posterior pleural surface likely a mixture of atelectasis and post inflammatory changes also showing improvement since previous imaging. This may reflect developing post inflammatory fibrosis with and is mixed atelectatic changes at the lung bases, suggest attention on follow-up.   Cardiac overread -  Aorta:  Normal size.  No calcifications.  No dissection.   Aortic Valve:  Trileaflet.  No  calcifications.   Coronary Arteries:  Normal coronary origin.  Right dominance.   RCA is a dominant artery that gives rise to PDA and PLA. There is no plaque.   Left main gives rise to LAD and LCX arteries. There is no LM disease.   LAD has no plaque.   LCX is a non-dominant artery that gives rise to a large obtuse marginal branch. There is no plaque.   Other findings:   Normal pulmonary vein drainage into the left atrium.   Normal left atrial appendage without a  thrombus.   Normal size of the pulmonary artery.   IMPRESSION: 1. Normal coronary calcium score of 0. Patient is low risk for coronary events. 2. Normal coronary origin with right dominance. 3. No evidence of CAD. 4. CAD-RADS 0. Consider non-atherosclerotic causes of chest pain. __________   Limited echo 06/21/2022: 1. Left ventricular ejection fraction, by estimation, is 35 to 40%. The  left ventricle has moderately decreased function. The left ventricle  demonstrates global hypokinesis. The left ventricular internal cavity size  was mildly dilated. Left ventricular  diastolic parameters are consistent with Grade I diastolic dysfunction  (impaired relaxation).   2. Right ventricular systolic function is normal. The right ventricular  size is normal. Tricuspid regurgitation signal is inadequate for assessing  PA pressure.   3. The mitral valve is normal in structure. Mild mitral valve  regurgitation. No evidence of mitral stenosis.   4. The aortic valve is normal in structure. Aortic valve regurgitation is  not visualized. No aortic stenosis is present.   5. The inferior vena cava is normal in size with greater than 50%  respiratory variability, suggesting right atrial pressure of 3 mmHg. __________  Cardiac MRI 07/18/2022: FINDINGS: 1. Mildly dilated left ventricular size. Moderately reduced LV systolic function (LVEF = 32%). There is global hypokinesis with no regional wall motion abnormalities.   There  is no late gadolinium enhancement in the left ventricular myocardium.   LVEDV: 276 ml   LVESV: 187 ml   SV: 89 ml   CO: 4.9 L/min   Myocardial mass: 152   2. Normal right ventricular size, thickness and low normal systolic function (RVEF = 47%). There are no regional wall motion abnormalities.   3.  Normal left and right atrial size.   4. Normal size of the aortic root, ascending aorta and pulmonary artery. Normal pulmonic and aortic flow. Qp:Qs = 1.05   5.  Trace MR, Trace TR. no other significant valvular abnormalities.   6.  Normal pericardium.  No pericardial effusion.   IMPRESSION: 1.  Mildly dilated LV, moderately reduced LV function.  LVEF = 32% 2. No evidence for myocardial infiltration, late gadolinium enhancement or scar. 3.  No significant valvular abnormalities. 4.  Normal RV size and function 5.  Findings suggest non-ischemic cardiomyopathy.   EKG:  EKG is not ordered today.    Recent Labs: 01/30/2022: B Natriuretic Peptide 189.7 02/03/2022: Hemoglobin 13.8; Platelets 330 08/14/2022: ALT 16; BUN 12; Creatinine, Ser 0.93; Potassium 4.4; Sodium 138  Recent Lipid Panel    Component Value Date/Time   CHOL 170 08/14/2022 1514   TRIG 171 (H) 08/14/2022 1514   HDL 50 08/14/2022 1514   CHOLHDL 3.4 08/14/2022 1514   LDLCALC 91 08/14/2022 1514    PHYSICAL EXAM:    VS:  BP 118/82   Pulse 98   Ht 5\' 11"  (1.803 m)   Wt 275 lb (124.7 kg)   SpO2 99%   BMI 38.35 kg/m   BMI: Body mass index is 38.35 kg/m.  Physical Exam Vitals reviewed.  Constitutional:      Appearance: She is well-developed.  HENT:     Head: Normocephalic and atraumatic.  Eyes:     General:        Right eye: No discharge.        Left eye: No discharge.  Neck:     Vascular: No JVD.  Cardiovascular:     Rate and Rhythm: Normal rate and regular rhythm.     Pulses:  Posterior tibial pulses are 2+ on the right side and 2+ on the left side.     Heart sounds: Normal heart  sounds, S1 normal and S2 normal. Heart sounds not distant. No midsystolic click and no opening snap. No murmur heard.    No friction rub.  Pulmonary:     Effort: Pulmonary effort is normal. No respiratory distress.     Breath sounds: Normal breath sounds. No decreased breath sounds, wheezing or rales.  Chest:     Chest wall: No tenderness.  Abdominal:     General: There is no distension.  Musculoskeletal:     Cervical back: Normal range of motion.     Right lower leg: No edema.     Left lower leg: No edema.  Skin:    General: Skin is warm and dry.     Nails: There is no clubbing.  Neurological:     Mental Status: She is alert and oriented to person, place, and time.  Psychiatric:        Speech: Speech normal.        Behavior: Behavior normal.        Thought Content: Thought content normal.        Judgment: Judgment normal.     Wt Readings from Last 3 Encounters:  10/19/22 275 lb (124.7 kg)  10/05/22 273 lb 11.2 oz (124.1 kg)  08/14/22 282 lb 6.4 oz (128.1 kg)     ASSESSMENT & PLAN:   HFrEF secondary to NICM: She appears euvolemic and well compensated with NYHA class II symptoms.  There are several medication discrepancies on her medication list, it is unclear where these came from.  I have maintained the patient on carvedilol 25 mg twice daily, Entresto 97/103 mg twice daily, Jardiance 10 mg daily, spironolactone 25 mg daily, and torsemide 20 mg daily.  Her medication list at check in did not reflect some of these doses.  She should continue GDMT as outlined above.  Despite maximal GDMT, her cardiomyopathy has persisted with an EF of 32% on cardiac MRI, without evidence for MI, scar, or LGE.  She does not have a prolonged QRS.  She is adherent to follow-up and medications.  Update limited echo followed by evaluation with the advanced heart failure team in [place].  Nonobstructive CAD: No symptoms suggestive of angina or decompensation.  Recent coronary CTA showed a calcium  score of 0 with no evidence of CAD.  Continue aggressive risk factor modification and primary prevention including aspirin, carvedilol, and rosuvastatin.  No indication for further ischemic testing at this time  HTN: Blood pressure is well-controlled in the office today on recheck.  Continue medical therapy as outlined above.  Abnormal chest CT: Noncardiac over read of coronary CTA showed improving inflammatory changes as well as near complete resolution of groundglass opacities.  Consider follow-up noncontrast CT at next visit.  Class II obesity: Weight loss is encouraged through out of the diet and regular exercise.  We will refill the patient's Ozempic.  IDDM: A1c 9.8 in 06/2022.  Out of Levemir.  Refill x 1.  Further refills per PCP.   Disposition: F/u with Dr. Rockey Situ or an APP in 3 months.   Medication Adjustments/Labs and Tests Ordered: Current medicines are reviewed at length with the patient today.  Concerns regarding medicines are outlined above. Medication changes, Labs and Tests ordered today are summarized above and listed in the Patient Instructions accessible in Encounters.   Signed, Christell Faith, PA-C 10/19/2022 2:04 PM  [facility] [address] [place],  [address] [phone]

## 2022-10-19 ENCOUNTER — Encounter: Payer: Self-pay | Admitting: Physician Assistant

## 2022-10-19 ENCOUNTER — Ambulatory Visit: Payer: Medicare Other | Attending: Physician Assistant | Admitting: Physician Assistant

## 2022-10-19 VITALS — BP 118/82 | HR 98 | Ht 71.0 in | Wt 275.0 lb

## 2022-10-19 DIAGNOSIS — Z794 Long term (current) use of insulin: Secondary | ICD-10-CM | POA: Diagnosis not present

## 2022-10-19 DIAGNOSIS — R918 Other nonspecific abnormal finding of lung field: Secondary | ICD-10-CM | POA: Diagnosis not present

## 2022-10-19 DIAGNOSIS — I2510 Atherosclerotic heart disease of native coronary artery without angina pectoris: Secondary | ICD-10-CM

## 2022-10-19 DIAGNOSIS — I10 Essential (primary) hypertension: Secondary | ICD-10-CM

## 2022-10-19 DIAGNOSIS — I428 Other cardiomyopathies: Secondary | ICD-10-CM | POA: Diagnosis not present

## 2022-10-19 DIAGNOSIS — E669 Obesity, unspecified: Secondary | ICD-10-CM

## 2022-10-19 DIAGNOSIS — Z6838 Body mass index (BMI) 38.0-38.9, adult: Secondary | ICD-10-CM

## 2022-10-19 DIAGNOSIS — E119 Type 2 diabetes mellitus without complications: Secondary | ICD-10-CM | POA: Diagnosis not present

## 2022-10-19 DIAGNOSIS — I5022 Chronic systolic (congestive) heart failure: Secondary | ICD-10-CM

## 2022-10-19 MED ORDER — CARVEDILOL 25 MG PO TABS: 25.0000 mg | ORAL_TABLET | Freq: Two times a day (BID) | ORAL | 3 refills | Status: DC

## 2022-10-19 MED ORDER — OZEMPIC (0.25 OR 0.5 MG/DOSE) 2 MG/1.5ML ~~LOC~~ SOPN: 0.5000 mg | PEN_INJECTOR | SUBCUTANEOUS | 0 refills | Status: DC

## 2022-10-19 MED ORDER — INSULIN DETEMIR 100 UNIT/ML ~~LOC~~ SOLN: 50.0000 [IU] | Freq: Every day | SUBCUTANEOUS | 0 refills | Status: DC

## 2022-10-19 NOTE — Patient Instructions (Addendum)
Medication Instructions:  Your physician has recommended you make the following change in your medication:   START Ozempic once a week.   *If you need a refill on your cardiac medications before your next appointment, please call your pharmacy*   Lab Work: None  If you have labs (blood work) drawn today and your tests are completely normal, you will receive your results only by: [place] (if you have MyChart) OR A paper copy in the mail If you have any lab test that is abnormal or we need to change your treatment, we will call you to review the results.   Testing/Procedures: Your physician has requested that you have an echocardiogram. Echocardiography is a painless test that uses sound waves to create images of your heart. It provides your doctor with information about the size and shape of your heart and how well your heart's chambers and valves are working. This procedure takes approximately one hour. There are no restrictions for this procedure. Please do NOT wear cologne, perfume, aftershave, or lotions (deodorant is allowed). Please arrive 15 minutes prior to your appointment time.    Follow-Up: At [facility], you and your health needs are our priority.  As part of our continuing mission to provide you with exceptional heart care, we have created designated Provider Care Teams.  These Care Teams include your primary Cardiologist (physician) and Advanced Practice Providers (APPs -  Physician Assistants and Nurse Practitioners) who all work together to provide you with the care you need, when you need it.   Your next appointment:   3 month(s)  The format for your next appointment:   In Person  Provider:   Ida Rogue, MD or Christell Faith, PA-C    Other Instructions Follow up after echocardiogram with Dr. Glori Bickers. Their number is [phone]   Important Information About [place]

## 2022-10-26 ENCOUNTER — Other Ambulatory Visit: Payer: Medicare Other

## 2022-11-02 ENCOUNTER — Telehealth: Payer: Self-pay | Admitting: *Deleted

## 2022-11-02 ENCOUNTER — Ambulatory Visit: Payer: Medicare Other | Attending: Physician Assistant

## 2022-11-02 DIAGNOSIS — I428 Other cardiomyopathies: Secondary | ICD-10-CM | POA: Diagnosis not present

## 2022-11-02 DIAGNOSIS — I5022 Chronic systolic (congestive) heart failure: Secondary | ICD-10-CM | POA: Diagnosis not present

## 2022-11-02 LAB — ECHOCARDIOGRAM LIMITED
Area-P 1/2: 5.93 cm2
S' Lateral: 4.2 cm

## 2022-11-02 NOTE — Telephone Encounter (Signed)
Sending over for precert.

## 2022-11-05 ENCOUNTER — Telehealth: Payer: Self-pay | Admitting: *Deleted

## 2022-11-05 NOTE — Telephone Encounter (Signed)
Spoke with Emily Hinton at [facility] to initiate a prior authorization for Emily Hinton. [place] has been approved through 10/15/2023. Approval #[id number]

## 2022-11-05 NOTE — Telephone Encounter (Signed)
Left vm for return call to schedule with Dr.Bensimhon next week at [facility]

## 2022-11-06 ENCOUNTER — Telehealth: Payer: Self-pay | Admitting: *Deleted

## 2022-11-06 NOTE — Telephone Encounter (Signed)
-----  Message from Rise Mu, PA-C sent at 11/02/2022  5:52 PM EST ----- Echo showed improvement in LV systolic function with an EF of 40 to 45% with a moderately leaky mitral valve.  When compared to echo from 06/2022, pump function has improved slightly and mitral valve is a little more leaky.  She is already on maximally tolerated GDMT including carvedilol 25 mg twice daily, Jardiance 10 mg, Entresto 97/103 mg twice daily, and spironolactone 25 mg daily with torsemide 20 mg daily as needed.  We referred her to the advanced heart failure clinic in [place] at her last visit, though I do not see an appointment was made, please ensure she gets an appointment with their group.

## 2022-11-06 NOTE — Telephone Encounter (Signed)
Left voicemail message to call back for review of results and recommendations.  

## 2022-11-06 NOTE — Progress Notes (Signed)
Spoke with Nira Conn RN in advanced heart failure clinic and she will reach out to patient to switch appointment over to Dr. Haroldine Laws.

## 2022-11-08 ENCOUNTER — Ambulatory Visit
Admission: RE | Admit: 2022-11-08 | Discharge: 2022-11-08 | Disposition: A | Discharge status: Home / Self Care | Payer: Medicare Other | Source: Ambulatory Visit | Attending: Nurse Practitioner | Admitting: Nurse Practitioner

## 2022-11-08 DIAGNOSIS — Z1231 Encounter for screening mammogram for malignant neoplasm of breast: Secondary | ICD-10-CM | POA: Insufficient documentation

## 2022-11-15 NOTE — Progress Notes (Signed)
Spoke with Amber in the [place] clinic to request appointment be changed from Darylene Price NP to Dr. Glori Bickers. She will put in recall and get her scheduled with him as soon as next schedule comes out.

## 2022-11-21 ENCOUNTER — Inpatient Hospital Stay
Admission: RE | Admit: 2022-11-21 | Discharge: 2022-11-21 | Disposition: A | Discharge status: Home / Self Care | Payer: Self-pay | Source: Ambulatory Visit | Attending: *Deleted | Admitting: *Deleted

## 2022-11-21 ENCOUNTER — Other Ambulatory Visit: Payer: Self-pay | Admitting: *Deleted

## 2022-11-21 DIAGNOSIS — Z1231 Encounter for screening mammogram for malignant neoplasm of breast: Secondary | ICD-10-CM

## 2022-11-21 NOTE — Progress Notes (Signed)
Please let patient know her Mammogram did not show any evidence of a malignancy.  The recommendation is to repeat the Mammogram in 1 year.  

## 2022-11-30 ENCOUNTER — Encounter: Payer: Self-pay | Admitting: Cardiology

## 2022-11-30 ENCOUNTER — Other Ambulatory Visit
Admission: RE | Admit: 2022-11-30 | Discharge: 2022-11-30 | Disposition: A | Discharge status: Home / Self Care | Payer: Medicare Other | Source: Ambulatory Visit | Attending: Cardiology | Admitting: Cardiology

## 2022-11-30 ENCOUNTER — Ambulatory Visit (HOSPITAL_BASED_OUTPATIENT_CLINIC_OR_DEPARTMENT_OTHER): Payer: Medicare Other | Admitting: Cardiology

## 2022-11-30 VITALS — BP 110/64 | HR 76 | Wt 272.2 lb

## 2022-11-30 DIAGNOSIS — I5022 Chronic systolic (congestive) heart failure: Secondary | ICD-10-CM | POA: Diagnosis not present

## 2022-11-30 DIAGNOSIS — I739 Peripheral vascular disease, unspecified: Secondary | ICD-10-CM | POA: Diagnosis not present

## 2022-11-30 LAB — BASIC METABOLIC PANEL
Anion gap: 11 (ref 5–15)
BUN: 13 mg/dL (ref 6–20)
CO2: 31 mmol/L (ref 22–32)
Calcium: 9.2 mg/dL (ref 8.9–10.3)
Chloride: 97 mmol/L — ABNORMAL LOW (ref 98–111)
Creatinine, Ser: 1.11 mg/dL — ABNORMAL HIGH (ref 0.44–1.00)
GFR, Estimated: 57 mL/min — ABNORMAL LOW (ref >60)
Glucose, Bld: 157 mg/dL — ABNORMAL HIGH (ref 70–99)
Potassium: 3.7 mmol/L (ref 3.5–5.1)
Sodium: 139 mmol/L (ref 135–145)

## 2022-11-30 LAB — BRAIN NATRIURETIC PEPTIDE: B Natriuretic Peptide: 37.7 pg/mL (ref 0.0–100.0)

## 2022-11-30 MED ORDER — TORSEMIDE 20 MG PO TABS: ORAL_TABLET | ORAL | 4 refills | Status: DC

## 2022-11-30 NOTE — Patient Instructions (Addendum)
Medication Changes:  Take Torsemide 40 mg (two tablets) Daily for two days. Then take Torsemide 20 mg (one tablet)  daily.   Lab Work:  Labs done today, your results will be available in MyChart, we will contact you for abnormal readings.   Testing/Procedures:  Your physician has recommended that you have a cardiopulmonary stress test (CPX). CPX testing is a non-invasive measurement of heart and lung function. It replaces a traditional treadmill stress test. This type of test provides a tremendous amount of information that relates not only to your present condition but also for future outcomes. This test combines measurements of you ventilation, respiratory gas exchange in the lungs, electrocardiogram (EKG), blood pressure and physical response before, during, and following an exercise protocol.   Your physician has requested that you have an ankle brachial index (ABI). During this test an ultrasound and blood pressure cuff are used to evaluate the arteries that supply the arms and legs with blood. Allow thirty minutes for this exam. There are no restrictions or special instructions.  [facility] care will call you to schedule this appointment.   Special Instructions // Education:  Do the following things EVERYDAY: Weigh yourself in the morning before breakfast. Write it down and keep it in a log. Take your medicines as prescribed Eat low salt foods--Limit salt (sodium) to 2000 mg per day.  Stay as active as you can everyday Limit all fluids for the day to less than 2 liters   Follow-Up in: in three months with Dr. Aundra Dubin. Please call our clinic in April to schedule your three month appointment.     If you have any questions or concerns before your next appointment please send [place] a message through [place] or call our office at [phone] Monday-Friday 8 am-5 pm.   If you have an urgent need after hours on the weekend please call your Primary Cardiologist or the [facility]  in [place] at [phone].

## 2022-11-30 NOTE — Progress Notes (Signed)
   11/30/22 1216  ReDS Vest / Clip  Station Marker D  Ruler Value 35  ReDS Value Range 36 - 40  ReDS Actual Value 36

## 2022-12-02 NOTE — Progress Notes (Signed)
PCP: Jon Billings, NP Cardiology: Dr. Rockey Situ HF Cardiology: Dr. Aundra Dubin  62 y.o. with history of OSA, type 2 diabetes, CSF leak requiring VP shunt, and nonischemic cardiomyopathy was referred by Dr. Rockey Situ for CHF clinic evaluation.  Patient's cardiomyopathy was diagnosed in 2018 in [place], cath at that time showed no significant CAD.  She subsequently relocated to [facility].  She was admitted to [facility] in 4/23 with PNA and CHF, echo showed EF 40-45%.  Coronary CTA done in 5/23 showed no CAD, calcium score 0.  Cardiac MRI in 10/23 showed EF 32%, no LGE, RV EF 47%.  Most recent echo in 1/24 showed EF 40-45%, mild LV dilation, normal RV, moderate MR, IVC normal.  Patient does not have a strong family history of cardiomyopathy, her father had CHF but developed after he had renal failure requiring HD.  No drinking or drugs.    Patient reports dyspnea walking longer distances.  She has chronic 3 pillow orthopnea.  She is short of breath with stairs.  She has prominent fatigue.  No lightheadedness or syncope.  No chest pain.  No palpitations.  She is in NSR.  She also reports left leg pain with walking.   REDS clip 36%  Labs (10/23): LDL 91, K 4.4, creatinine 0.93  ECG (personally reviewed): NSR, LAFB  PMH: 1. OSA: CPAP 2. Type 2 diabetes 3. CSF leak: Repairs in 2014 and 2017, VP shunt placed.  4. H/o spinal fusion 5. Chronic systolic CHF: nonischemic cardiomyopathy.  Diagnosed in [place] around 2018.  - Echo (2018): EF 45%.  - LHC (2018): No significant CAD.  - Echo (8/19): EF 45% - Echo (7/22): EF 55% - Echo (4/23): EF 40-45%, normal RV.  - Coronary CTA (5/23): Coronary artery calcium score 0, no CAD.  - Echo (9/23): EF 35-40%, normal RV - cMRI (10/23): EF 32%, no LGE, RV EF 47%.  - Echo (1/24): EF 40-45%, mild LV dilation, normal RV, moderate MR, IVC normal.   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  No smoking, ETOH, drugs.   FH: Father with ESRD and CHF, siblings with no cardiac issues.   ROS:  All systems reviewed and negative except as per HPI.   Current Outpatient Medications  Medication Sig Dispense Refill   acetaminophen (TYLENOL) 500 MG tablet Take 500 mg by mouth every 6 (six) hours as needed.     aspirin 81 MG chewable tablet Chew 81 mg by mouth daily.     carvedilol (COREG) 25 MG tablet Take 1 tablet (25 mg total) by mouth 2 (two) times daily. 180 tablet 3   citalopram (CELEXA) 20 MG tablet Take 1 tablet (20 mg total) by mouth daily. 90 tablet 0   empagliflozin (JARDIANCE) 10 MG TABS tablet Take 1 tablet (10 mg) by mouth once daily     gabapentin (NEURONTIN) 300 MG capsule Take 300 mg by mouth 3 (three) times daily.     insulin detemir (LEVEMIR) 100 UNIT/ML injection Inject 0.5 mLs (50 Units total) into the skin at bedtime. 10 mL 0   metFORMIN (GLUCOPHAGE) 1000 MG tablet Take 1,000 mg by mouth 2 (two) times daily with a meal.     omeprazole (PRILOSEC) 20 MG capsule Take 20 mg by mouth daily.     rosuvastatin (CRESTOR) 5 MG tablet Take 1 tablet (5 mg total) by mouth daily. 90 tablet 1   sacubitril-valsartan (ENTRESTO) 97-103 MG Take 1 tablet by mouth 2 (two) times daily.     Semaglutide,0.25 or 0.5MG/DOS, (  OZEMPIC, 0.25 OR 0.5 MG/DOSE,) 2 MG/1.5ML SOPN Inject 0.5 mg into the skin once a week. 4 mL 0   spironolactone (ALDACTONE) 25 MG tablet Take 25 mg by mouth daily.     insulin regular (NOVOLIN R) 100 units/mL injection Inject 6 Units into the skin in the morning, at noon, and at bedtime.     torsemide (DEMADEX) 20 MG tablet Take 2 tablets (40 mg total) by mouth daily for 2 days, THEN 1 tablet (20 mg total) daily. 30 tablet 4   No current facility-administered medications for this visit.   BP 110/64   Pulse 76   Wt 272 lb 3.2 oz (123.5 kg)   SpO2 97%   BMI 37.96 kg/m  General: NAD Neck: No JVD, no thyromegaly or thyroid nodule.  Lungs: Clear to auscultation bilaterally with normal respiratory effort. CV: Nondisplaced PMI.  Heart regular S1/S2, no S3/S4, no murmur.   No peripheral edema.  No carotid bruit.  Difficult to palpate pedal pulses.  Abdomen: Soft, nontender, no hepatosplenomegaly, no distention.  Skin: Intact without lesions or rashes.  Neurologic: Alert and oriented x 3.  Psych: Normal affect. Extremities: No clubbing or cyanosis.  HEENT: Normal.   Assessment/Plan: 1. Chronic systolic CHF: Nonischemic cardiomyopathy.  Cath in 2018 and coronary CTA in 5/23 showed no coronary disease.  Cardiac MRI in 10/23 showed  EF 32%, no LGE, RV EF 47%, and last echo in 1/24 showed EF 40-45%, mild LV dilation, normal RV, moderate MR, IVC normal.  She does not have a history of ETOH or drug abuse.  She does not have a strong family history of cardiomyopathy.  Dyspnea seems somewhat out of proportion to exam (NYHA class III), but REDs clip is noted to be mildly elevated at 36%.  She is on a good regimen of medications.  - Increase torsemide to 40 mg daily x 2 days then back to 20 mg daily.  She will take torsemide 20 daily rather than prn.  BMET/BNP today.  - Continue Coreg 25 mg bid.  - Continue empagliflozin 10 daily - Continue Entresto 97/103 bid - Continue spironolactone 25 daily.  - Consider Bidil in future, especially if EF declines.  - I will arrange for CPX give dyspnea somewhat out of proportion to exam.  2. Left leg pain: With ambulation. Difficult to palpate pedal pulses.  This may be sciatica, but think it would be reasonable to check ABIs.  3. OSA: Use CPAP.   Followup in 3 months.   Loralie Champagne 12/02/2022

## 2022-12-06 NOTE — Telephone Encounter (Signed)
Reviewed results and recommendations with patient. She did see CHF clinic Dr. Aundra Dubin back on 2/16. She verbalized understanding of results with no further questions at this time.

## 2022-12-06 NOTE — Progress Notes (Signed)
BP 110/74   Pulse 78   Temp 98.2 F (36.8 C) (Oral)   Wt 274 lb 6.4 oz (124.5 kg)   SpO2 98%   BMI 38.27 kg/m    Subjective:    Patient ID: Emily Hinton, female    DOB: Aug 05, 1961, 62 y.o.   MRN: [id number]  HPI: Emily Hinton- Danielle Dess is a 62 y.o. female  Chief Complaint  Patient presents with   Diabetes   Hyperlipidemia   Hypertension   HYPERTENSION / HYPERLIPIDEMIA/HF Not weighing daily.  Torsemide was changed to daily.  Patient is taking Jardiance. Satisfied with current treatment? yes Duration of hypertension: years BP monitoring frequency: daily BP range: 120/70 BP medication side effects: no Past BP meds: carvedilol and valsartan Duration of hyperlipidemia: years Cholesterol medication side effects: no Cholesterol supplements: none Past cholesterol medications: rosuvastatin (crestor) Medication compliance: excellent compliance Aspirin: no Recent stressors: no Recurrent headaches: no Visual changes: no Palpitations: no Dyspnea: no Chest pain: no Lower extremity edema: no Dizzy/lightheaded: no  DIABETES Patient states she is taking Ozempic 0.'25mg'$  and tolerating it well.   Hypoglycemic episodes:no Polydipsia/polyuria: no Visual disturbance: no Chest pain: no Paresthesias: no Glucose Monitoring: yes  Accucheck frequency: Daily  Fasting glucose: 130-160  Post prandial:  Evening:  Before meals: Taking Insulin?: yes  Long acting insulin: levemir 10u  Short acting insulin: Novolin 6u TID- has been out of medication. Blood Pressure Monitoring: daily Retinal Examination: Up to Date Foot Exam:  up to date Diabetic Education: Not Completed Pneumovax: Up to Date Influenza: Up to Date Aspirin: no  VP SHUNT Has seen Neurology.  Hasn't seen Neurology recently.   DEPRESSION Patient feels like the Celexa is helping her mood.  Doing well with the medication.  She feels like this dose is enough for her and doesn't want to increase.    Relevant past medical, surgical, family and social history reviewed and updated as indicated. Interim medical history since our last visit reviewed. Allergies and medications reviewed and updated.  Review of Systems  Eyes:  Negative for visual disturbance.  Respiratory:  Negative for cough, chest tightness and shortness of breath.   Cardiovascular:  Negative for chest pain, palpitations and leg swelling.  Endocrine: Negative for polydipsia and polyuria.  Neurological:  Negative for dizziness, numbness and headaches.    Per HPI unless specifically indicated above     Objective:    BP 110/74   Pulse 78   Temp 98.2 F (36.8 C) (Oral)   Wt 274 lb 6.4 oz (124.5 kg)   SpO2 98%   BMI 38.27 kg/m   Wt Readings from Last 3 Encounters:  12/07/22 274 lb 6.4 oz (124.5 kg)  11/30/22 272 lb 3.2 oz (123.5 kg)  10/19/22 275 lb (124.7 kg)    Physical Exam Vitals and nursing note reviewed.  Constitutional:      General: She is not in acute distress.    Appearance: Normal appearance. She is obese. She is not ill-appearing, toxic-appearing or diaphoretic.  HENT:     Head: Normocephalic.     Right Ear: External ear normal.     Left Ear: External ear normal.     Nose: Nose normal.     Mouth/Throat:     Mouth: Mucous membranes are moist.     Pharynx: Oropharynx is clear.  Eyes:     General:        Right eye: No discharge.        Left eye: No discharge.  Extraocular Movements: Extraocular movements intact.     Conjunctiva/sclera: Conjunctivae normal.     Pupils: Pupils are equal, round, and reactive to light.  Cardiovascular:     Rate and Rhythm: Normal rate and regular rhythm.     Heart sounds: No murmur heard. Pulmonary:     Effort: Pulmonary effort is normal. No respiratory distress.     Breath sounds: Normal breath sounds. No wheezing or rales.  Musculoskeletal:     Cervical back: Normal range of motion and neck supple.  Skin:    General: Skin is warm and dry.      Capillary Refill: Capillary refill takes less than 2 seconds.  Neurological:     General: No focal deficit present.     Mental Status: She is alert and oriented to person, place, and time. Mental status is at baseline.  Psychiatric:        Mood and Affect: Mood normal.        Behavior: Behavior normal.        Thought Content: Thought content normal.        Judgment: Judgment normal.     Results for orders placed or performed in visit on 0000000  Basic Metabolic Panel (BMET)  Result Value Ref Range   Sodium 139 135 - 145 mmol/L   Potassium 3.7 3.5 - 5.1 mmol/L   Chloride 97 (L) 98 - 111 mmol/L   CO2 31 22 - 32 mmol/L   Glucose, Bld 157 (H) 70 - 99 mg/dL   BUN 13 6 - 20 mg/dL   Creatinine, Ser 1.11 (H) 0.44 - 1.00 mg/dL   Calcium 9.2 8.9 - 10.3 mg/dL   GFR, Estimated 57 (L) >60 mL/min   Anion gap 11 5 - 15  B Nat Peptide  Result Value Ref Range   B Natriuretic Peptide 37.7 0.0 - 100.0 pg/mL      Assessment & Plan:   Problem List Items Addressed This Visit       Cardiovascular and Mediastinum   Essential hypertension - Primary    Chronic.  Controlled.  Continue with current medication regimen of Carvedilol and Valsartan (Entresto).  Labs ordered today.  Return to clinic in 3 months for reevaluation.  Call sooner if concerns arise.        Relevant Medications   rosuvastatin (CRESTOR) 10 MG tablet   Other Relevant Orders   Comp Met (CMET)   HFrEF (heart failure with reduced ejection fraction) (HCC)    Chronic.  Followed by HF clinic.  Echo report from 06/21/22 reviewed and showed an EF of 35-40% with mild MR.  Patient is not weighing daily.  Encouraged her to weigh herself.   - Reminded to call for an overnight weight gain of >2 pounds or a weekly weight gain of >5 pounds - not adding salt to food and read food labels. Reviewed the importance of keeping daily sodium intake to '2000mg'$  daily. - Avoid Ibuprofen products.       Relevant Medications   rosuvastatin (CRESTOR)  10 MG tablet     Endocrine   Diabetes mellitus (HCC)    Chronic. Not well controlled.  A1c checked on 9/14 showed 9.8.  On insulin Levemir 10u and has been out of her Novolin 6u, Jardiance, ozempic 0.'25mg'$  (has side effects from 0.'5mg'$ ), and Metformin.   Patient has had trouble tolerating Ozempic at higher doses.  Will follow up at next visit to check sugars.   Will follow up in 3 month for reevaluation.  Call [facility] if concerns arise.         Relevant Medications   rosuvastatin (CRESTOR) 10 MG tablet   Other Relevant Orders   HgB A1c   Microalbumin, Urine Waived     Other   Morbid obesity (HCC)    Recommended eating smaller high protein, low fat meals more frequently and exercising 30 mins a day 5 times a week with a goal of 10-15lb weight loss in the next 3 months.       Hypertriglyceridemia    Labs ordered at visit today.  Will make recommendations based on lab results.  Crestor increased to '10mg'$ .  Follow up in 3 months.  Call sooner if concerns arise.       Relevant Medications   rosuvastatin (CRESTOR) 10 MG tablet   Other Relevant Orders   Lipid Profile   Depression, recurrent ([place])    Chronic. Improved.  Doing well with Celexa '20mg'$ .  Follow up in 3 months.  Call sooner if concerns arise.       Anxiety    Chronic. Improved.  Doing well with Celexa '20mg'$ .  Follow up in 3 months.  Call sooner if concerns arise.         Follow up plan: Return in about 3 months (around 03/07/2023) for HTN, HLD, DM2 FU.

## 2022-12-07 ENCOUNTER — Ambulatory Visit (INDEPENDENT_AMBULATORY_CARE_PROVIDER_SITE_OTHER): Payer: Medicare Other | Admitting: Nurse Practitioner

## 2022-12-07 ENCOUNTER — Encounter: Payer: Self-pay | Admitting: Nurse Practitioner

## 2022-12-07 VITALS — BP 110/74 | HR 78 | Temp 98.2°F | Wt 274.4 lb

## 2022-12-07 DIAGNOSIS — I5020 Unspecified systolic (congestive) heart failure: Secondary | ICD-10-CM | POA: Diagnosis not present

## 2022-12-07 DIAGNOSIS — I10 Essential (primary) hypertension: Secondary | ICD-10-CM

## 2022-12-07 DIAGNOSIS — F419 Anxiety disorder, unspecified: Secondary | ICD-10-CM

## 2022-12-07 DIAGNOSIS — Z794 Long term (current) use of insulin: Secondary | ICD-10-CM | POA: Diagnosis not present

## 2022-12-07 DIAGNOSIS — E781 Pure hyperglyceridemia: Secondary | ICD-10-CM

## 2022-12-07 DIAGNOSIS — F339 Major depressive disorder, recurrent, unspecified: Secondary | ICD-10-CM

## 2022-12-07 DIAGNOSIS — E1169 Type 2 diabetes mellitus with other specified complication: Secondary | ICD-10-CM | POA: Diagnosis not present

## 2022-12-07 DIAGNOSIS — Z982 Presence of cerebrospinal fluid drainage device: Secondary | ICD-10-CM

## 2022-12-07 LAB — MICROALBUMIN, URINE WAIVED
Creatinine, Urine Waived: 10 mg/dL (ref 10–300)
Microalb, Ur Waived: 10 mg/L (ref 0–19)

## 2022-12-07 MED ORDER — ROSUVASTATIN CALCIUM 10 MG PO TABS: 10.0000 mg | ORAL_TABLET | Freq: Every day | ORAL | 1 refills | Status: DC

## 2022-12-07 NOTE — Assessment & Plan Note (Signed)
Recommended eating smaller high protein, low fat meals more frequently and exercising 30 mins a day 5 times a week with a goal of 10-15lb weight loss in the next 3 months.  

## 2022-12-07 NOTE — Assessment & Plan Note (Signed)
Chronic.  Controlled.  Continue with current medication regimen of Carvedilol and Valsartan (Entresto).  Labs ordered today.  Return to clinic in 3 months for reevaluation.  Call sooner if concerns arise.

## 2022-12-07 NOTE — Assessment & Plan Note (Signed)
Chronic. Improved.  Doing well with Celexa '20mg'$ .  Follow up in 3 months.  Call sooner if concerns arise.

## 2022-12-07 NOTE — Assessment & Plan Note (Signed)
Chronic. Not well controlled.  A1c checked on 9/14 showed 9.8.  On insulin Levemir 10u and has been out of her Novolin 6u, Jardiance, ozempic 0.'25mg'$  (has side effects from 0.'5mg'$ ), and Metformin.   Patient has had trouble tolerating Ozempic at higher doses.  Will follow up at next visit to check sugars.   Will follow up in 3 month for reevaluation.  Call [facility] if concerns arise.

## 2022-12-07 NOTE — Assessment & Plan Note (Signed)
Chronic.  Followed by HF clinic.  Echo report from 06/21/22 reviewed and showed an EF of 35-40% with mild MR.  Patient is not weighing daily.  Encouraged her to weigh herself.   - Reminded to call for an overnight weight gain of >2 pounds or a weekly weight gain of >5 pounds - not adding salt to food and read food labels. Reviewed the importance of keeping daily sodium intake to '2000mg'$  daily. - Avoid Ibuprofen products.

## 2022-12-07 NOTE — Assessment & Plan Note (Signed)
Labs ordered at visit today.  Will make recommendations based on lab results.  Crestor increased to '10mg'$ .  Follow up in 3 months.  Call sooner if concerns arise.

## 2022-12-08 LAB — COMPREHENSIVE METABOLIC PANEL
ALT: 10 IU/L (ref 0–32)
AST: 9 IU/L (ref 0–40)
Albumin/Globulin Ratio: 1.4 (ref 1.2–2.2)
Albumin: 4.3 g/dL (ref 3.8–4.9)
Alkaline Phosphatase: 75 IU/L (ref 44–121)
BUN/Creatinine Ratio: 10 — ABNORMAL LOW (ref 12–28)
BUN: 10 mg/dL (ref 8–27)
Bilirubin Total: 0.3 mg/dL (ref 0.0–1.2)
CO2: 25 mmol/L (ref 20–29)
Calcium: 9.4 mg/dL (ref 8.7–10.3)
Chloride: 100 mmol/L (ref 96–106)
Creatinine, Ser: 0.98 mg/dL (ref 0.57–1.00)
Globulin, Total: 3 g/dL (ref 1.5–4.5)
Glucose: 133 mg/dL — ABNORMAL HIGH (ref 70–99)
Potassium: 4 mmol/L (ref 3.5–5.2)
Sodium: 142 mmol/L (ref 134–144)
Total Protein: 7.3 g/dL (ref 6.0–8.5)
eGFR: 66 mL/min/{1.73_m2} (ref >59)

## 2022-12-08 LAB — LIPID PANEL
Chol/HDL Ratio: 2.3 ratio (ref 0.0–4.4)
Cholesterol, Total: 117 mg/dL (ref 100–199)
HDL: 50 mg/dL (ref >39)
LDL Chol Calc (NIH): 41 mg/dL (ref 0–99)
Triglycerides: 159 mg/dL — ABNORMAL HIGH (ref 0–149)
VLDL Cholesterol Cal: 26 mg/dL (ref 5–40)

## 2022-12-08 LAB — HEMOGLOBIN A1C
Est. average glucose Bld gHb Est-mCnc: 243 mg/dL
Hgb A1c MFr Bld: 10.1 % — ABNORMAL HIGH (ref 4.8–5.6)

## 2022-12-10 MED ORDER — INSULIN REGULAR HUMAN 100 UNIT/ML IJ SOLN: 6.0000 [IU] | Freq: Three times a day (TID) | INTRAMUSCULAR | 1 refills | Status: DC

## 2022-12-10 NOTE — Addendum Note (Signed)
Addended by: Jon Billings on: 12/10/2022 09:09 AM   Modules accepted: Orders

## 2022-12-10 NOTE — Progress Notes (Signed)
Called and scheduled pt's month follow up for A1C, which is March 22 @ 10:20 am.

## 2022-12-10 NOTE — Progress Notes (Signed)
Please let patient know that her lab work shows that her A1c increased to 10.1%.   I recommend she increase her Insulin to 30u nightly.  I have also sent in her short acting insulin to restart at 6u TID with meals.  I would like her to keep track of her sugars and return in 1 month with a log so we can adjust her Insulin.

## 2022-12-12 ENCOUNTER — Telehealth: Payer: Self-pay | Admitting: Nurse Practitioner

## 2022-12-12 NOTE — Telephone Encounter (Signed)
Copied from [place] [id number]. Topic: Medicare AWV >> Dec 12, 2022 10:22 AM Devoria Glassing wrote: Reason for CRM: Called patient to schedule Medicare Annual Wellness Visit (AWV). Left message for patient to call back and schedule Medicare Annual Wellness Visit (AWV).  Last date of AWV: NONE  Please schedule an appointment at any time with Kirke Shaggy, [place]    If any questions, please contact me.  Thank you ,  Sherol Dade; [place] Direct Dial: [phone]

## 2022-12-13 ENCOUNTER — Other Ambulatory Visit (HOSPITAL_COMMUNITY): Payer: Self-pay

## 2022-12-13 DIAGNOSIS — I5022 Chronic systolic (congestive) heart failure: Secondary | ICD-10-CM

## 2022-12-25 ENCOUNTER — Encounter (HOSPITAL_COMMUNITY): Payer: Medicare Other

## 2022-12-30 ENCOUNTER — Other Ambulatory Visit: Payer: Self-pay | Admitting: Nurse Practitioner

## 2022-12-31 ENCOUNTER — Telehealth: Payer: Self-pay | Admitting: Nurse Practitioner

## 2022-12-31 NOTE — Telephone Encounter (Signed)
Contacted [place] to schedule their annual wellness visit. Appointment made for 01/28/2023.  Sherol Dade; Care Guide [facility] Direct Dial: [phone]

## 2023-01-01 ENCOUNTER — Telehealth: Payer: Self-pay

## 2023-01-01 NOTE — Telephone Encounter (Signed)
Patient and provider notified of upcoming change in availability of Levemir.  Recommendation sent to provider based on insurance preference.

## 2023-01-01 NOTE — Telephone Encounter (Signed)
Requested Prescriptions  Pending Prescriptions Disp Refills   citalopram (CELEXA) 20 MG tablet [Pharmacy Med Name: CITALOPRAM 20MG  TABLETS] 90 tablet 1    Sig: TAKE 1 TABLET(20 MG) BY MOUTH DAILY     Psychiatry:  Antidepressants - SSRI Passed - 12/30/2022  2:16 PM      Passed - Completed PHQ-2 or PHQ-9 in the last 360 days      Passed - Valid encounter within last 6 months    Recent Outpatient Visits           3 weeks ago Essential hypertension   [place], NP   2 months ago Upper respiratory tract infection, unspecified type   [place], Karen, NP   4 months ago HFrEF (heart failure with reduced ejection fraction) [facility])   [place] Jon Billings, NP   6 months ago Type 2 diabetes mellitus with other specified complication, with long-term current use of insulin [facility])   [place] Jon Billings, NP       Future Appointments             In 3 days Jon Billings, NP [place], [place]   In 2 weeks Dunn, Areta Haber, PA-C [place] at [place]   In 2 months Jon Billings, NP [place], [place]

## 2023-01-03 NOTE — Progress Notes (Signed)
BP 97/68   Pulse 88   Temp 98 F (36.7 C) (Oral)   Wt 274 lb 14.4 oz (124.7 kg)   SpO2 100%   BMI 38.34 kg/m    Subjective:    Patient ID: Emily Hinton, female    DOB: 1961-02-23, 62 y.o.   MRN: [id number]  HPI: Emily Hinton is a 62 y.o. female  Chief Complaint  Patient presents with   Diabetes   DIABETES Hypoglycemic episodes:no Polydipsia/polyuria: no Visual disturbance: no Chest pain: no Paresthesias: no Glucose Monitoring: yes  Accucheck frequency: Daily  Fasting glucose: 115-130  Post prandial:  Evening:  Before meals: Taking Insulin?: yes  Long acting insulin: Levemir 30u   Short acting insulin: Levemir will no longer be covered by insurance.  Will change to lantus which is preferred.  Relevant past medical, surgical, family and social history reviewed and updated as indicated. Interim medical history since our last visit reviewed. Allergies and medications reviewed and updated.  Review of Systems  Eyes:  Negative for visual disturbance.  Cardiovascular:  Negative for chest pain.  Endocrine: Negative for polydipsia and polyuria.  Neurological:  Negative for numbness.    Per HPI unless specifically indicated above     Objective:    BP 97/68   Pulse 88   Temp 98 F (36.7 C) (Oral)   Wt 274 lb 14.4 oz (124.7 kg)   SpO2 100%   BMI 38.34 kg/m   Wt Readings from Last 3 Encounters:  01/04/23 274 lb 14.4 oz (124.7 kg)  12/07/22 274 lb 6.4 oz (124.5 kg)  11/30/22 272 lb 3.2 oz (123.5 kg)    Physical Exam Vitals and nursing note reviewed.  Constitutional:      General: She is not in acute distress.    Appearance: Normal appearance. She is obese. She is not ill-appearing, toxic-appearing or diaphoretic.  HENT:     Head: Normocephalic.     Right Ear: External ear normal.     Left Ear: External ear normal.     Nose: Nose normal.     Mouth/Throat:     Mouth: Mucous membranes are moist.     Pharynx: Oropharynx is  clear.  Eyes:     General:        Right eye: No discharge.        Left eye: No discharge.     Extraocular Movements: Extraocular movements intact.     Conjunctiva/sclera: Conjunctivae normal.     Pupils: Pupils are equal, round, and reactive to light.  Cardiovascular:     Rate and Rhythm: Normal rate and regular rhythm.     Heart sounds: No murmur heard. Pulmonary:     Effort: Pulmonary effort is normal. No respiratory distress.     Breath sounds: Normal breath sounds. No wheezing or rales.  Musculoskeletal:     Cervical back: Normal range of motion and neck supple.  Skin:    General: Skin is warm and dry.     Capillary Refill: Capillary refill takes less than 2 seconds.  Neurological:     General: No focal deficit present.     Mental Status: She is alert and oriented to person, place, and time. Mental status is at baseline.  Psychiatric:        Mood and Affect: Mood normal.        Behavior: Behavior normal.        Thought Content: Thought content normal.        Judgment: Judgment  normal.     Results for orders placed or performed in visit on 12/07/22  Comp Met (CMET)  Result Value Ref Range   Glucose 133 (H) 70 - 99 mg/dL   BUN 10 8 - 27 mg/dL   Creatinine, Ser 0.98 0.57 - 1.00 mg/dL   eGFR 66 >59 mL/min/1.73   BUN/Creatinine Ratio 10 (L) 12 - 28   Sodium 142 134 - 144 mmol/L   Potassium 4.0 3.5 - 5.2 mmol/L   Chloride 100 96 - 106 mmol/L   CO2 25 20 - 29 mmol/L   Calcium 9.4 8.7 - 10.3 mg/dL   Total Protein 7.3 6.0 - 8.5 g/dL   Albumin 4.3 3.8 - 4.9 g/dL   Globulin, Total 3.0 1.5 - 4.5 g/dL   Albumin/Globulin Ratio 1.4 1.2 - 2.2   Bilirubin Total 0.3 0.0 - 1.2 mg/dL   Alkaline Phosphatase 75 44 - 121 IU/L   AST 9 0 - 40 IU/L   ALT 10 0 - 32 IU/L  Lipid Profile  Result Value Ref Range   Cholesterol, Total 117 100 - 199 mg/dL   Triglycerides 159 (H) 0 - 149 mg/dL   HDL 50 >39 mg/dL   VLDL Cholesterol Cal 26 5 - 40 mg/dL   LDL Chol Calc (NIH) 41 0 - 99 mg/dL    Chol/HDL Ratio 2.3 0.0 - 4.4 ratio  HgB A1c  Result Value Ref Range   Hgb A1c MFr Bld 10.1 (H) 4.8 - 5.6 %   Est. average glucose Bld gHb Est-mCnc 243 mg/dL  Microalbumin, Urine Waived  Result Value Ref Range   Microalb, Ur Waived 10 0 - 19 mg/L   Creatinine, Urine Waived 10 10 - 300 mg/dL   Microalb/Creat Ratio 30-300 (H) <30 mg/g      Assessment & Plan:   Problem List Items Addressed This Visit       Endocrine   Diabetes mellitus ([place]) - Primary    Chronic. Sugars are improved to 115-130.  Needs Levemir changed to Lantus due to insurance no longer covering levemir.  Discussed this with patient during visit today.  Referral placed for pharmacy team to help optimize medications. Follow up in 2 months.  Call sooner if concerns arise.       Relevant Medications   insulin glargine (LANTUS SOLOSTAR) 100 UNIT/ML Solostar Pen   Other Relevant Orders   AMB Referral to Pharmacy Medication Management     Follow up plan: Return if symptoms worsen or fail to improve.

## 2023-01-04 ENCOUNTER — Ambulatory Visit (INDEPENDENT_AMBULATORY_CARE_PROVIDER_SITE_OTHER): Payer: Medicare Other | Admitting: Nurse Practitioner

## 2023-01-04 ENCOUNTER — Encounter: Payer: Self-pay | Admitting: Nurse Practitioner

## 2023-01-04 VITALS — BP 97/68 | HR 88 | Temp 98.0°F | Wt 274.9 lb

## 2023-01-04 DIAGNOSIS — E1169 Type 2 diabetes mellitus with other specified complication: Secondary | ICD-10-CM

## 2023-01-04 DIAGNOSIS — Z794 Long term (current) use of insulin: Secondary | ICD-10-CM | POA: Diagnosis not present

## 2023-01-04 MED ORDER — LANTUS SOLOSTAR 100 UNIT/ML ~~LOC~~ SOPN: 30.0000 [IU] | PEN_INJECTOR | Freq: Every day | SUBCUTANEOUS | 1 refills | Status: DC

## 2023-01-04 NOTE — Assessment & Plan Note (Signed)
Chronic. Sugars are improved to 115-130.  Needs Levemir changed to Lantus due to insurance no longer covering levemir.  Discussed this with patient during visit today.  Referral placed for pharmacy team to help optimize medications. Follow up in 2 months.  Call sooner if concerns arise.

## 2023-01-07 ENCOUNTER — Telehealth: Payer: Self-pay

## 2023-01-07 NOTE — Progress Notes (Signed)
   Care Guide Note  01/07/2023 Name: [name] MRN: [id number] DOB: 04-Apr-1961  Referred by: Jon Billings, NP Reason for referral : Care Coordination (Outreach to schedule with pharm d)   Emily Hinton- Emily Hinton is a 62 y.o. year old female who is a primary care patient of Jon Billings, NP. Emily Hinton- Emily Hinton was referred to the pharmacist for assistance related to HTN and DM.    Successful contact was made with the patient to discuss pharmacy services including being ready for the pharmacist to call at least 5 minutes before the scheduled appointment time, to have medication bottles and any blood sugar or blood pressure readings ready for review. The patient agreed to meet with the pharmacist via with the pharmacist via telephone visit on (date/time).  04/05/20204  Emily Hinton, [place], [place] [address] Direct Dial: [phone] [name]

## 2023-01-10 ENCOUNTER — Encounter (HOSPITAL_COMMUNITY): Payer: Medicare Other

## 2023-01-10 ENCOUNTER — Ambulatory Visit: Payer: Medicare (Managed Care) | Admitting: Family

## 2023-01-17 ENCOUNTER — Ambulatory Visit: Payer: Medicare Other | Attending: Nurse Practitioner

## 2023-01-17 DIAGNOSIS — I5022 Chronic systolic (congestive) heart failure: Secondary | ICD-10-CM

## 2023-01-17 DIAGNOSIS — E669 Obesity, unspecified: Secondary | ICD-10-CM | POA: Diagnosis not present

## 2023-01-17 DIAGNOSIS — I2510 Atherosclerotic heart disease of native coronary artery without angina pectoris: Secondary | ICD-10-CM | POA: Diagnosis not present

## 2023-01-17 DIAGNOSIS — Z6838 Body mass index (BMI) 38.0-38.9, adult: Secondary | ICD-10-CM | POA: Diagnosis not present

## 2023-01-17 DIAGNOSIS — Z79899 Other long term (current) drug therapy: Secondary | ICD-10-CM | POA: Diagnosis not present

## 2023-01-17 DIAGNOSIS — I110 Hypertensive heart disease with heart failure: Secondary | ICD-10-CM | POA: Diagnosis not present

## 2023-01-17 DIAGNOSIS — I428 Other cardiomyopathies: Secondary | ICD-10-CM | POA: Diagnosis not present

## 2023-01-17 DIAGNOSIS — E119 Type 2 diabetes mellitus without complications: Secondary | ICD-10-CM | POA: Diagnosis not present

## 2023-01-17 DIAGNOSIS — Z7984 Long term (current) use of oral hypoglycemic drugs: Secondary | ICD-10-CM | POA: Diagnosis not present

## 2023-01-17 DIAGNOSIS — Z794 Long term (current) use of insulin: Secondary | ICD-10-CM | POA: Diagnosis not present

## 2023-01-18 ENCOUNTER — Other Ambulatory Visit: Payer: Medicare Other

## 2023-01-18 ENCOUNTER — Encounter: Payer: Self-pay | Admitting: Physician Assistant

## 2023-01-18 ENCOUNTER — Ambulatory Visit: Payer: Medicare Other | Admitting: Physician Assistant

## 2023-01-18 ENCOUNTER — Telehealth: Payer: Self-pay

## 2023-01-18 ENCOUNTER — Other Ambulatory Visit (HOSPITAL_COMMUNITY): Payer: Self-pay

## 2023-01-18 VITALS — BP 126/94 | HR 84 | Ht 71.0 in | Wt 278.0 lb

## 2023-01-18 DIAGNOSIS — I5022 Chronic systolic (congestive) heart failure: Secondary | ICD-10-CM | POA: Insufficient documentation

## 2023-01-18 DIAGNOSIS — Z6838 Body mass index (BMI) 38.0-38.9, adult: Secondary | ICD-10-CM

## 2023-01-18 DIAGNOSIS — R918 Other nonspecific abnormal finding of lung field: Secondary | ICD-10-CM | POA: Diagnosis not present

## 2023-01-18 DIAGNOSIS — I428 Other cardiomyopathies: Secondary | ICD-10-CM | POA: Diagnosis not present

## 2023-01-18 DIAGNOSIS — E669 Obesity, unspecified: Secondary | ICD-10-CM

## 2023-01-18 DIAGNOSIS — I2510 Atherosclerotic heart disease of native coronary artery without angina pectoris: Secondary | ICD-10-CM | POA: Diagnosis not present

## 2023-01-18 DIAGNOSIS — I110 Hypertensive heart disease with heart failure: Secondary | ICD-10-CM | POA: Insufficient documentation

## 2023-01-18 DIAGNOSIS — E119 Type 2 diabetes mellitus without complications: Secondary | ICD-10-CM | POA: Diagnosis not present

## 2023-01-18 DIAGNOSIS — Z794 Long term (current) use of insulin: Secondary | ICD-10-CM

## 2023-01-18 DIAGNOSIS — I10 Essential (primary) hypertension: Secondary | ICD-10-CM

## 2023-01-18 DIAGNOSIS — Z79899 Other long term (current) drug therapy: Secondary | ICD-10-CM | POA: Insufficient documentation

## 2023-01-18 DIAGNOSIS — Z7984 Long term (current) use of oral hypoglycemic drugs: Secondary | ICD-10-CM | POA: Insufficient documentation

## 2023-01-18 MED ORDER — EMPAGLIFLOZIN 25 MG PO TABS: 25.0000 mg | ORAL_TABLET | Freq: Every day | ORAL | 1 refills | Status: DC

## 2023-01-18 NOTE — Progress Notes (Signed)
01/18/2023 Name: Emily Hinton MRN: [id number] DOB: 06/09/1961  Chief Complaint  Patient presents with   Diabetes Management Plan   Emily Hinton is a 62 y.o. year old female who presented for a telephone visit.   They were referred to the pharmacist by their PCP for assistance in managing diabetes.   Patient is participating in a Managed Medicaid Plan:  No  Subjective: Telephone visit to address diabetes management and medication management overall. Care Team: Primary Care Provider: Larae Grooms, NP ; Next Scheduled Visit: 5/24  Medication Access/Adherence Current Pharmacy:  [id number] Nicholes Rough, [place] - [address] AT [facility] [address] [place] [address] Phone: [phone] Fax: [phone]  -Patient reports affordability concerns with their medications: Yes  -Patient reports access/transportation concerns to their pharmacy: No  -Patient reports adherence concerns with their medications:  No    Diabetes: Current medications: Jardiance 10mg  daily, Lantus 30 units daily, Novolin R 6 units TID, Metformin 1000mg  BID, Ozempic 0.5mg  weekly -Patient endorses excellent adherence to medication regimen and states she tolerates all medications well -Does state that all medications are covered on her insurance, but Ozempic is $47/month which is sometimes hard to afford -Previously tried increasing Ozempic to 1mg  and could not tolerate GI side effects -Current glucose readings: range 116-143 with an average of 130 over the past 5 days -Testing FBG then in the afternoon before dinner and 2 hours after dinner -Patient denies hypoglycemic s/sx including dizziness, shakiness, sweating.  -Patient denies hyperglycemic symptoms including polyuria, polydipsia, polyphagia, nocturia, neuropathy, blurred vision.  Hypertension: Current medications: carvedilol 25mg  BID, spironolactone 25mg  daily,  torsemide 20mg  daily, Entresto 97/103mg  BID -Endorses excellent adherence and no barriers in regard to access or affordability of medications -Patient has a validated, automated, upper arm home BP cuff -Current blood pressure readings readings: 120/70 -Patient denies hypotensive s/sx including dizziness, lightheadedness. States she had a cardiac test performed yesterday and blacked out 3 minutes into it.  BP was checked and was normal.  She attributes the episode to something being placed around her head near her shunt placement, as pressure to the area has caused her to black out before. -Patient denies hypertensive symptoms including headache, chest pain, shortness of breath  Objective: Lab Results  Component Value Date   HGBA1C 10.1 (H) 12/07/2022   Lab Results  Component Value Date   CREATININE 0.98 12/07/2022   BUN 10 12/07/2022   NA 142 12/07/2022   K 4.0 12/07/2022   CL 100 12/07/2022   CO2 25 12/07/2022   Lab Results  Component Value Date   CHOL 117 12/07/2022   HDL 50 12/07/2022   LDLCALC 41 12/07/2022   TRIG 159 (H) 12/07/2022   CHOLHDL 2.3 12/07/2022   Medications Reviewed Today     Reviewed by [name], RPH (Pharmacist) on 01/18/23 at 0907  Med List Status: <None>   Medication Order Taking? Sig Documenting Provider Last Dose Status Informant  acetaminophen (TYLENOL) 500 MG tablet [id number] Yes Take 500 mg by mouth every 6 (six) hours as needed. [provider] Taking Active   aspirin 81 MG chewable tablet [id number] Yes Chew 81 mg by mouth daily. [provider] Taking Active Self  carvedilol (COREG) 25 MG tablet [id number] Yes Take 1 tablet (25 mg total) by mouth 2 (two) times daily. Sondra Barges, PA-C Taking Active   citalopram (CELEXA) 20 MG tablet [id number] Yes TAKE 1 TABLET(20 MG)  BY MOUTH DAILY Larae Grooms, NP Taking Active   empagliflozin (JARDIANCE) 10 MG TABS tablet [id number] Yes Take 1 tablet (10 mg) by mouth once daily  [provider] Taking Active   gabapentin (NEURONTIN) 300 MG capsule [id number] Yes Take 300 mg by mouth 3 (three) times daily. [provider] Taking Active   insulin glargine (LANTUS SOLOSTAR) 100 UNIT/ML Solostar Pen [id number] Yes Inject 30 Units into the skin daily. Larae Grooms, NP Taking Active   insulin regular (NOVOLIN R) 100 units/mL injection [id number] Yes Inject 0.06 mLs (6 Units total) into the skin in the morning, at noon, and at bedtime. Larae Grooms, NP Taking Active   metFORMIN (GLUCOPHAGE) 1000 MG tablet [id number] Yes Take 1,000 mg by mouth 2 (two) times daily with a meal. [provider] Taking Active Self  omeprazole (PRILOSEC) 20 MG capsule [id number] Yes Take 20 mg by mouth daily. [provider] Taking Active Self  rosuvastatin (CRESTOR) 10 MG tablet [id number] Yes Take 1 tablet (10 mg total) by mouth daily. Larae Grooms, NP Taking Active   sacubitril-valsartan (ENTRESTO) 97-103 MG [id number] Yes Take 1 tablet by mouth 2 (two) times daily. [provider] Taking Active   Semaglutide,0.25 or 0.5MG /DOS, (OZEMPIC, 0.25 OR 0.5 MG/DOSE,) 2 MG/1.5ML SOPN [id number] Yes Inject 0.5 mg into the skin once a week. Sondra Barges, PA-C Taking Active   spironolactone (ALDACTONE) 25 MG tablet [id number] Yes Take 25 mg by mouth daily. [provider] Taking Active Self  torsemide (DEMADEX) 20 MG tablet [id number] Yes Take 2 tablets (40 mg total) by mouth daily for 2 days, THEN 1 tablet (20 mg total) daily. Laurey Morale, MD Taking Active            Assessment/Plan:   Diabetes: - Currently uncontrolled - Reviewed goal A1c, goal fasting, and goal 2 hour post prandial glucose - Recommend to increase Jardiance to 25mg  daily.  Patient has 1 month supply of Jardiance 10mg  on hand, which I suggested she complete and then start 25mg  daily.  Order pending for Larae Grooms to sign if in agreement.  - Recommend to continue to  check glucose three times daily and record - Meets financial criteria for Ozempic patient assistance program through [employer]. Will collaborate with provider, CPhT, and patient to pursue assistance.   Hypertension: - Currently controlled - Continue to check home BP regularly and contact office if ever consistently <90/60 - Recommend to continue current regimen   Follow Up Plan: Telephone visit scheduled in 6 weeks once Jardiance 25mg  has been on board at least 7-10 days to see what home BG readings are  [name], PharmD, DPLA

## 2023-01-18 NOTE — Progress Notes (Signed)
Cardiology Office Note    Date:  01/18/2023   ID:  [name], DOB September 29, 1961, MRN [id number]  PCP:  Larae Grooms, NP  Cardiologist:  Julien Nordmann, MD  Electrophysiologist:  None   Chief Complaint: Follow-up  History of Present Illness:   Emily Hinton is a 62 y.o. 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   Emily Hinton was previously followed by cardiology in [place]. She was found to have a cardiomyopathy in 2018 with an EF of 45%. LHC at that time showed nonobstructive coronary arteries.  Echo in 05/2018 demonstrated a persistent cardiomyopathy with an EF of 45%, global hypokinesis, and diastolic dysfunction. This finding was unchanged when compared to echo in 2018.  Subsequent echo in 04/2021 showed an improved LV systolic function with an EF of 55%, probable normal LV wall motion, and diastolic dysfunction. She moved to [facility] from [place] several years ago.    She was admitted to the hospital in 01/2022 with worsening dyspnea and productive cough and found to have multilobar pneumonia with mild volume overload.  She was hypertensive and tachycardic upon presentation with an oxygen saturation of 72% on room air.  High-sensitivity troponin 35 with a delta troponin of 20.  BNP 189.  Chest x-ray showed volume overload versus pneumonia with small pleural effusions.  CT of the chest showed multilobar pneumonia with minimal pleural fluid on the left.  Echo during this admission showed an EF of 40-45%, mildly to moderately dilated LV internal cavity size, global hypokinesis, Gr2DD, normal RV systolic function  and ventricular cavity size, and mild mitral regurgitation.     Coronary CTA on 03/01/2022 showed a calcium score of 0, with no evidence of CAD.  Noncardiac over read was notable for near complete resolution of groundglass opacities when compared to imaging from 01/2022.  There were bandlike changes along the posterior pleural space felt to likely reflect a mixture of atelectasis and postinflammatory changes that were also improving.  It was recommended she follow-up with her PCP for these improving changes.   She was seen in the office on 04/12/2022 and was without symptoms of angina or decompensation.  She had stable 2-3 pillow orthopnea given prior CSF leak.  She remained on Entresto and had been started on lisinopril by outside office.  It was recommended that she discontinue lisinopril.  Amlodipine was also discontinued and Entresto was titrated to 49/51 mg twice daily.  She was otherwise continued on carvedilol, spironolactone, torsemide, and Jardiance.   She was seen in the office on 05/17/2022 and continued to do well from a cardiac perspective, without symptoms of angina or decompensation.  Her weight was stable by our scale.  Entresto was titrated to 97/103 mg twice daily and carvedilol was titrated to 12.5 mg twice daily.  She was otherwise continued on Jardiance, spironolactone, and torsemide.  Labs showed mild AKI at that time leading torsemide to be transitioned to as needed.  Follow-up limited echo 06/21/2022 showed a persistent cardiomyopathy with an EF of 35 to 40%, global hypokinesis, mildly dilated internal LV cavity size, grade 1 diastolic dysfunction, normal RV systolic function and ventricular cavity size,  mild mitral regurgitation, and an estimated right atrial pressure of 3 mmHg.     She was seen in the office on 07/05/2022 and remained without symptoms of angina or decompensation with stable chronic dyspnea.  Her weight was stable.  Carvedilol was titrated to 25 mg daily with continuation of  Entresto, Jardiance, spironolactone, and torsemide.  Cardiac MRI on 07/18/2022 demonstrated an LVEF of 32% with no evidence of MI, scar, or LGE with normal RV systolic function and ventricular cavity size and no significant valvular abnormalities.  Findings were suggestive of nonischemic cardiomyopathy.  I last saw her in 10/2022, at which time she was without symptoms of angina or cardiac decompensation.  Given persistent cardiomyopathy despite optimization of GDMT, she was referred to the advanced heart failure service and evaluated by them on 11/30/2022, at which time she was noted to have a mildly elevated ReDs vest of 36%.  Her dyspnea was felt to be somewhat out of proportion to her exam.  Torsemide was briefly titrated.  CPX was recommended and is pending at this time.  CPX study indicates the test was limited by sudden onset of headache and dizziness.  She comes in today and is doing well from a cardiac perspective, without symptoms of angina or cardiac decompensation.  She has stable 2-3 pillow orthopnea in the setting of VP shunt.  Weight is stable.  No progressive abdominal distention or lower extremity swelling.  She does report a history of her feet burning and tingling at nighttime when laying down.  No symptoms of claudication.   Labs independently reviewed: 11/2022 - A1c 10.1, TC 117, TG 159, HDL 50, LDL 41, BUN 10, serum creatinine 0.98, potassium 4.0, albumin 4.3, AST/ALT normal, BNP 37 09/2021 - TSH normal, magnesium 2.0   Past Medical History:  Diagnosis Date   CHF (congestive heart failure)    CSF leak    Diabetes mellitus without complication    DM2 (diabetes mellitus, type 2)    Essential hypertension    Heart attack    Hyperlipidemia    Morbid obesity    NICM (nonischemic cardiomyopathy)    Nonobstructive atherosclerosis of coronary artery    OSA (obstructive sleep apnea)     Past Surgical History:  Procedure Laterality Date   CERVICAL FUSION  2017   CSF leak repair x  2     2014 and 2017   CSF SHUNT  2017    Current Medications: Current Meds  Medication Sig   acetaminophen (TYLENOL) 500 MG tablet Take 500 mg by mouth every 6 (six) hours as needed.   aspirin 81 MG chewable tablet Chew 81 mg by mouth daily.   carvedilol (COREG) 25 MG tablet Take 1 tablet (25 mg total) by mouth 2 (two) times daily.   citalopram (CELEXA) 20 MG tablet TAKE 1 TABLET(20 MG) BY MOUTH DAILY   empagliflozin (JARDIANCE) 25 MG TABS tablet Take 1 tablet (25 mg total) by mouth daily before breakfast. To replace - place on hold until patient requests please   gabapentin (NEURONTIN) 300 MG capsule Take 300 mg by mouth 3 (three) times daily.   insulin glargine (LANTUS SOLOSTAR) 100 UNIT/ML Solostar Pen Inject 30 Units into the skin daily.   insulin regular (NOVOLIN R) 100 units/mL injection Inject 0.06 mLs (6 Units total) into the skin in the morning, at noon, and at bedtime.   metFORMIN (GLUCOPHAGE) 1000 MG tablet Take 1,000 mg by mouth 2 (two) times daily with a meal.   omeprazole (PRILOSEC) 20  MG capsule Take 20 mg by mouth daily.   rosuvastatin (CRESTOR) 10 MG tablet Take 1 tablet (10 mg total) by mouth daily.   sacubitril-valsartan (ENTRESTO) 97-103 MG Take 1 tablet by mouth 2 (two) times daily.   Semaglutide,0.25 or 0.5MG /DOS, (OZEMPIC, 0.25 OR 0.5 MG/DOSE,) 2 MG/1.5ML SOPN Inject 0.5 mg into the skin once a week.   spironolactone (ALDACTONE) 25 MG tablet Take 25 mg by mouth daily.   torsemide (DEMADEX) 20 MG tablet Take 2 tablets (40 mg total) by mouth daily for 2 days, THEN 1 tablet (20 mg total) daily.    Allergies:   Cashew nut (anacardium occidentale) skin test and Cashew nut oil   Social History   Socioeconomic History   Marital status: Single    Spouse name: Not on file   Number of children: Not on file   Years of education: Not on file   Highest education level: Not on file  Occupational History   Not on file  Tobacco Use   Smoking status: Never   Smokeless  tobacco: Never  Vaping Use   Vaping Use: Never used  Substance and Sexual Activity   Alcohol use: Never   Drug use: Never   Sexual activity: Not on file  Other Topics Concern   Not on file  Social History Narrative   Not on file   Social Determinants of Health   Financial Resource Strain: Not on file  Food Insecurity: Not on file  Transportation Needs: Not on file  Physical Activity: Not on file  Stress: Not on file  Social Connections: Not on file     Family History:  The patient's family history includes Heart failure in her father; Hypertension in her father; Kidney disease in her father. There is no history of Breast cancer.  ROS:   12-point review of systems is negative unless otherwise noted in the HPI.   EKGs/Labs/Other Studies Reviewed:    Studies reviewed were summarized above. The additional studies were reviewed today:  2D echo 01/30/2022: 1. Left ventricular ejection fraction, by estimation, is 40 to 45%. The  left ventricle has mildly decreased function. The left ventricle  demonstrates global hypokinesis. The left ventricular internal cavity size  was mildly to moderately dilated. Left  ventricular diastolic parameters are consistent with Grade II diastolic  dysfunction (pseudonormalization). Elevated left atrial pressure.   2. Right ventricular systolic function is normal. The right ventricular  size is normal. Tricuspid regurgitation signal is inadequate for assessing  PA pressure.   3. The mitral valve is normal in structure. Mild mitral valve  regurgitation. No evidence of mitral stenosis.   4. The aortic valve was not well visualized. Aortic valve regurgitation  is not visualized. No aortic stenosis is present. __________   Coronary CTA 03/01/2022: Noncardiac over read -  1. Near complete resolution of ground-glass opacities in the chest when compared to the recent study from April of 2023. 2. Bandlike changes along the posterior pleural surface  likely a mixture of atelectasis and post inflammatory changes also showing improvement since previous imaging. This may reflect developing post inflammatory fibrosis with and is mixed atelectatic changes at the lung bases, suggest attention on follow-up.   Cardiac overread -  Aorta:  Normal size.  No calcifications.  No dissection.   Aortic Valve:  Trileaflet.  No calcifications.   Coronary Arteries:  Normal coronary origin.  Right dominance.   RCA is a dominant artery that gives rise to PDA and PLA. There is no  plaque.   Left main gives rise to LAD and LCX arteries. There is no LM disease.   LAD has no plaque.   LCX is a non-dominant artery that gives rise to a large obtuse marginal branch. There is no plaque.   Other findings:   Normal pulmonary vein drainage into the left atrium.   Normal left atrial appendage without a thrombus.   Normal size of the pulmonary artery.   IMPRESSION: 1. Normal coronary calcium score of 0. Patient is low risk for coronary events. 2. Normal coronary origin with right dominance. 3. No evidence of CAD. 4. CAD-RADS 0. Consider non-atherosclerotic causes of chest pain. __________   Limited echo 06/21/2022: 1. Left ventricular ejection fraction, by estimation, is 35 to 40%. The  left ventricle has moderately decreased function. The left ventricle  demonstrates global hypokinesis. The left ventricular internal cavity size  was mildly dilated. Left ventricular  diastolic parameters are consistent with Grade I diastolic dysfunction  (impaired relaxation).   2. Right ventricular systolic function is normal. The right ventricular  size is normal. Tricuspid regurgitation signal is inadequate for assessing  PA pressure.   3. The mitral valve is normal in structure. Mild mitral valve  regurgitation. No evidence of mitral stenosis.   4. The aortic valve is normal in structure. Aortic valve regurgitation is  not visualized. No aortic stenosis is  present.   5. The inferior vena cava is normal in size with greater than 50%  respiratory variability, suggesting right atrial pressure of 3 mmHg. __________   Cardiac MRI 07/18/2022: FINDINGS: 1. Mildly dilated left ventricular size. Moderately reduced LV systolic function (LVEF = 32%). There is global hypokinesis with no regional wall motion abnormalities.   There is no late gadolinium enhancement in the left ventricular myocardium.   LVEDV: 276 ml   LVESV: 187 ml   SV: 89 ml   CO: 4.9 L/min   Myocardial mass: 152   2. Normal right ventricular size, thickness and low normal systolic function (RVEF = 47%). There are no regional wall motion abnormalities.   3.  Normal left and right atrial size.   4. Normal size of the aortic root, ascending aorta and pulmonary artery. Normal pulmonic and aortic flow. Qp:Qs = 1.05   5.  Trace MR, Trace TR. no other significant valvular abnormalities.   6.  Normal pericardium.  No pericardial effusion.   IMPRESSION: 1.  Mildly dilated LV, moderately reduced LV function.  LVEF = 32% 2. No evidence for myocardial infiltration, late gadolinium enhancement or scar. 3.  No significant valvular abnormalities. 4.  Normal RV size and function 5.  Findings suggest non-ischemic cardiomyopathy. __________  Limited echo 11/02/2022: 1. Left ventricular ejection fraction, by estimation, is 40 to 45%. Left  ventricular ejection fraction by 3D volume is 43 %. The left ventricle has  mildly decreased function. The left ventricle demonstrates global  hypokinesis. The left ventricular  internal cavity size was mildly dilated. Left ventricular diastolic  parameters are indeterminate. The average left ventricular global  longitudinal strain is -8.5 %. The global longitudinal strain is abnormal.   2. Right ventricular systolic function is normal. The right ventricular  size is normal.   3. Left atrial size was mildly dilated.   4. The mitral valve is  normal in structure. Moderate mitral valve  regurgitation. No evidence of mitral stenosis.   5. The aortic valve is normal in structure. Aortic valve regurgitation is  not visualized. No aortic stenosis is present.  6. The inferior vena cava is normal in size with greater than 50%  respiratory variability, suggesting right atrial pressure of 3 mmHg.   Comparison(s): 06/21/2022-EF 35-40%.    EKG:  EKG is not ordered today.   Recent Labs: 02/03/2022: Hemoglobin 13.8; Platelets 330 11/30/2022: B Natriuretic Peptide 37.7 12/07/2022: ALT 10; BUN 10; Creatinine, Ser 0.98; Potassium 4.0; Sodium 142  Recent Lipid Panel    Component Value Date/Time   CHOL 117 12/07/2022 1054   TRIG 159 (H) 12/07/2022 1054   HDL 50 12/07/2022 1054   CHOLHDL 2.3 12/07/2022 1054   LDLCALC 41 12/07/2022 1054    PHYSICAL EXAM:    VS:  BP (!) 126/94 (BP Location: Left Arm, Patient Position: Sitting, Cuff Size: Large)   Pulse 84   Ht 5\' 11"  (1.803 m)   Wt 278 lb (126.1 kg)   SpO2 99%   BMI 38.77 kg/m   BMI: Body mass index is 38.77 kg/m.  Physical Exam Vitals reviewed.  Constitutional:      Appearance: She is well-developed.  HENT:     Head: Normocephalic and atraumatic.  Eyes:     General:        Right eye: No discharge.        Left eye: No discharge.  Neck:     Vascular: No JVD.  Cardiovascular:     Rate and Rhythm: Normal rate and regular rhythm.     Heart sounds: Normal heart sounds, S1 normal and S2 normal. Heart sounds not distant. No midsystolic click and no opening snap. No murmur heard.    No friction rub.  Pulmonary:     Effort: Pulmonary effort is normal. No respiratory distress.     Breath sounds: Normal breath sounds. No decreased breath sounds, wheezing or rales.  Chest:     Chest wall: No tenderness.  Abdominal:     General: There is no distension.  Musculoskeletal:     Cervical back: Normal range of motion.     Right lower leg: No edema.     Left lower leg: No edema.   Skin:    General: Skin is warm and dry.     Nails: There is no clubbing.  Neurological:     Mental Status: She is alert and oriented to person, place, and time.  Psychiatric:        Speech: Speech normal.        Behavior: Behavior normal.        Thought Content: Thought content normal.        Judgment: Judgment normal.     Wt Readings from Last 3 Encounters:  01/18/23 278 lb (126.1 kg)  01/04/23 274 lb 14.4 oz (124.7 kg)  12/07/22 274 lb 6.4 oz (124.5 kg)     ASSESSMENT & PLAN:   HFrEF secondary to NICM: She is euvolemic and well compensated with NYHA class II symptoms.  She remains on carvedilol 25 mg twice daily, Jardiance 25 mg daily, Entresto 97/103 mg twice daily, spironolactone 25 mg daily, and torsemide 20 mg daily.  Most recent echo showed an improvement in her LV systolic function with an EF of 45%.  CPX pending.  Follow-up with advanced heart failure as directed.  Nonobstructive CAD: No symptoms suggestive of angina.  Recent coronary CTA showed calcium score of 0 with no evidence of CAD.  Continue aggressive risk factor modification and primary prevention including aspirin, carvedilol, and rosuvastatin.  No indication for further ischemic testing at this time.  HTN: Blood  pressure reasonably controlled.  Medical therapy as outlined above.  Abnormal chest CT: Noncardiac over read of coronary CTA showed improving inflammatory changes as well as near complete resolution of groundglass opacities.  Would look to revisit this in follow-up.  Class II obesity: PCP's office is working on patient assistance paperwork for [employer].  Weight loss is encouraged heartily diet and regular exercise.  IDDM: A1c 10.1 in 11/2022.  Ongoing management per PCP.  Lower extremity discomfort: Appears to be more consistent with neuropathy, likely from prolonged history of diabetes.  Less consistent with claudication.  ABI has previously been ordered.   Disposition: F/u with Dr. Mariah Milling or an APP in  4 months.   Medication Adjustments/Labs and Tests Ordered: Current medicines are reviewed at length with the patient today.  Concerns regarding medicines are outlined above. Medication changes, Labs and Tests ordered today are summarized above and listed in the Patient Instructions accessible in Encounters.   Signed, Eula Listen, PA-C 01/18/2023 1:53 PM     [facility] [place] [address] [place], [place] [address] [phone]

## 2023-01-18 NOTE — Telephone Encounter (Signed)
Submitted online patient assistance application for Ozempic through [employer]

## 2023-01-18 NOTE — Patient Instructions (Signed)
Medication Instructions:   Your physician recommends that you continue on your current medications as directed. Please refer to the Current Medication list given to you today.  *If you need a refill on your cardiac medications before your next appointment, please call your pharmacy*   Lab Work:  No labs ordered today  If you have labs (blood work) drawn today and your tests are completely normal, you will receive your results only by: MyChart Message (if you have MyChart) OR A paper copy in the mail If you have any lab test that is abnormal or we need to change your treatment, we will call you to review the results.   Testing/Procedures:  No testing ordered today   Follow-Up: At [facility], you and your health needs are our priority.  As part of our continuing mission to provide you with exceptional heart care, we have created designated Provider Care Teams.  These Care Teams include your primary Cardiologist (physician) and Advanced Practice Providers (APPs -  Physician Assistants and Nurse Practitioners) who all work together to provide you with the care you need, when you need it.  We recommend signing up for the patient portal called "MyChart".  Sign up information is provided on this After Visit Summary.  MyChart is used to connect with patients for Virtual Visits (Telemedicine).  Patients are able to view lab/test results, encounter notes, upcoming appointments, etc.  Non-urgent messages can be sent to your provider as well.   To learn more about what you can do with MyChart, go to [email or web address].    Your next appointment:   4 month(s)  Provider:   You may see Julien Nordmann, MD or one of the following Advanced Practice Providers on your designated Care Team:   Nicolasa Ducking, NP Eula Listen, PA-C Cadence Fransico Michael, PA-C Charlsie Quest, NP

## 2023-01-18 NOTE — Telephone Encounter (Signed)
-----   Message from [name], [facility] sent at 01/18/2023 10:02 AM EDT ----- Good morning!  Based on household income, patient would qualify for Ozempic PAP; would you all mind to initiate this process, please?  Thank you!

## 2023-01-19 ENCOUNTER — Other Ambulatory Visit: Payer: Self-pay | Admitting: Nurse Practitioner

## 2023-01-21 ENCOUNTER — Other Ambulatory Visit: Payer: Self-pay

## 2023-01-21 MED ORDER — METFORMIN HCL 1000 MG PO TABS
1000.0000 mg | ORAL_TABLET | Freq: Two times a day (BID) | ORAL | 1 refills | Status: DC
  Filled 2023-01-21: qty 180, 90d supply, fill #0

## 2023-01-24 ENCOUNTER — Encounter (HOSPITAL_COMMUNITY): Payer: Medicare Other

## 2023-01-24 ENCOUNTER — Telehealth: Payer: Self-pay | Admitting: Nurse Practitioner

## 2023-01-24 NOTE — Telephone Encounter (Signed)
Copied from CRM [id number]. Topic: General - Other >> Jan 24, 2023 11:42 AM Everette C wrote: Reason for CRM: The patient would like to speak with a member of staff regarding their previously requested referral for a pain management specialist   Please contact further when possible

## 2023-01-25 NOTE — Telephone Encounter (Signed)
Patient has been approved for patient assistance through [employer] for Ozempic until 01/17/2024 PT ID: [id number]

## 2023-01-28 ENCOUNTER — Telehealth: Payer: Self-pay

## 2023-01-28 ENCOUNTER — Ambulatory Visit (INDEPENDENT_AMBULATORY_CARE_PROVIDER_SITE_OTHER): Payer: Medicare Other

## 2023-01-28 VITALS — Ht 71.0 in | Wt 278.0 lb

## 2023-01-28 DIAGNOSIS — Z599 Problem related to housing and economic circumstances, unspecified: Secondary | ICD-10-CM

## 2023-01-28 DIAGNOSIS — Z5912 Inadequate housing utilities: Secondary | ICD-10-CM

## 2023-01-28 DIAGNOSIS — Z0000 Encounter for general adult medical examination without abnormal findings: Secondary | ICD-10-CM | POA: Diagnosis not present

## 2023-01-28 NOTE — Patient Instructions (Signed)
[name] , Thank you for taking time to come for your Medicare Wellness Visit. I appreciate your ongoing commitment to your health goals. Please review the following plan we discussed and let me know if I can assist you in the future.   These are the goals we discussed:  Goals      DIET - EAT MORE FRUITS AND VEGETABLES        This is a list of the screening recommended for you and due dates:  Health Maintenance  Topic Date Due   Zoster (Shingles) Vaccine (1 of 2) Never done   Colon Cancer Screening  Never done   Pap Smear  11/26/2021   COVID-19 Vaccine (4 - 2023-24 season) 06/15/2022   Flu Shot  05/16/2023   Hemoglobin A1C  06/07/2023   Eye exam for diabetics  08/07/2023   Complete foot exam   08/15/2023   Mammogram  11/09/2023   DTaP/Tdap/Td vaccine (2 - Td or Tdap) 11/10/2023   Yearly kidney function blood test for diabetes  12/08/2023   Yearly kidney health urinalysis for diabetes  12/08/2023   Medicare Annual Wellness Visit  01/28/2024   Hepatitis C Screening: USPSTF Recommendation to screen - Ages 18-79 yo.  Completed   HIV Screening  Completed   HPV Vaccine  Aged Out    Advanced directives: no  Conditions/risks identified: none  Next appointment: Follow up in one year for your annual wellness visit. 02/03/24 @ 8:45 am by phone  Preventive Care 40-64 Years, Female Preventive care refers to lifestyle choices and visits with your health care provider that can promote health and wellness. What does preventive care include? A yearly physical exam. This is also called an annual well check. Dental exams once or twice a year. Routine eye exams. Ask your health care provider how often you should have your eyes checked. Personal lifestyle choices, including: Daily care of your teeth and gums. Regular physical activity. Eating a healthy diet. Avoiding tobacco and drug use. Limiting alcohol use. Practicing safe sex. Taking low-dose aspirin daily starting at age  12. Taking vitamin and mineral supplements as recommended by your health care provider. What happens during an annual well check? The services and screenings done by your health care provider during your annual well check will depend on your age, overall health, lifestyle risk factors, and family history of disease. Counseling  Your health care provider may ask you questions about your: Alcohol use. Tobacco use. Drug use. Emotional well-being. Home and relationship well-being. Sexual activity. Eating habits. Work and work Astronomer. Method of birth control. Menstrual cycle. Pregnancy history. Screening  You may have the following tests or measurements: Height, weight, and BMI. Blood pressure. Lipid and cholesterol levels. These may be checked every 5 years, or more frequently if you are over 45 years old. Skin check. Lung cancer screening. You may have this screening every year starting at age 99 if you have a 30-pack-year history of smoking and currently smoke or have quit within the past 15 years. Fecal occult blood test (FOBT) of the stool. You may have this test every year starting at age 70. Flexible sigmoidoscopy or colonoscopy. You may have a sigmoidoscopy every 5 years or a colonoscopy every 10 years starting at age 36. Hepatitis C blood test. Hepatitis B blood test. Sexually transmitted disease (STD) testing. Diabetes screening. This is done by checking your blood sugar (glucose) after you have not eaten for a while (fasting). You may have this done every 1-3  years. Mammogram. This may be done every 1-2 years. Talk to your health care provider about when you should start having regular mammograms. This may depend on whether you have a family history of breast cancer. BRCA-related cancer screening. This may be done if you have a family history of breast, ovarian, tubal, or peritoneal cancers. Pelvic exam and Pap test. This may be done every 3 years starting at age 85.  Starting at age 49, this may be done every 5 years if you have a Pap test in combination with an HPV test. Bone density scan. This is done to screen for osteoporosis. You may have this scan if you are at high risk for osteoporosis. Discuss your test results, treatment options, and if necessary, the need for more tests with your health care provider. Vaccines  Your health care provider may recommend certain vaccines, such as: Influenza vaccine. This is recommended every year. Tetanus, diphtheria, and acellular pertussis (Tdap, Td) vaccine. You may need a Td booster every 10 years. Zoster vaccine. You may need this after age 62. Pneumococcal 13-valent conjugate (PCV13) vaccine. You may need this if you have certain conditions and were not previously vaccinated. Pneumococcal polysaccharide (PPSV23) vaccine. You may need one or two doses if you smoke cigarettes or if you have certain conditions. Talk to your health care provider about which screenings and vaccines you need and how often you need them. This information is not intended to replace advice given to you by your health care provider. Make sure you discuss any questions you have with your health care provider. Document Released: 10/28/2015 Document Revised: 06/20/2016 Document Reviewed: 08/02/2015 Elsevier Interactive Patient Education  2017 [employer].    Fall Prevention in the Home Falls can cause injuries. They can happen to people of all ages. There are many things you can do to make your home safe and to help prevent falls. What can I do on the outside of my home? Regularly fix the edges of walkways and driveways and fix any cracks. Remove anything that might make you trip as you walk through a door, such as a raised step or threshold. Trim any bushes or trees on the path to your home. Use bright outdoor lighting. Clear any walking paths of anything that might make someone trip, such as rocks or tools. Regularly check to see if  handrails are loose or broken. Make sure that both sides of any steps have handrails. Any raised decks and porches should have guardrails on the edges. Have any leaves, snow, or ice cleared regularly. Use sand or salt on walking paths during winter. Clean up any spills in your garage right away. This includes oil or grease spills. What can I do in the bathroom? Use night lights. Install grab bars by the toilet and in the tub and shower. Do not use towel bars as grab bars. Use non-skid mats or decals in the tub or shower. If you need to sit down in the shower, use a plastic, non-slip stool. Keep the floor dry. Clean up any water that spills on the floor as soon as it happens. Remove soap buildup in the tub or shower regularly. Attach bath mats securely with double-sided non-slip rug tape. Do not have throw rugs and other things on the floor that can make you trip. What can I do in the bedroom? Use night lights. Make sure that you have a light by your bed that is easy to reach. Do not use any sheets or blankets  that are too big for your bed. They should not hang down onto the floor. Have a firm chair that has side arms. You can use this for support while you get dressed. Do not have throw rugs and other things on the floor that can make you trip. What can I do in the kitchen? Clean up any spills right away. Avoid walking on wet floors. Keep items that you use a lot in easy-to-reach places. If you need to reach something above you, use a strong step stool that has a grab bar. Keep electrical cords out of the way. Do not use floor polish or wax that makes floors slippery. If you must use wax, use non-skid floor wax. Do not have throw rugs and other things on the floor that can make you trip. What can I do with my stairs? Do not leave any items on the stairs. Make sure that there are handrails on both sides of the stairs and use them. Fix handrails that are broken or loose. Make sure that  handrails are as long as the stairways. Check any carpeting to make sure that it is firmly attached to the stairs. Fix any carpet that is loose or worn. Avoid having throw rugs at the top or bottom of the stairs. If you do have throw rugs, attach them to the floor with carpet tape. Make sure that you have a light switch at the top of the stairs and the bottom of the stairs. If you do not have them, ask someone to add them for you. What else can I do to help prevent falls? Wear shoes that: Do not have high heels. Have rubber bottoms. Are comfortable and fit you well. Are closed at the toe. Do not wear sandals. If you use a stepladder: Make sure that it is fully opened. Do not climb a closed stepladder. Make sure that both sides of the stepladder are locked into place. Ask someone to hold it for you, if possible. Clearly mark and make sure that you can see: Any grab bars or handrails. First and last steps. Where the edge of each step is. Use tools that help you move around (mobility aids) if they are needed. These include: Canes. Walkers. Scooters. Crutches. Turn on the lights when you go into a dark area. Replace any light bulbs as soon as they burn out. Set up your furniture so you have a clear path. Avoid moving your furniture around. If any of your floors are uneven, fix them. If there are any pets around you, be aware of where they are. Review your medicines with your doctor. Some medicines can make you feel dizzy. This can increase your chance of falling. Ask your doctor what other things that you can do to help prevent falls. This information is not intended to replace advice given to you by your health care provider. Make sure you discuss any questions you have with your health care provider. Document Released: 07/28/2009 Document Revised: 03/08/2016 Document Reviewed: 11/05/2014 Elsevier Interactive Patient Education  2017 [employer].

## 2023-01-28 NOTE — Telephone Encounter (Signed)
   Telephone encounter was:  Successful.  01/28/2023 Name: [name] MRN: [id number] DOB: 1961-08-29  Emily Hinton is a 62 y.o. year old female who is a primary care patient of Larae Grooms, NP . The community resource team was consulted for assistance with Food Insecurity and Financial Difficulties related to Financial Strain  Care guide performed the following interventions: Patient provided with information about care guide support team and interviewed to confirm resource needs.Patient is having financial strain and is on disability income. Patients utilities will be cut off by the end of the month. Patient cant afford to by food. I have added referrals to [facility], MAILED AND EMAILED   Follow Up Plan:  No further follow up planned at this time. The patient has been provided with needed resources.    [facility] Guide, [facility] [phone] [address] [place], [place], [place] [address] Phone: [phone] Email: [email or web address]

## 2023-01-28 NOTE — Progress Notes (Signed)
I connected with  Nida Hoffpauir- Gilzene on 01/28/23 by a audio enabled telemedicine application and verified that I am speaking with the correct person using two identifiers.  Patient Location: Home  Provider Location: Office/Clinic  I discussed the limitations of evaluation and management by telemedicine. The patient expressed understanding and agreed to proceed.  Subjective:   Emily Hinton is a 62 y.o. female who presents for an Initial Medicare Annual Wellness Visit.  Review of Systems     Cardiac Risk Factors include: advanced age (>56men, >37 women);diabetes mellitus;hypertension;sedentary lifestyle;obesity (BMI >30kg/m2)     Objective:    There were no vitals filed for this visit. There is no height or weight on file to calculate BMI.     01/28/2023    9:09 AM 01/30/2022    8:35 PM 01/30/2022    3:15 AM  Advanced Directives  Does Patient Have a Medical Advance Directive? No  No  Would patient like information on creating a medical advance directive? No - Patient declined No - Patient declined     Current Medications (verified) Outpatient Encounter Medications as of 01/28/2023  Medication Sig   acetaminophen (TYLENOL) 500 MG tablet Take 500 mg by mouth every 6 (six) hours as needed.   aspirin 81 MG chewable tablet Chew 81 mg by mouth daily.   citalopram (CELEXA) 20 MG tablet TAKE 1 TABLET(20 MG) BY MOUTH DAILY   empagliflozin (JARDIANCE) 25 MG TABS tablet Take 1 tablet (25 mg total) by mouth daily before breakfast. To replace 10mg - place on hold until patient requests please   gabapentin (NEURONTIN) 300 MG capsule Take 300 mg by mouth 3 (three) times daily.   insulin glargine (LANTUS SOLOSTAR) 100 UNIT/ML Solostar Pen Inject 30 Units into the skin daily.   insulin regular (NOVOLIN R) 100 units/mL injection Inject 0.06 mLs (6 Units total) into the skin in the morning, at noon, and at bedtime.   metFORMIN (GLUCOPHAGE) 1000 MG tablet Take 1 tablet  (1,000 mg total) by mouth 2 (two) times daily with a meal.   omeprazole (PRILOSEC) 20 MG capsule Take 20 mg by mouth daily.   rosuvastatin (CRESTOR) 10 MG tablet Take 1 tablet (10 mg total) by mouth daily.   sacubitril-valsartan (ENTRESTO) 97-103 MG Take 1 tablet by mouth 2 (two) times daily.   Semaglutide,0.25 or 0.5MG /DOS, (OZEMPIC, 0.25 OR 0.5 MG/DOSE,) 2 MG/1.5ML SOPN Inject 0.5 mg into the skin once a week.   spironolactone (ALDACTONE) 25 MG tablet Take 25 mg by mouth daily.   torsemide (DEMADEX) 20 MG tablet Take 2 tablets (40 mg total) by mouth daily for 2 days, THEN 1 tablet (20 mg total) daily.   carvedilol (COREG) 25 MG tablet Take 1 tablet (25 mg total) by mouth 2 (two) times daily.   No facility-administered encounter medications on file as of 01/28/2023.    Allergies (verified) Cashew nut (anacardium occidentale) skin test and Cashew nut oil   History: Past Medical History:  Diagnosis Date   CHF (congestive heart failure)    CSF leak    Diabetes mellitus without complication    DM2 (diabetes mellitus, type 2)    Essential hypertension    Heart attack    Hyperlipidemia    Morbid obesity    NICM (nonischemic cardiomyopathy)    Nonobstructive atherosclerosis of coronary artery    OSA (obstructive sleep apnea)    Past Surgical History:  Procedure Laterality Date   CERVICAL FUSION  2017   CSF leak repair x 2  2014 and 2017   CSF SHUNT  2017   Family History  Problem Relation Age of Onset   Heart failure Father    Hypertension Father    Kidney disease Father    Breast cancer Neg Hx    Social History   Socioeconomic History   Marital status: Single    Spouse name: Not on file   Number of children: Not on file   Years of education: Not on file   Highest education level: Not on file  Occupational History   Not on file  Tobacco Use   Smoking status: Never   Smokeless tobacco: Never  Vaping Use   Vaping Use: Never used  Substance and Sexual Activity    Alcohol use: Never   Drug use: Never   Sexual activity: Not on file  Other Topics Concern   Not on file  Social History Narrative   Not on file   Social Determinants of Health   Financial Resource Strain: Medium Risk (01/28/2023)   Overall Financial Resource Strain (CARDIA)    Difficulty of Paying Living Expenses: Somewhat hard  Food Insecurity: No Food Insecurity (01/28/2023)   Hunger Vital Sign    Worried About Running Out of Food in the Last Year: Never true    Ran Out of Food in the Last Year: Never true  Transportation Needs: No Transportation Needs (01/28/2023)   PRAPARE - Administrator, Civil Service (Medical): No    Lack of Transportation (Non-Medical): No  Physical Activity: Insufficiently Active (01/28/2023)   Exercise Vital Sign    Days of Exercise per Week: 2 days    Minutes of Exercise per Session: 20 min  Stress: No Stress Concern Present (01/28/2023)   [employer] of Occupational Health - Occupational Stress Questionnaire    Feeling of Stress : Not at all  Social Connections: Moderately Isolated (01/28/2023)   Social Connection and Isolation Panel [NHANES]    Frequency of Communication with Friends and Family: More than three times a week    Frequency of Social Gatherings with Friends and Family: Three times a week    Attends Religious Services: 1 to 4 times per year    Active Member of Clubs or Organizations: No    Attends Banker Meetings: Never    Marital Status: Divorced    Tobacco Counseling Counseling given: Not Answered   Clinical Intake:  Pre-visit preparation completed: Yes  Pain : No/denies pain     Nutritional Risks: None Diabetes: Yes CBG done?: No Did pt. bring in CBG monitor from home?: No  How often do you need to have someone help you when you read instructions, pamphlets, or other written materials from your doctor or pharmacy?: 1 - Never  Diabetic?yes Nutrition Risk Assessment:  Has the patient had  any N/V/D within the last 2 months?  No  Does the patient have any non-healing wounds?  No  Has the patient had any unintentional weight loss or weight gain?  No   Diabetes:  Is the patient diabetic?  Yes  If diabetic, was a CBG obtained today?  No  Did the patient bring in their glucometer from home?  No  How often do you monitor your CBG's? Twicex/day.   Financial Strains and Diabetes Management:  Are you having any financial strains with the device, your supplies or your medication? Yes .  Does the patient want to be seen by Chronic Care Management for management of their diabetes?  No  Would the patient like to be referred to a Nutritionist or for Diabetic Management?  No   Diabetic Exams:  Diabetic Eye Exam: Completed 08/06/22. Pt has been advised about the importance in completing this exam.   Diabetic Foot Exam: Completed 08/14/22. Pt has been advised about the importance in completing this exam.  Interpreter Needed?: No  Information entered by :: Kennedy Bucker, LPN   Activities of Daily Living    01/28/2023    9:09 AM 07/13/2022   10:13 AM  In your present state of health, do you have any difficulty performing the following activities:  Hearing? 0 0  Vision? 0 0  Difficulty concentrating or making decisions? 0 0  Walking or climbing stairs? 1 0  Comment left side   Dressing or bathing? 0 0  Doing errands, shopping? 0 0  Preparing Food and eating ? N   Using the Toilet? N   In the past six months, have you accidently leaked urine? N   Do you have problems with loss of bowel control? N   Managing your Medications? N   Managing your Finances? N   Housekeeping or managing your Housekeeping? N     Patient Care Team: Larae Grooms, NP as PCP - General (Nurse Practitioner) Antonieta Iba, MD as PCP - Cardiology (Cardiology)  Indicate any recent Medical Services you may have received from other than [facility] providers in the past year (date may be  approximate).     Assessment:   This is a routine wellness examination for [place].  Hearing/Vision screen Hearing Screening - Comments:: No aids  Vision Screening - Comments:: Wears glasses   Dietary issues and exercise activities discussed: Current Exercise Habits: Home exercise routine, Type of exercise: walking, Time (Minutes): 20, Frequency (Times/Week): 2, Weekly Exercise (Minutes/Week): 40, Intensity: Mild   Goals Addressed             This Visit's Progress    DIET - EAT MORE FRUITS AND VEGETABLES         Depression Screen    01/28/2023    9:06 AM 12/07/2022   10:35 AM 10/05/2022   11:01 AM 08/14/2022    2:21 PM 07/03/2022   11:07 AM 02/13/2022    1:21 PM  PHQ 2/9 Scores  PHQ - 2 Score 0 2 2 3 3  0  PHQ- 9 Score 0 13 16 15 18      Fall Risk    01/28/2023    9:09 AM 12/07/2022   10:34 AM 10/05/2022   11:01 AM 08/14/2022    2:21 PM 07/13/2022   10:12 AM  Fall Risk   Falls in the past year? 0 0 0 0 0  Number falls in past yr: 0 0 0 0 0  Injury with Fall? 0 0 0 0 0  Risk for fall due to : No Fall Risks No Fall Risks No Fall Risks No Fall Risks   Follow up Falls prevention discussed;Falls evaluation completed Falls evaluation completed Falls evaluation completed Falls evaluation completed Falls evaluation completed    FALL RISK PREVENTION PERTAINING TO THE HOME:  Any stairs in or around the home? Yes  If so, are there any without handrails? No  Home free of loose throw rugs in walkways, pet beds, electrical cords, etc? Yes  Adequate lighting in your home to reduce risk of falls? Yes   ASSISTIVE DEVICES UTILIZED TO PREVENT FALLS:  Life alert? No  Use of a cane, walker or w/c? Yes  Grab bars  in the bathroom? No  Shower chair or bench in shower? Yes  Elevated toilet seat or a handicapped toilet? Yes    Cognitive Function:        01/28/2023    9:16 AM  6CIT Screen  What Year? 0 points  What month? 0 points  What time? 0 points  Count back from 20  0 points  Months in reverse 0 points  Repeat phrase 4 points  Total Score 4 points    Immunizations Immunization History  Administered Date(s) Administered   Influenza,inj,Quad PF,6+ Mos 07/23/2013, 10/06/2014, 07/28/2015, 07/17/2016, 08/14/2022   Influenza-Unspecified 08/15/2020   Moderna Sars-Covid-2 Vaccination 09/16/2020   PFIZER(Purple Top)SARS-COV-2 Vaccination 12/15/2019, 01/05/2020   PNEUMOCOCCAL CONJUGATE-20 03/27/2022   PPD Test 03/14/2021   Pneumococcal Polysaccharide-23 11/09/2013   Tdap 11/09/2013    TDAP status: Up to date  Flu Vaccine status: Up to date  Pneumococcal vaccine status: Up to date  Covid-19 vaccine status: Completed vaccines  Qualifies for Shingles Vaccine? Yes   Zostavax completed No   Shingrix Completed?: No.    Education has been provided regarding the importance of this vaccine. Patient has been advised to call insurance company to determine out of pocket expense if they have not yet received this vaccine. Advised may also receive vaccine at local pharmacy or Health Dept. Verbalized acceptance and understanding.  Screening Tests Health Maintenance  Topic Date Due   Zoster Vaccines- Shingrix (1 of 2) Never done   COLONOSCOPY (Pts 45-89yrs Insurance coverage will need to be confirmed)  Never done   PAP SMEAR-Modifier  11/26/2021   COVID-19 Vaccine (4 - 2023-24 season) 06/15/2022   INFLUENZA VACCINE  05/16/2023   HEMOGLOBIN A1C  06/07/2023   OPHTHALMOLOGY EXAM  08/07/2023   FOOT EXAM  08/15/2023   MAMMOGRAM  11/09/2023   DTaP/Tdap/Td (2 - Td or Tdap) 11/10/2023   Diabetic kidney evaluation - eGFR measurement  12/08/2023   Diabetic kidney evaluation - Urine ACR  12/08/2023   Medicare Annual Wellness (AWV)  01/28/2024   Hepatitis C Screening  Completed   HIV Screening  Completed   HPV VACCINES  Aged Out    Health Maintenance  Health Maintenance Due  Topic Date Due   Zoster Vaccines- Shingrix (1 of 2) Never done   COLONOSCOPY (Pts  45-40yrs Insurance coverage will need to be confirmed)  Never done   PAP SMEAR-Modifier  11/26/2021   COVID-19 Vaccine (4 - 2023-24 season) 06/15/2022    Colorectal cancer screening: Type of screening: Colonoscopy. Completed 5 years ago per pt. Repeat every 10 years  Mammogram status: Completed 11/08/22. Repeat every year   Lung Cancer Screening: (Low Dose CT Chest recommended if Age 50-80 years, 30 pack-year currently smoking OR have quit w/in 15years.) does not qualify.    Additional Screening:  Hepatitis C Screening: does qualify; Completed 08/14/22  Vision Screening: Recommended annual ophthalmology exams for early detection of glaucoma and other disorders of the eye. Is the patient up to date with their annual eye exam?  No  Who is the provider or what is the name of the office in which the patient attends annual eye exams? No one If pt is not established with a provider, would they like to be referred to a provider to establish care? No .   Dental Screening: Recommended annual dental exams for proper oral hygiene  Community Resource Referral / Chronic Care Management: CRR required this visit?  No   CCM required this visit?  No  Plan:     I have personally reviewed and noted the following in the patient's chart:   Medical and social history Use of alcohol, tobacco or illicit drugs  Current medications and supplements including opioid prescriptions. Patient is not currently taking opioid prescriptions. Functional ability and status Nutritional status Physical activity Advanced directives List of other physicians Hospitalizations, surgeries, and ER visits in previous 12 months Vitals Screenings to include cognitive, depression, and falls Referrals and appointments  In addition, I have reviewed and discussed with patient certain preventive protocols, quality metrics, and best practice recommendations. A written personalized care plan for preventive services as  well as general preventive health recommendations were provided to patient.     Hal Hope, LPN   [id number]/61/0960   Nurse Notes: none

## 2023-01-30 ENCOUNTER — Telehealth: Payer: Self-pay | Admitting: Pharmacist

## 2023-01-30 NOTE — Progress Notes (Signed)
[facility] Customer service manager [facility])  [facility] Quality Pharmacy Team   Reason for referral: Jardiance PAP  [facility] pharmacy referral is being closed due to the following reasons:  Confirmed this will be handled by the Rx Med Assistance Team since they have already worked with the patient in obtaining Ozempic via PAP.  [facility] is happy to assist the patient/family in the future for clinical pharmacy needs, following a discussion from your team about [facility] outreach. Thank you for allowing [facility] to be a part of your patient's care.   Dellie Burns, PharmD [facility]  [facility] Clinical Pharmacist Office: [phone]

## 2023-02-01 ENCOUNTER — Telehealth: Payer: Self-pay

## 2023-02-01 NOTE — Telephone Encounter (Signed)
Pt came by and picked up the 5 boxes of Ozempic.

## 2023-02-01 NOTE — Telephone Encounter (Signed)
5 boxes of Ozempic received for the patient. Called and LVM letting patient know that her medication was ready to be picked up.

## 2023-02-04 ENCOUNTER — Other Ambulatory Visit (HOSPITAL_COMMUNITY): Payer: Self-pay

## 2023-02-11 ENCOUNTER — Other Ambulatory Visit: Payer: Self-pay | Admitting: Nurse Practitioner

## 2023-02-12 NOTE — Telephone Encounter (Signed)
Refilled 12/07/22 #90 with 1 refill. Requested Prescriptions  Refused Prescriptions Disp Refills   rosuvastatin (CRESTOR) 5 MG tablet [Pharmacy Med Name: ROSUVASTATIN 5MG  TABLETS] 90 tablet 1    Sig: TAKE 1 TABLET(5 MG) BY MOUTH DAILY     Cardiovascular:  Antilipid - Statins 2 Failed - 02/11/2023 10:54 AM      Failed - Lipid Panel in normal range within the last 12 months    Cholesterol, Total  Date Value Ref Range Status  12/07/2022 117 100 - 199 mg/dL Final   LDL Chol Calc (NIH)  Date Value Ref Range Status  12/07/2022 41 0 - 99 mg/dL Final   HDL  Date Value Ref Range Status  12/07/2022 50 >39 mg/dL Final   Triglycerides  Date Value Ref Range Status  12/07/2022 159 (H) 0 - 149 mg/dL Final         Passed - Cr in normal range and within 360 days    Creatinine, Ser  Date Value Ref Range Status  12/07/2022 0.98 0.57 - 1.00 mg/dL Final         Passed - Patient is not pregnant      Passed - Valid encounter within last 12 months    Recent Outpatient Visits           1 month ago Type 2 diabetes mellitus with other specified complication, with long-term current use of insulin (HCC)   [place] [facility] Larae Grooms, NP   2 months ago Essential hypertension   [place] [facility] Larae Grooms, NP   4 months ago Upper respiratory tract infection, unspecified type   [place] [facility] Larae Grooms, NP   6 months ago HFrEF (heart failure with reduced ejection fraction) [facility])   [place] [facility] Larae Grooms, NP   7 months ago Type 2 diabetes mellitus with other specified complication, with long-term current use of insulin [facility])   [place] [facility] Larae Grooms, NP       Future Appointments             In 3 weeks Larae Grooms, NP [place] [facility]   In 3 months Dunn, Raymon Mutton, PA-C  [facility]

## 2023-02-22 ENCOUNTER — Other Ambulatory Visit
Admission: RE | Admit: 2023-02-22 | Discharge: 2023-02-22 | Disposition: A | Discharge status: Home / Self Care | Payer: Medicare Other | Source: Ambulatory Visit | Attending: Nurse Practitioner | Admitting: Nurse Practitioner

## 2023-02-22 ENCOUNTER — Telehealth (HOSPITAL_COMMUNITY): Payer: Self-pay

## 2023-02-22 ENCOUNTER — Ambulatory Visit (HOSPITAL_BASED_OUTPATIENT_CLINIC_OR_DEPARTMENT_OTHER): Payer: Medicare Other | Admitting: Cardiology

## 2023-02-22 ENCOUNTER — Encounter: Payer: Self-pay | Admitting: Cardiology

## 2023-02-22 VITALS — BP 111/72 | HR 82 | Wt 275.6 lb

## 2023-02-22 DIAGNOSIS — I5022 Chronic systolic (congestive) heart failure: Secondary | ICD-10-CM

## 2023-02-22 DIAGNOSIS — M79606 Pain in leg, unspecified: Secondary | ICD-10-CM | POA: Diagnosis not present

## 2023-02-22 LAB — BASIC METABOLIC PANEL
Anion gap: 8 (ref 5–15)
BUN: 12 mg/dL (ref 6–20)
CO2: 29 mmol/L (ref 22–32)
Calcium: 9.2 mg/dL (ref 8.9–10.3)
Chloride: 102 mmol/L (ref 98–111)
Creatinine, Ser: 1.02 mg/dL — ABNORMAL HIGH (ref 0.44–1.00)
GFR, Estimated: >60 mL/min (ref >60)
Glucose, Bld: 134 mg/dL — ABNORMAL HIGH (ref 70–99)
Potassium: 3.3 mmol/L — ABNORMAL LOW (ref 3.5–5.1)
Sodium: 139 mmol/L (ref 135–145)

## 2023-02-22 LAB — BRAIN NATRIURETIC PEPTIDE: B Natriuretic Peptide: 165.6 pg/mL — ABNORMAL HIGH (ref 0.0–100.0)

## 2023-02-22 NOTE — Patient Instructions (Signed)
Routine lab work today. Will notify you of abnormal results  Follow up in 4 months  Your provider requests you have ABI's  (We will contact you to schedule your appointment)  You have been referred to the pharmacy clinic to manage change from ozempic to [facility] (They will contact you to schedule)  Do the following things EVERYDAY: Weigh yourself in the morning before breakfast. Write it down and keep it in a log. Take your medicines as prescribed Eat low salt foods--Limit salt (sodium) to 2000 mg per day.  Stay as active as you can everyday Limit all fluids for the day to less than 2 liters

## 2023-02-22 NOTE — Telephone Encounter (Addendum)
Pt aware, agreeable, and verbalized understanding   ----- Message from Laurey Morale, MD sent at 02/22/2023  1:40 PM EDT ----- Increase K in diet.

## 2023-02-23 NOTE — Progress Notes (Signed)
PCP: Larae Grooms, NP Cardiology: Dr. Mariah Milling HF Cardiology: Dr. Shirlee Latch  62 y.o. with history of OSA, type 2 diabetes, CSF leak requiring VP shunt, and nonischemic cardiomyopathy was referred by Dr. Mariah Milling for CHF clinic evaluation.  Patient's cardiomyopathy was diagnosed in 2018 in [place], cath at that time showed no significant CAD.  She subsequently relocated to [facility].  She was admitted to [facility] in 4/23 with PNA and CHF, echo showed EF 40-45%.  Coronary CTA done in 5/23 showed no CAD, calcium score 0.  Cardiac MRI in 10/23 showed EF 32%, no LGE, RV EF 47%.  Most recent echo in 1/24 showed EF 40-45%, mild LV dilation, normal RV, moderate MR, IVC normal.  Patient does not have a strong family history of cardiomyopathy, her father had CHF but developed after he had renal failure requiring HD.  No drinking or drugs.    CPX in 4/24 showed submaximal study with moderate lung restriction, no significant HF limitation, primarily limited by obesity/deconditioning.   Patient returns for followup of CHF.  She still fatigues easily and cannot exercise much.  She gets short of breath walking up stairs. No chest pain.  Lightheaded if she bends over then stands up too fast.  No orthopnea/PND.  Taking torsemide daily. She has been on semaglutide but has not had the dose increased.  She has not lost weight.  Still has leg pain with both exertion and at rest. Weight up 3 lbs.   Labs (10/23): LDL 91, K 4.4, creatinine 1.61 Labs (2/24): LDL 41, BNP 38, K 3.7, creatinine 1.11  PMH: 1. OSA: CPAP 2. Type 2 diabetes 3. CSF leak: Repairs in 2014 and 2017, VP shunt placed.  4. H/o spinal fusion 5. Chronic systolic CHF: nonischemic cardiomyopathy.  Diagnosed in [place] around 2018.  - Echo (2018): EF 45%.  - LHC (2018): No significant CAD.  - Echo (8/19): EF 45% - Echo (7/22): EF 55% - Echo (4/23): EF 40-45%, normal RV.  - Coronary CTA (5/23): Coronary artery calcium score 0, no CAD.  - Echo (9/23): EF 35-40%, normal RV -  cMRI (10/23): EF 32%, no LGE, RV EF 47%.  - Echo (1/24): EF 40-45%, mild LV dilation, normal RV, moderate MR, IVC normal.  - CPX (4/24): Submaximal with moderate lung restriction in setting of obesity, no significant HF limitation but she has a moderate functional limitation due to obesity/deconditioning.   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  No smoking, ETOH, drugs.   FH: Father with ESRD and CHF, siblings with no cardiac issues.   ROS: All systems reviewed and negative except as per HPI.   Current Outpatient Medications  Medication Sig Dispense Refill   acetaminophen (TYLENOL) 500 MG tablet Take 500 mg by mouth every 6 (six) hours as needed.     aspirin 81 MG chewable tablet Chew 81 mg by mouth daily.     citalopram (CELEXA) 20 MG tablet TAKE 1 TABLET(20 MG) BY MOUTH DAILY 90 tablet 1   empagliflozin (JARDIANCE) 25 MG TABS tablet Take 1 tablet (25 mg total) by mouth daily before breakfast. To replace 10mg - place on hold until patient requests please 90 tablet 1   gabapentin (NEURONTIN) 300 MG capsule Take 300 mg by mouth 3 (three) times daily.     insulin glargine (LANTUS SOLOSTAR) 100 UNIT/ML Solostar Pen Inject 30 Units into the skin daily. 15 mL 1   insulin regular (NOVOLIN R) 100 units/mL injection Inject 0.06 mLs (6 Units total) into the  skin in the morning, at noon, and at bedtime. 10 mL 1   metFORMIN (GLUCOPHAGE) 1000 MG tablet Take 1 tablet (1,000 mg total) by mouth 2 (two) times daily with a meal. 180 tablet 1   omeprazole (PRILOSEC) 20 MG capsule Take 20 mg by mouth daily.     rosuvastatin (CRESTOR) 10 MG tablet Take 1 tablet (10 mg total) by mouth daily. 90 tablet 1   sacubitril-valsartan (ENTRESTO) 97-103 MG Take 1 tablet by mouth 2 (two) times daily.     Semaglutide,0.25 or 0.5MG /DOS, (OZEMPIC, 0.25 OR 0.5 MG/DOSE,) 2 MG/1.5ML SOPN Inject 0.5 mg into the skin once a week. 4 mL 0   spironolactone (ALDACTONE) 25 MG tablet Take 25 mg by mouth daily.     torsemide (DEMADEX)  20 MG tablet Take 2 tablets (40 mg total) by mouth daily for 2 days, THEN 1 tablet (20 mg total) daily. 30 tablet 4   carvedilol (COREG) 25 MG tablet Take 1 tablet (25 mg total) by mouth 2 (two) times daily. 180 tablet 3   No current facility-administered medications for this visit.   BP 111/72   Pulse 82   Wt 275 lb 9.6 oz (125 kg)   SpO2 100%   BMI 38.44 kg/m  General: NAD Neck: No JVD, no thyromegaly or thyroid nodule.  Lungs: Clear to auscultation bilaterally with normal respiratory effort. CV: Nondisplaced PMI.  Heart regular S1/S2, no S3/S4, no murmur.  No peripheral edema.  No carotid bruit.  Normal pedal pulses.  Abdomen: Soft, nontender, no hepatosplenomegaly, no distention.  Skin: Intact without lesions or rashes.  Neurologic: Alert and oriented x 3.  Psych: Normal affect. Extremities: No clubbing or cyanosis.  HEENT: Normal.   Assessment/Plan: 1. Chronic systolic CHF: Nonischemic cardiomyopathy.  Cath in 2018 and coronary CTA in 5/23 showed no coronary disease.  Cardiac MRI in 10/23 showed  EF 32%, no LGE, RV EF 47%, and last echo in 1/24 showed EF 40-45%, mild LV dilation, normal RV, moderate MR, IVC normal.  She does not have a history of ETOH or drug abuse.  She does not have a strong family history of cardiomyopathy.  Dyspnea seems somewhat out of proportion to exam (NYHA class III), CPX in 4/24 suggests that her predominant limitation is obesity/deconditioning.  She is not volume overloaded on exam.  - Continue torsemide 20 mg daily, BMET/BNP today.  - Continue Coreg 25 mg bid.  - Continue empagliflozin 10 daily - Continue Entresto 97/103 bid - Continue spironolactone 25 daily.  - Consider Bidil in future, especially if EF declines.  2. Left leg pain: With ambulation. Difficult to palpate pedal pulses.  This may be sciatica, but think it would be reasonable to check ABIs.  - ABIs ordered today.  3. OSA: Use CPAP.  4. Obesity: Needs significant weight loss.  She has  been on semaglutide but dose has not been increased. She has not lost week.  - I will refer to our pharmacy clinic for management of weight loss meds, ?increase semaglutide versus switch to [facility].   Followup in 4 months.   Emily Hinton 02/23/2023

## 2023-03-01 ENCOUNTER — Other Ambulatory Visit: Payer: Medicare Other

## 2023-03-01 NOTE — Progress Notes (Signed)
   03/01/2023  Patient ID: Emily Hinton, female   DOB: [birth year], 62 y.o.   MRN: [id number]  Subjective/Objective: Patient outreach to check on home BG and BP  DM -Patient recently received Ozempic through PAP program- taking 0.5mg  weekly and tolerating well -Patient never picked up Jardiance 25mg  daily, stating she could not afford the copay -Continues to take Lantus 30 units daily, Metformin 1,000mg  BID, and Novolin R 6 units TID -Requesting Novolin Flexpen versus vials -Reports home FBG: 99, 136  HTN -Home BP: 110/60, 120/70 -Currently taking carvedilol 25mg  BID, Entresto 97-103mg  BID, spironolactone 25mg  daily, and torsemide 20mg  daily -States she is needing refills for Entresto and spironolactone -States torsemide does not seem to be effective- feels like there is fluid on her lungs  Assessment/Plan:  DM -Based on eligibility conversation, patient should qualify for Jardiance PAP; will coordinate with medication assistance team to initiate application process.  Will also see if office has samples of this or Farxiga in the mean time -Pending prescription for Novolin flexpens to replace prescription for vials for PCP to sign if in agreement -Continue to check FBG daily and record  HTN -Forwarding note to cardiology to see if they would like to see patient and/or increase torsemide dose -Calling pharmacy to see if refills remain on Entresto and spironolactone; if not, will request from cardiology -Continue to monitor BP regularly and record  Follow-up: Once Novolin script signed, I will call pharmacy to verify coverage and check on refills and inform patient.  [name], PharmD, DPLA

## 2023-03-04 ENCOUNTER — Telehealth: Payer: Self-pay | Admitting: *Deleted

## 2023-03-04 MED ORDER — NOVOLIN R FLEXPEN 100 UNIT/ML IJ SOPN: 6.0000 [IU] | PEN_INJECTOR | Freq: Three times a day (TID) | INTRAMUSCULAR | 3 refills | Status: DC

## 2023-03-04 NOTE — Telephone Encounter (Signed)
Spoke with patient and reviewed provider request for her to come in for assessment and any further recommendations. She can only come on Fridays and next available Friday is the end of June. Confirmed appointment and date. She verbalized understanding with no further questions at this time.

## 2023-03-04 NOTE — Telephone Encounter (Signed)
-----   Message from Sondra Barges, PA-C sent at 03/02/2023  7:29 AM EDT ----- Patient needs appointment to assess SOB.  ----- Message ----- From: [name], [facility] Sent: 03/01/2023   5:15 PM EDT To: Sondra Barges, PA-C  Telephone visit with patient today to check on home BG and BP, and she is stating she feels like her torsemide is not effective, endorsing a feeling of "fluid on the lungs."  Wanted to inform you in case you wanted to see her sooner or increase torsemide.  Thank you!

## 2023-03-05 ENCOUNTER — Telehealth: Payer: Self-pay

## 2023-03-05 MED ORDER — SPIRONOLACTONE 25 MG PO TABS: 25.0000 mg | ORAL_TABLET | Freq: Every day | ORAL | 0 refills | Status: DC

## 2023-03-05 MED ORDER — ENTRESTO 97-103 MG PO TABS: 1.0000 | ORAL_TABLET | Freq: Two times a day (BID) | ORAL | 0 refills | Status: DC

## 2023-03-05 NOTE — Progress Notes (Unsigned)
   03/05/2023  Patient ID: Emily Hinton, female   DOB: 1960-12-31, 62 y.o.   MRN: [id number]  Subjective/Objective:  Medication Management/Adherence -Contacted [facility] to verify medication status:  1.  No active Entresto or Spironolactone prescriptions on file for the patient  2.  Novolin R (flexpen or vials) require PA on insurance -Able to enroll patient in [employer] foundation for $1000 grant to assist with Jardiance copay -Informed cardiology of patient reported feeling of fluid retention specific to lungs  Assessment/Plan:  Medication Management/Adherence -Pending refills on Entresto and spironolactone for Emily Listen, PA with cards to sign if in agreement -Providing pharmacy with billing information for Emily Hinton to cover Jardiance copay [name] does not cover any regular insulin flexpens, but they do cover Humalog (rapid release) flexpens.  This would be 1:1 conversion for dosing.  Pending prescription for PCP, Emily Hinton to sign if she agrees.  Follow-up:  If Humalog order is signed, will contact patient to inform her of all above information and consult on differences between regular release and immediate release insulin (quicker onset, shorter lasting, necessity to administer with meals to avoid hypoglycemia).  Will also schedule follow-up to check in on home BG, as cards would like to eventually increase Ozempic dose if possible.  [name], PharmD, DPLA

## 2023-03-06 ENCOUNTER — Telehealth: Payer: Self-pay

## 2023-03-06 MED ORDER — INSULIN LISPRO (1 UNIT DIAL) 100 UNIT/ML (KWIKPEN): 6.0000 [IU] | PEN_INJECTOR | Freq: Three times a day (TID) | SUBCUTANEOUS | 11 refills | Status: DC

## 2023-03-06 NOTE — Telephone Encounter (Signed)
-----   Message from [name], [facility] sent at 03/01/2023  5:13 PM EDT ----- Good afternoon!  Could you all initiate the application process for Jardiance PAP for Ms Morrison-Gilzene, please?  Thank you!

## 2023-03-06 NOTE — Progress Notes (Addendum)
   03/06/2023  Patient ID: Emily Hinton, female   DOB: [birth year], 62 y.o.   MRN: [id number]  Subjective/Objective:  Medication Management -Contacted pharmacy to verify receipt and coverage of medications  -Lantus, Entresto, Spironolactone going through on insurance=$211 (lantus $70, Entresto $141 for 90d) -Jardiance no copay on insurance and [employer] -Humalog sent in place of Novolin-informed patient of change and fact that this is rapid release versus short acting and importance of taking with meals -Has Lantus at home- does not need at this time  Assessment/Plan:  Medication Management -Patient plans to pick up one month of Entresto while we work on PAP for moving forward -Will work with medication assistance team and cardiology to initiate PAP process  [name], PharmD, DPLA

## 2023-03-06 NOTE — Progress Notes (Signed)
Error

## 2023-03-06 NOTE — Telephone Encounter (Signed)
Medication signed

## 2023-03-06 NOTE — Telephone Encounter (Signed)
PAP application for JARDIANCE FROM [facility]   has been MAILED to pt home.   I will fax PCP pages once I receive pt pages   Melanee Spry CPhT [facility] [phone]

## 2023-03-07 ENCOUNTER — Other Ambulatory Visit: Payer: Self-pay | Admitting: Nurse Practitioner

## 2023-03-08 ENCOUNTER — Ambulatory Visit (INDEPENDENT_AMBULATORY_CARE_PROVIDER_SITE_OTHER): Payer: Medicare Other | Admitting: Nurse Practitioner

## 2023-03-08 ENCOUNTER — Other Ambulatory Visit (HOSPITAL_COMMUNITY): Payer: Self-pay

## 2023-03-08 ENCOUNTER — Encounter: Payer: Self-pay | Admitting: Nurse Practitioner

## 2023-03-08 ENCOUNTER — Telehealth: Payer: Self-pay

## 2023-03-08 VITALS — BP 134/71 | HR 86 | Temp 98.4°F | Wt 267.0 lb

## 2023-03-08 DIAGNOSIS — Z794 Long term (current) use of insulin: Secondary | ICD-10-CM | POA: Diagnosis not present

## 2023-03-08 DIAGNOSIS — M79605 Pain in left leg: Secondary | ICD-10-CM

## 2023-03-08 DIAGNOSIS — E1169 Type 2 diabetes mellitus with other specified complication: Secondary | ICD-10-CM | POA: Diagnosis not present

## 2023-03-08 DIAGNOSIS — F419 Anxiety disorder, unspecified: Secondary | ICD-10-CM

## 2023-03-08 DIAGNOSIS — I5020 Unspecified systolic (congestive) heart failure: Secondary | ICD-10-CM

## 2023-03-08 DIAGNOSIS — I10 Essential (primary) hypertension: Secondary | ICD-10-CM | POA: Diagnosis not present

## 2023-03-08 DIAGNOSIS — F339 Major depressive disorder, recurrent, unspecified: Secondary | ICD-10-CM

## 2023-03-08 DIAGNOSIS — E781 Pure hyperglyceridemia: Secondary | ICD-10-CM

## 2023-03-08 MED ORDER — GABAPENTIN 300 MG PO CAPS: 300.0000 mg | ORAL_CAPSULE | Freq: Three times a day (TID) | ORAL | 1 refills | Status: DC

## 2023-03-08 NOTE — Assessment & Plan Note (Signed)
Chronic.  Controlled.  Continue with current medication regimen of Carvedilol and Valsartan (Entresto).  Labs ordered today.  Return to clinic in 3 months for reevaluation.  Call sooner if concerns arise.   

## 2023-03-08 NOTE — Telephone Encounter (Signed)
PAP application for ENTRESTO (NORVARTIS)   has been mailed to pt home.  I will fax PCP pages once I receive pt pages.    Georga Bora Rx [facility] [phone]

## 2023-03-08 NOTE — Assessment & Plan Note (Addendum)
Chronic. Not well controlled.  Working to get [employer] back.  Patient on Lantus 30u daily.  Humalog 6u TID.  Endorses sugars of 110-136.  Question accuracy given last A1c was 10.2%.  Up to date on eye exam.  Labs ordered at visit today.  Follow up in 3 months.  Call sooner if concerns arise.

## 2023-03-08 NOTE — Assessment & Plan Note (Signed)
Recommended eating smaller high protein, low fat meals more frequently and exercising 30 mins a day 5 times a week with a goal of 10-15lb weight loss in the next 3 months.  

## 2023-03-08 NOTE — Assessment & Plan Note (Signed)
Chronic.  Controlled.  Continue with current medication regimen of Celexa.  Labs ordered today.  Return to clinic in 6 months for reevaluation.  Call sooner if concerns arise.   

## 2023-03-08 NOTE — Assessment & Plan Note (Signed)
Chronic.  Followed by HF clinic.  Echo report from 06/21/22 reviewed and showed an EF of 35-40% with mild MR.  Patient is not weighing daily.  Encouraged her to weigh herself.  Working to get Jardiance back for patient via PAP.  She is working with the clinical pharmacist for medication optimization. - Reminded to call for an overnight weight gain of >2 pounds or a weekly weight gain of >5 pounds - not adding salt to food and read food labels. Reviewed the importance of keeping daily sodium intake to 2000mg  daily. - Avoid Ibuprofen products.

## 2023-03-08 NOTE — Progress Notes (Signed)
BP 134/71   Pulse 86   Temp 98.4 F (36.9 C) (Oral)   Wt 267 lb (121.1 kg)   SpO2 98%   BMI 37.24 kg/m    Subjective:    Patient ID: Emily Hinton, female    DOB: 02/17/1961, 62 y.o.   MRN: [id number]  HPI: Emily Hinton is a 62 y.o. female  Chief Complaint  Patient presents with   Diabetes   HYPERTENSION / HYPERLIPIDEMIA/HF Not weighing daily.  Torsemide was changed to daily.  Patient is not taking Jardiance- she is working on PAP.  Working with Teacher, music.   Satisfied with current treatment? yes Duration of hypertension: years BP monitoring frequency: daily BP range: 120/70 BP medication side effects: no Past BP meds: carvedilol and valsartan Duration of hyperlipidemia: years Cholesterol medication side effects: no Cholesterol supplements: none Past cholesterol medications: rosuvastatin (crestor) Medication compliance: excellent compliance Aspirin: no Recent stressors: no Recurrent headaches: no Visual changes: no Palpitations: no Dyspnea: no Chest pain: no Lower extremity edema: no Dizzy/lightheaded: no  DIABETES Patient states she is taking Ozempic 0.5mg  and tolerating it well.  She does not tolerate higher doses of Ozempic.   Hypoglycemic episodes:no Polydipsia/polyuria: no Visual disturbance: no Chest pain: no Paresthesias: no Glucose Monitoring: yes  Accucheck frequency: Daily  Fasting glucose: 110-136  Post prandial:  Evening:  Before meals: Taking Insulin?: yes  Long acting insulin: Lantus 30u  Short acting insulin: Humalog 6u TID- has been out of medication. Blood Pressure Monitoring: daily Retinal Examination: Up to Date Foot Exam:  up to date Diabetic Education: Not Completed Pneumovax: Up to Date Influenza: Up to Date Aspirin: no  VP SHUNT Has seen Neurology.  Hasn't seen Neurology recently. Referral previously placed.  DEPRESSION Patient feels like the Celexa is helping her mood.  Doing well  with the medication.  She feels like she is mostly quiet but doing okay.    LEG PAIN Patient states she is have left leg pain.  It shoots down her leg and prevents her from sleeping.  States the gabapentin doesn't really help the pain.   Relevant past medical, surgical, family and social history reviewed and updated as indicated. Interim medical history since our last visit reviewed. Allergies and medications reviewed and updated.  Review of Systems  HENT:  Nosebleeds: cmp.   Eyes:  Negative for visual disturbance.  Respiratory:  Negative for cough, chest tightness and shortness of breath.   Cardiovascular:  Negative for chest pain, palpitations and leg swelling.  Endocrine: Negative for polydipsia and polyuria.  Musculoskeletal:        Left leg pain  Neurological:  Negative for dizziness, numbness and headaches.  Psychiatric/Behavioral:  Positive for dysphoric mood. Negative for suicidal ideas. The patient is nervous/anxious.     Per HPI unless specifically indicated above     Objective:    BP 134/71   Pulse 86   Temp 98.4 F (36.9 C) (Oral)   Wt 267 lb (121.1 kg)   SpO2 98%   BMI 37.24 kg/m   Wt Readings from Last 3 Encounters:  03/08/23 267 lb (121.1 kg)  02/22/23 275 lb 9.6 oz (125 kg)  01/28/23 278 lb (126.1 kg)    Physical Exam Vitals and nursing note reviewed.  Constitutional:      General: She is not in acute distress.    Appearance: Normal appearance. She is obese. She is not ill-appearing, toxic-appearing or diaphoretic.  HENT:     Head: Normocephalic.  Right Ear: External ear normal.     Left Ear: External ear normal.     Nose: Nose normal.     Mouth/Throat:     Mouth: Mucous membranes are moist.     Pharynx: Oropharynx is clear.  Eyes:     General:        Right eye: No discharge.        Left eye: No discharge.     Extraocular Movements: Extraocular movements intact.     Conjunctiva/sclera: Conjunctivae normal.     Pupils: Pupils are equal,  round, and reactive to light.  Cardiovascular:     Rate and Rhythm: Normal rate and regular rhythm.     Heart sounds: No murmur heard. Pulmonary:     Effort: Pulmonary effort is normal. No respiratory distress.     Breath sounds: Normal breath sounds. No wheezing or rales.  Musculoskeletal:     Cervical back: Normal range of motion and neck supple.  Skin:    General: Skin is warm and dry.     Capillary Refill: Capillary refill takes less than 2 seconds.  Neurological:     General: No focal deficit present.     Mental Status: She is alert and oriented to person, place, and time. Mental status is at baseline.  Psychiatric:        Mood and Affect: Mood normal.        Behavior: Behavior normal.        Thought Content: Thought content normal.        Judgment: Judgment normal.     Results for orders placed or performed during the hospital encounter of 02/22/23  B Nat Peptide  Result Value Ref Range   B Natriuretic Peptide 165.6 (H) 0.0 - 100.0 pg/mL  Basic Metabolic Panel (BMET)  Result Value Ref Range   Sodium 139 135 - 145 mmol/L   Potassium 3.3 (L) 3.5 - 5.1 mmol/L   Chloride 102 98 - 111 mmol/L   CO2 29 22 - 32 mmol/L   Glucose, Bld 134 (H) 70 - 99 mg/dL   BUN 12 6 - 20 mg/dL   Creatinine, Ser 1.19 (H) 0.44 - 1.00 mg/dL   Calcium 9.2 8.9 - 14.7 mg/dL   GFR, Estimated >82 >95 mL/min   Anion gap 8 5 - 15      Assessment & Plan:   Problem List Items Addressed This Visit       Cardiovascular and Mediastinum   Essential hypertension    Chronic.  Controlled.  Continue with current medication regimen of Carvedilol and Valsartan (Entresto).  Labs ordered today.  Return to clinic in 3 months for reevaluation.  Call sooner if concerns arise.        Relevant Orders   Comp Met (CMET)   HFrEF (heart failure with reduced ejection fraction) (HCC) - Primary    Chronic.  Followed by HF clinic.  Echo report from 06/21/22 reviewed and showed an EF of 35-40% with mild MR.  Patient is  not weighing daily.  Encouraged her to weigh herself.  Working to get Jardiance back for patient via PAP.  She is working with the clinical pharmacist for medication optimization. - Reminded to call for an overnight weight gain of >2 pounds or a weekly weight gain of >5 pounds - not adding salt to food and read food labels. Reviewed the importance of keeping daily sodium intake to 2000mg  daily. - Avoid Ibuprofen products.  Endocrine   Diabetes mellitus (HCC)    Chronic. Not well controlled.  Working to get [employer] back.  Patient on Lantus 30u daily.  Humalog 6u TID.  Endorses sugars of 110-136.  Question accuracy given last A1c was 10.2%.  Up to date on eye exam.  Labs ordered at visit today.  Follow up in 3 months.  Call sooner if concerns arise.       Relevant Orders   HgB A1c     Other   Morbid obesity (HCC)    Recommended eating smaller high protein, low fat meals more frequently and exercising 30 mins a day 5 times a week with a goal of 10-15lb weight loss in the next 3 months.       Hypertriglyceridemia    Labs ordered at visit today.  Will make recommendations based on lab results.  Continue Crestor 10mg .  Will increase dose if needed based on lab results.  Follow up in 3 months.  Call sooner if concerns arise.       Relevant Orders   Lipid Profile   Depression, recurrent (HCC)    Chronic.  Controlled.  Continue with current medication regimen of Celexa.  Labs ordered today.  Return to clinic in 6 months for reevaluation.  Call sooner if concerns arise.        Anxiety    Chronic.  Controlled.  Continue with current medication regimen of Celexa.  Labs ordered today.  Return to clinic in 6 months for reevaluation.  Call sooner if concerns arise.       Other Visit Diagnoses     Left leg pain       Relevant Orders   Ambulatory referral to Orthopedic Surgery        Follow up plan: Return in about 3 months (around 06/08/2023) for HTN, HLD, DM2 FU.

## 2023-03-08 NOTE — Telephone Encounter (Signed)
-----   Message from [name], [facility] sent at 03/08/2023  7:43 AM EDT ----- Good morning!  Patient would qualify for Entresto PAP; can you please initiate the application process for her?  Provider is Eula Listen, PA with cardiology.

## 2023-03-08 NOTE — Assessment & Plan Note (Signed)
Labs ordered at visit today.  Will make recommendations based on lab results.  Continue Crestor 10mg .  Will increase dose if needed based on lab results.  Follow up in 3 months.  Call sooner if concerns arise.

## 2023-03-09 LAB — COMPREHENSIVE METABOLIC PANEL
ALT: 17 IU/L (ref 0–32)
AST: 13 IU/L (ref 0–40)
Albumin/Globulin Ratio: 1.4 (ref 1.2–2.2)
Albumin: 4.4 g/dL (ref 3.8–4.9)
Alkaline Phosphatase: 69 IU/L (ref 44–121)
BUN/Creatinine Ratio: 13 (ref 12–28)
BUN: 14 mg/dL (ref 8–27)
Bilirubin Total: 0.4 mg/dL (ref 0.0–1.2)
CO2: 25 mmol/L (ref 20–29)
Calcium: 9.7 mg/dL (ref 8.7–10.3)
Chloride: 100 mmol/L (ref 96–106)
Creatinine, Ser: 1.12 mg/dL — ABNORMAL HIGH (ref 0.57–1.00)
Globulin, Total: 3.1 g/dL (ref 1.5–4.5)
Glucose: 133 mg/dL — ABNORMAL HIGH (ref 70–99)
Potassium: 3.8 mmol/L (ref 3.5–5.2)
Sodium: 143 mmol/L (ref 134–144)
Total Protein: 7.5 g/dL (ref 6.0–8.5)
eGFR: 56 mL/min/{1.73_m2} — ABNORMAL LOW (ref >59)

## 2023-03-09 LAB — LIPID PANEL
Chol/HDL Ratio: 2.1 ratio (ref 0.0–4.4)
Cholesterol, Total: 100 mg/dL (ref 100–199)
HDL: 47 mg/dL (ref >39)
LDL Chol Calc (NIH): 35 mg/dL (ref 0–99)
Triglycerides: 95 mg/dL (ref 0–149)
VLDL Cholesterol Cal: 18 mg/dL (ref 5–40)

## 2023-03-09 LAB — HEMOGLOBIN A1C
Est. average glucose Bld gHb Est-mCnc: 137 mg/dL
Hgb A1c MFr Bld: 6.4 % — ABNORMAL HIGH (ref 4.8–5.6)

## 2023-03-12 NOTE — Progress Notes (Signed)
Please let patient know that her A1c improved to 6.4%.  This is great news.  Her hard work is paying off. Continue with current medication regimen.  Follow up as discussed.

## 2023-03-25 ENCOUNTER — Telehealth: Payer: Self-pay

## 2023-03-25 NOTE — Progress Notes (Signed)
   03/25/2023  Patient ID: Emily Hinton, female   DOB: 02-Dec-1960, 62 y.o.   MRN: [id number]  Patient returning my call from earlier.  PAP application for Sherryll Burger was mailed by medication assistance team on 03/08/23, but patient states she has not received this.  Verified address we have on file is correct and up to day.  Will request that another application be sent to the patient.  [name], PharmD, DPLA

## 2023-03-25 NOTE — Progress Notes (Signed)
   03/25/2023  Patient ID: Emily Hinton, female   DOB: 1961/06/28, 62 y.o.   MRN: [id number]  Patient outreach to check on status of Entresto PAP application.  Application was mailed to patient's home 03/08/23.  Unable tor each her, so HIPAA compliant voicemail left with my direct number to call at her convenience.  [name], PharmD, DPLA

## 2023-03-26 NOTE — Telephone Encounter (Signed)
PAP application for ENTRESTO (NORVARTIS)    has been  RE-mailed to pt home.  I will fax PCP pages once I receive pt pages.      Georga Bora Rx Patient Advocate [phone]

## 2023-03-26 NOTE — Telephone Encounter (Signed)
We ended up doing [employer] for her [place], so she should not need PAP

## 2023-04-10 NOTE — Progress Notes (Signed)
Cardiology Office Note    Date:  04/12/2023   ID:  Talley Polachek- [name], DOB [birth year], MRN [id number]  PCP:  Larae Grooms, NP  Cardiologist:  Julien Nordmann, MD  Electrophysiologist:  None   Chief Complaint: Follow up  History of Present Illness:   Emily Hinton is a 62 y.o. 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   Emily Hinton was previously followed by cardiology in [place]. She was found to have a cardiomyopathy in 2018 with an EF of 45%. LHC at that time showed nonobstructive coronary arteries.  Echo in 05/2018 demonstrated a persistent cardiomyopathy with an EF of 45%, global hypokinesis, and diastolic dysfunction. This finding was unchanged when compared to echo in 2018.  Subsequent echo in 04/2021 showed an improved LV systolic function with an EF of 55%, probable normal LV wall motion, and diastolic dysfunction. She moved to [facility] from [place] several years ago.    She was admitted to the hospital in 01/2022 with worsening dyspnea and productive cough and found to have multilobar pneumonia with mild volume overload.  She was hypertensive and tachycardic upon presentation with an oxygen saturation of 72% on room air.  High-sensitivity troponin 35 with a delta troponin of 20.  BNP 189.  Chest x-ray showed volume overload versus pneumonia with small pleural effusions.  CT of the chest showed multilobar pneumonia with minimal pleural fluid on the left.  Echo during this admission showed an EF of 40-45%, mildly to moderately dilated LV internal cavity size, global hypokinesis, Gr2DD, normal RV systolic function  and ventricular cavity size, and mild mitral regurgitation.     Coronary CTA on 03/01/2022 showed a calcium score of 0, with no evidence of CAD.  Noncardiac over read was notable for near complete resolution of groundglass opacities when compared to imaging from 01/2022.  There were bandlike changes along the posterior pleural space felt to likely reflect a mixture of atelectasis and postinflammatory changes that were also improving.  It was recommended she follow-up with her PCP for these improving changes.   She was seen in the office on 04/12/2022 and was without symptoms of angina or decompensation.  She had stable 2-3 pillow orthopnea given prior CSF leak.  She remained on Entresto and had been started on lisinopril by outside office.  It was recommended that she discontinue lisinopril.  Amlodipine was also discontinued and Entresto was titrated to 49/51 mg twice daily.  She was otherwise continued on carvedilol, spironolactone, torsemide, and Jardiance.   She was seen in the office on 05/17/2022 and continued to do well from a cardiac perspective, without symptoms of angina or decompensation.  Her weight was stable by our scale.  Entresto was titrated to 97/103 mg twice daily and carvedilol was titrated to 12.5 mg twice daily.  She was otherwise continued on Jardiance, spironolactone, and torsemide.  Labs showed mild AKI at that time leading torsemide to be transitioned to as needed.  Follow-up limited echo 06/21/2022 showed a persistent cardiomyopathy with an EF of 35 to 40%, global hypokinesis, mildly dilated internal LV cavity size, grade 1 diastolic dysfunction, normal RV systolic function and ventricular cavity  size, mild mitral regurgitation, and an estimated right atrial pressure of 3 mmHg.     She was seen in the office on 07/05/2022 and remained without symptoms of angina or decompensation with stable chronic dyspnea.  Her weight was stable.  Carvedilol was titrated to 25 mg daily with continuation of  Entresto, Jardiance, spironolactone, and torsemide.  Cardiac MRI on 07/18/2022 demonstrated an LVEF of 32% with no evidence of MI, scar, or LGE with normal RV systolic function and ventricular cavity size and no significant valvular abnormalities.  Findings were suggestive of nonischemic cardiomyopathy.   I last saw her in 10/2022, at which time she was without symptoms of angina or cardiac decompensation.  Given persistent cardiomyopathy despite optimization of GDMT, she was referred to the advanced heart failure service and evaluated by them on 11/30/2022, at which time she was noted to have a mildly elevated ReDs vest of 36%.  Her dyspnea was felt to be somewhat out of proportion to her exam.  Torsemide was briefly titrated.  CPX was a submaximal test with the resting spirometry suggestive of moderate restrictive lung function in the setting of severe obesity.  Exercise testing revealed a moderate functional limitation due to primarily obesity and deconditioning.  There was mild chronotropic incompetence in the setting of submaximal aerobic effort with no other evidence of significant heart failure limitation.  She followed up with advanced heart failure on 02/22/2023 with continued easy fatigability and shortness of breath when walking upstairs.  She was without frank angina.  She had been on semaglutide, though had not increased the dose and has not lost weight.  Her weight was up 3 pounds by their scale, though was not volume up.  Her dyspnea was out of proportion to her exam with findings suggestive of main limitation being obesity and deconditioning.  She was referred to our pharmacy team for bariatric management.  She comes in doing well from a cardiac perspective and is without symptoms of angina or cardiac decompensation.  She continues to note stable chronic fatigue and dyspnea.  Stable 3 pillow orthopnea in the setting of VP shunt.  Her weight is down 3 pounds by our scale when compared to her last  visit with [place] in 01/2023.  No progressive abdominal distention or lower extremity swelling.  She continues to note sharp pain along the left lower extremity that is worse at night and improves with pressure.  She has attributed this to prior brain surgery.  No symptoms of claudication or nonhealing wounds.   Labs independently reviewed: 02/2023 - A1c 6.4, TC 100, TG 95, HDL 47, LDL 35, BUN 14, serum renin 1.12, potassium 3.8, albumin 4.4, AST/ALT normal, BNP 165 11/2022 - A1c 10.1 09/2021 - TSH normal, magnesium 2.0   Past Medical History:  Diagnosis Date   CHF (congestive heart failure) (HCC)    CSF leak    Diabetes mellitus without complication (HCC)    DM2 (diabetes mellitus, type 2) (HCC)    Essential hypertension    Heart attack (HCC)    Hyperlipidemia    Morbid obesity (HCC)    NICM (nonischemic cardiomyopathy) (HCC)    Nonobstructive atherosclerosis of coronary artery    OSA (obstructive sleep apnea)     Past Surgical History:  Procedure Laterality Date   CERVICAL FUSION  2017   CSF leak repair x 2     2014 and 2017   CSF SHUNT  2017    Current Medications: Current Meds  Medication Sig  acetaminophen (TYLENOL) 500 MG tablet Take 500 mg by mouth every 6 (six) hours as needed.   aspirin 81 MG chewable tablet Chew 81 mg by mouth daily.   carvedilol (COREG) 25 MG tablet Take 1 tablet (25 mg total) by mouth 2 (two) times daily.   gabapentin (NEURONTIN) 300 MG capsule Take 1 capsule (300 mg total) by mouth 3 (three) times daily.   insulin glargine (LANTUS SOLOSTAR) 100 UNIT/ML Solostar Pen Inject 30 Units into the skin daily.   insulin lispro (HUMALOG KWIKPEN) 100 UNIT/ML KwikPen Inject 6 Units into the skin 3 (three) times daily. With meals.  This replaces Novolin R.   metFORMIN (GLUCOPHAGE) 1000 MG tablet Take 1 tablet (1,000 mg total) by mouth 2 (two) times daily with a meal.   omeprazole (PRILOSEC) 20 MG capsule Take 20 mg by mouth daily.   rosuvastatin (CRESTOR) 10 MG  tablet Take 1 tablet (10 mg total) by mouth daily.   sacubitril-valsartan (ENTRESTO) 97-103 MG Take 1 tablet by mouth 2 (two) times daily.   Semaglutide,0.25 or 0.5MG /DOS, (OZEMPIC, 0.25 OR 0.5 MG/DOSE,) 2 MG/1.5ML SOPN Inject 0.5 mg into the skin once a week.   spironolactone (ALDACTONE) 25 MG tablet Take 1 tablet (25 mg total) by mouth daily.   torsemide (DEMADEX) 20 MG tablet Take 2 tablets (40 mg total) by mouth daily for 2 days, THEN 1 tablet (20 mg total) daily.    Allergies:   Cashew nut (anacardium occidentale) skin test and Cashew nut oil   Social History   Socioeconomic History   Marital status: Single    Spouse name: Not on file   Number of children: Not on file   Years of education: Not on file   Highest education level: Not on file  Occupational History   Not on file  Tobacco Use   Smoking status: Never   Smokeless tobacco: Never  Vaping Use   Vaping Use: Never used  Substance and Sexual Activity   Alcohol use: Never   Drug use: Never   Sexual activity: Not on file  Other Topics Concern   Not on file  Social History Narrative   Not on file   Social Determinants of Health   Financial Resource Strain: Medium Risk (01/28/2023)   Overall Financial Resource Strain (CARDIA)    Difficulty of Paying Living Expenses: Somewhat hard  Food Insecurity: No Food Insecurity (01/28/2023)   Hunger Vital Sign    Worried About Running Out of Food in the Last Year: Never true    Ran Out of Food in the Last Year: Never true  Transportation Needs: No Transportation Needs (01/28/2023)   PRAPARE - Administrator, Civil Service (Medical): No    Lack of Transportation (Non-Medical): No  Physical Activity: Insufficiently Active (01/28/2023)   Exercise Vital Sign    Days of Exercise per Week: 2 days    Minutes of Exercise per Session: 20 min  Stress: No Stress Concern Present (01/28/2023)   [employer] of Occupational Health - Occupational Stress Questionnaire     Feeling of Stress : Not at all  Social Connections: Moderately Isolated (01/28/2023)   Social Connection and Isolation Panel [NHANES]    Frequency of Communication with Friends and Family: More than three times a week    Frequency of Social Gatherings with Friends and Family: Three times a week    Attends Religious Services: 1 to 4 times per year    Active Member of Clubs or Organizations: No  Attends Banker Meetings: Never    Marital Status: Divorced     Family History:  The patient's family history includes Heart failure in her father; Hypertension in her father; Kidney disease in her father. There is no history of Breast cancer.  ROS:   12-point review of systems is negative unless otherwise noted in the HPI.   EKGs/Labs/Other Studies Reviewed:    Studies reviewed were summarized above. The additional studies were reviewed today:  2D echo 01/30/2022: 1. Left ventricular ejection fraction, by estimation, is 40 to 45%. The  left ventricle has mildly decreased function. The left ventricle  demonstrates global hypokinesis. The left ventricular internal cavity size  was mildly to moderately dilated. Left  ventricular diastolic parameters are consistent with Grade II diastolic  dysfunction (pseudonormalization). Elevated left atrial pressure.   2. Right ventricular systolic function is normal. The right ventricular  size is normal. Tricuspid regurgitation signal is inadequate for assessing  PA pressure.   3. The mitral valve is normal in structure. Mild mitral valve  regurgitation. No evidence of mitral stenosis.   4. The aortic valve was not well visualized. Aortic valve regurgitation  is not visualized. No aortic stenosis is present. __________   Coronary CTA 03/01/2022: Noncardiac over read -  1. Near complete resolution of ground-glass opacities in the chest when compared to the recent study from April of 2023. 2. Bandlike changes along the posterior pleural  surface likely a mixture of atelectasis and post inflammatory changes also showing improvement since previous imaging. This may reflect developing post inflammatory fibrosis with and is mixed atelectatic changes at the lung bases, suggest attention on follow-up.   Cardiac overread -  Aorta:  Normal size.  No calcifications.  No dissection.   Aortic Valve:  Trileaflet.  No calcifications.   Coronary Arteries:  Normal coronary origin.  Right dominance.   RCA is a dominant artery that gives rise to PDA and PLA. There is no plaque.   Left main gives rise to LAD and LCX arteries. There is no LM disease.   LAD has no plaque.   LCX is a non-dominant artery that gives rise to a large obtuse marginal branch. There is no plaque.   Other findings:   Normal pulmonary vein drainage into the left atrium.   Normal left atrial appendage without a thrombus.   Normal size of the pulmonary artery.   IMPRESSION: 1. Normal coronary calcium score of 0. Patient is low risk for coronary events. 2. Normal coronary origin with right dominance. 3. No evidence of CAD. 4. CAD-RADS 0. Consider non-atherosclerotic causes of chest pain. __________   Limited echo 06/21/2022: 1. Left ventricular ejection fraction, by estimation, is 35 to 40%. The  left ventricle has moderately decreased function. The left ventricle  demonstrates global hypokinesis. The left ventricular internal cavity size  was mildly dilated. Left ventricular  diastolic parameters are consistent with Grade I diastolic dysfunction  (impaired relaxation).   2. Right ventricular systolic function is normal. The right ventricular  size is normal. Tricuspid regurgitation signal is inadequate for assessing  PA pressure.   3. The mitral valve is normal in structure. Mild mitral valve  regurgitation. No evidence of mitral stenosis.   4. The aortic valve is normal in structure. Aortic valve regurgitation is  not visualized. No aortic  stenosis is present.   5. The inferior vena cava is normal in size with greater than 50%  respiratory variability, suggesting right atrial pressure of 3 mmHg.  __________   Cardiac MRI 07/18/2022: FINDINGS: 1. Mildly dilated left ventricular size. Moderately reduced LV systolic function (LVEF = 32%). There is global hypokinesis with no regional wall motion abnormalities.   There is no late gadolinium enhancement in the left ventricular myocardium.   LVEDV: 276 ml   LVESV: 187 ml   SV: 89 ml   CO: 4.9 L/min   Myocardial mass: 152   2. Normal right ventricular size, thickness and low normal systolic function (RVEF = 47%). There are no regional wall motion abnormalities.   3.  Normal left and right atrial size.   4. Normal size of the aortic root, ascending aorta and pulmonary artery. Normal pulmonic and aortic flow. Qp:Qs = 1.05   5.  Trace MR, Trace TR. no other significant valvular abnormalities.   6.  Normal pericardium.  No pericardial effusion.   IMPRESSION: 1.  Mildly dilated LV, moderately reduced LV function.  LVEF = 32% 2. No evidence for myocardial infiltration, late gadolinium enhancement or scar. 3.  No significant valvular abnormalities. 4.  Normal RV size and function 5.  Findings suggest non-ischemic cardiomyopathy. __________   Limited echo 11/02/2022: 1. Left ventricular ejection fraction, by estimation, is 40 to 45%. Left  ventricular ejection fraction by 3D volume is 43 %. The left ventricle has  mildly decreased function. The left ventricle demonstrates global  hypokinesis. The left ventricular  internal cavity size was mildly dilated. Left ventricular diastolic  parameters are indeterminate. The average left ventricular global  longitudinal strain is -8.5 %. The global longitudinal strain is abnormal.   2. Right ventricular systolic function is normal. The right ventricular  size is normal.   3. Left atrial size was mildly dilated.   4. The  mitral valve is normal in structure. Moderate mitral valve  regurgitation. No evidence of mitral stenosis.   5. The aortic valve is normal in structure. Aortic valve regurgitation is  not visualized. No aortic stenosis is present.   6. The inferior vena cava is normal in size with greater than 50%  respiratory variability, suggesting right atrial pressure of 3 mmHg.   Comparison(s): 06/21/2022-EF 35-40%.    EKG:  EKG is ordered today.  The EKG ordered today demonstrates NSR, 81 bpm, left axis deviation, LAFB, poor R wave progression along the precordial leads, improved lateral ST-T changes  Recent Labs: 02/22/2023: B Natriuretic Peptide 165.6 03/08/2023: ALT 17; BUN 14; Creatinine, Ser 1.12; Potassium 3.8; Sodium 143  Recent Lipid Panel    Component Value Date/Time   CHOL 100 03/08/2023 1015   TRIG 95 03/08/2023 1015   HDL 47 03/08/2023 1015   CHOLHDL 2.1 03/08/2023 1015   LDLCALC 35 03/08/2023 1015    PHYSICAL EXAM:    VS:  BP 110/70 (BP Location: Left Arm, Patient Position: Sitting, Cuff Size: Large)   Pulse 81   Ht 5\' 11"  (1.803 m)   Wt 275 lb 9.6 oz (125 kg)   SpO2 98%   BMI 38.44 kg/m   BMI: Body mass index is 38.44 kg/m.  Physical Exam Vitals reviewed.  Constitutional:      Appearance: She is well-developed.  HENT:     Head: Normocephalic and atraumatic.  Eyes:     General:        Right eye: No discharge.        Left eye: No discharge.  Neck:     Vascular: No JVD.  Cardiovascular:     Rate and Rhythm: Normal rate and regular rhythm.  Heart sounds: Normal heart sounds, S1 normal and S2 normal. Heart sounds not distant. No midsystolic click and no opening snap. No murmur heard.    No friction rub.  Pulmonary:     Effort: Pulmonary effort is normal. No respiratory distress.     Breath sounds: Normal breath sounds. No decreased breath sounds, wheezing or rales.  Chest:     Chest wall: No tenderness.  Abdominal:     General: There is no distension.      Palpations: Abdomen is soft.     Tenderness: There is no abdominal tenderness.  Musculoskeletal:     Cervical back: Normal range of motion.     Right lower leg: No edema.     Left lower leg: No edema.  Skin:    General: Skin is warm and dry.     Nails: There is no clubbing.  Neurological:     Mental Status: She is alert and oriented to person, place, and time.  Psychiatric:        Speech: Speech normal.        Behavior: Behavior normal.        Thought Content: Thought content normal.        Judgment: Judgment normal.     Wt Readings from Last 3 Encounters:  04/12/23 275 lb 9.6 oz (125 kg)  03/08/23 267 lb (121.1 kg)  02/22/23 275 lb 9.6 oz (125 kg)     ASSESSMENT & PLAN:   HFrEF secondary to NICM: She is euvolemic and well compensated with NYHA class II symptoms.  She remains on carvedilol 25 mg twice daily, Jardiance 25 mg daily, Entresto 97/23 mg twice daily, spironolactone 25 mg daily, and torsemide 20 mg daily.  If she develops progressive symptoms could consider BiDil.  Persistent dyspnea is out of proportion to exam with CPX earlier this year demonstrating predominant limitation of obesity and deconditioning.  Cannot exclude some pulmonary component as well with possible postinflammatory fibrosis noted on CT last year.  Follow-up noncontrast chest CT for further evaluation.  Continue to follow-up with advanced heart failure service.  Nonobstructive CAD: No symptoms suggestive of angina. Recent coronary CTA showed calcium score of 0 with no evidence of CAD. Continue aggressive risk factor modification and primary prevention including aspirin, carvedilol, and rosuvastatin. No indication for further ischemic testing at this time.   HTN: Blood pressure is well-controlled in the office today.  Continue medical therapy as outlined above.  Abnormal chest CT: Noncardiac over read of coronary CTA showed improving inflammatory changes as well as near complete resolution of groundglass  opacities.  Changes possibly reflective of postinflammatory fibrosis.  Schedule follow-up chest CT without contrast.  Class II obesity: Her weight is down 3 pounds by our scale when compared to her last visit in our office in 01/2023.  She has been on semaglutide, though has not noted significant weight loss.  Advanced heart failure service has referred her to our pharmacy team for weight management.  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  Less consistent with claudication.  ABIs pending.  OSA: CPAP.   Disposition: F/u with Dr. Mariah Milling or an APP in 6 months.   Medication Adjustments/Labs and Tests Ordered: Current medicines are reviewed at length with the patient today.  Concerns regarding medicines are outlined above. Medication changes, Labs and Tests ordered today are summarized above and listed in the Patient Instructions accessible in Encounters.   Signed, Alycia Rossetti  Shea Evans, PA-C 04/12/2023 4:17 PM     [facility] [place] [address] [place], [place] [address] [phone]

## 2023-04-12 ENCOUNTER — Encounter: Payer: Self-pay | Admitting: Physician Assistant

## 2023-04-12 ENCOUNTER — Ambulatory Visit: Payer: Medicare Other | Attending: Physician Assistant | Admitting: Physician Assistant

## 2023-04-12 VITALS — BP 110/70 | HR 81 | Ht 71.0 in | Wt 275.6 lb

## 2023-04-12 DIAGNOSIS — R918 Other nonspecific abnormal finding of lung field: Secondary | ICD-10-CM

## 2023-04-12 DIAGNOSIS — Z6838 Body mass index (BMI) 38.0-38.9, adult: Secondary | ICD-10-CM

## 2023-04-12 DIAGNOSIS — G4733 Obstructive sleep apnea (adult) (pediatric): Secondary | ICD-10-CM | POA: Diagnosis not present

## 2023-04-12 DIAGNOSIS — I5022 Chronic systolic (congestive) heart failure: Secondary | ICD-10-CM | POA: Diagnosis not present

## 2023-04-12 DIAGNOSIS — M79606 Pain in leg, unspecified: Secondary | ICD-10-CM | POA: Diagnosis not present

## 2023-04-12 DIAGNOSIS — I2510 Atherosclerotic heart disease of native coronary artery without angina pectoris: Secondary | ICD-10-CM

## 2023-04-12 DIAGNOSIS — R911 Solitary pulmonary nodule: Secondary | ICD-10-CM | POA: Diagnosis not present

## 2023-04-12 DIAGNOSIS — I10 Essential (primary) hypertension: Secondary | ICD-10-CM | POA: Diagnosis not present

## 2023-04-12 DIAGNOSIS — I428 Other cardiomyopathies: Secondary | ICD-10-CM | POA: Diagnosis not present

## 2023-04-12 DIAGNOSIS — E669 Obesity, unspecified: Secondary | ICD-10-CM

## 2023-04-12 NOTE — Addendum Note (Signed)
Addended by: Kendrick Fries on: 04/12/2023 04:48 PM   Modules accepted: Orders

## 2023-04-12 NOTE — Patient Instructions (Addendum)
Medication Instructions:   Your physician recommends that you continue on your current medications as directed. Please refer to the Current Medication list given to you today.  *If you need a refill on your cardiac medications before your next appointment, please call your pharmacy*   Lab Work:  If you have labs (blood work) drawn today and your tests are completely normal, you will receive your results only by: MyChart Message (if you have MyChart) OR A paper copy in the mail If you have any lab test that is abnormal or we need to change your treatment, we will call you to review the results.   Testing/Procedures:  CT Angiography (CTA) chest  is a special type of CT scan that uses a computer to produce multi-dimensional views of major blood vessels throughout the body. In CT angiography, a contrast material is injected through an IV to help visualize the blood vessels  Nothing to eat or drink 4 hours prior to test  [facility] [address]  [place], [place] [address]    Follow-Up: At [facility], you and your health needs are our priority.  As part of our continuing mission to provide you with exceptional heart care, we have created designated Provider Care Teams.  These Care Teams include your primary Cardiologist (physician) and Advanced Practice Providers (APPs -  Physician Assistants and Nurse Practitioners) who all work together to provide you with the care you need, when you need it.  We recommend signing up for the patient portal called "MyChart".  Sign up information is provided on this After Visit Summary.  MyChart is used to connect with patients for Virtual Visits (Telemedicine).  Patients are able to view lab/test results, encounter notes, upcoming appointments, etc.  Non-urgent messages can be sent to your provider as well.   To learn more about what you can do with MyChart, go to [email or web address].    Your next  appointment:   6 month(s)  Provider:   You may see Julien Nordmann, MD or one of the following Advanced Practice Providers on your designated Care Team:   Nicolasa Ducking, NP Eula Listen, PA-C Cadence Fransico Michael, PA-C Charlsie Quest, NP    *Please contact [phone] to have your CT of the chest scheduled.

## 2023-04-26 ENCOUNTER — Ambulatory Visit
Admission: RE | Admit: 2023-04-26 | Discharge: 2023-04-26 | Disposition: A | Discharge status: Home / Self Care | Payer: Medicare Other | Source: Ambulatory Visit | Attending: Physician Assistant | Admitting: Physician Assistant

## 2023-04-26 DIAGNOSIS — R918 Other nonspecific abnormal finding of lung field: Secondary | ICD-10-CM | POA: Diagnosis not present

## 2023-04-26 DIAGNOSIS — R911 Solitary pulmonary nodule: Secondary | ICD-10-CM | POA: Diagnosis not present

## 2023-05-03 NOTE — Progress Notes (Signed)
Called and scheduled patient for 05/07/2023 @ 2:20 pm.

## 2023-05-06 ENCOUNTER — Telehealth: Payer: Self-pay | Admitting: *Deleted

## 2023-05-06 NOTE — Telephone Encounter (Signed)
-----   Message from Eula Listen sent at 05/03/2023  7:51 AM EDT ----- No pulmonary nodules noted. However, there was a post on the liver than needs further imaging. I would like for her to follow up with her PCP to arrange this as from a cardiac perspective, things are stable. I will CC her PCP for this as well.   Clydie Braun, can you please follow up the liver lesion?

## 2023-05-06 NOTE — Telephone Encounter (Signed)
Left voicemail message to call back for review of results.  

## 2023-05-06 NOTE — Telephone Encounter (Signed)
Patient is returning call. Requesting call back 

## 2023-05-06 NOTE — Telephone Encounter (Signed)
Patient made aware of results and verbalized understanding.  Patient had already been notified by PCP. Repeat scan tomorrow.

## 2023-05-07 ENCOUNTER — Ambulatory Visit (INDEPENDENT_AMBULATORY_CARE_PROVIDER_SITE_OTHER): Payer: Medicare Other | Admitting: Physician Assistant

## 2023-05-07 DIAGNOSIS — Z91199 Patient's noncompliance with other medical treatment and regimen due to unspecified reason: Secondary | ICD-10-CM

## 2023-05-08 NOTE — Progress Notes (Signed)
Patient left the office without being roomed, triaged or seeing provider.

## 2023-05-10 ENCOUNTER — Telehealth: Payer: Self-pay

## 2023-05-10 NOTE — Telephone Encounter (Signed)
5 boxes of Ozempic received for the patient. Called and notified patient that it was ready to be picked up.

## 2023-05-11 IMAGING — CT CT HEART [facility] W/ CTA COR W/ SCORE W/ CA W/CM &/OR W/O CM
2 of 10 series · 8 of 20 positions shown, 10 images · non-contrast
Comparison: January 30, 2022.

Addendum:
CLINICAL DATA: Chest pain

EXAM:
Cardiac/Coronary  CTA
TECHNIQUE: The patient was scanned on a [employer] go.Top scanner.

[Series 26: multiphase % cta coronary 0.80 · axial · 0.35mm/px · z∈[-1101,-1012]mm · 5 of 2664 slices shown, 7 images]
[im 444/2664  vessel]
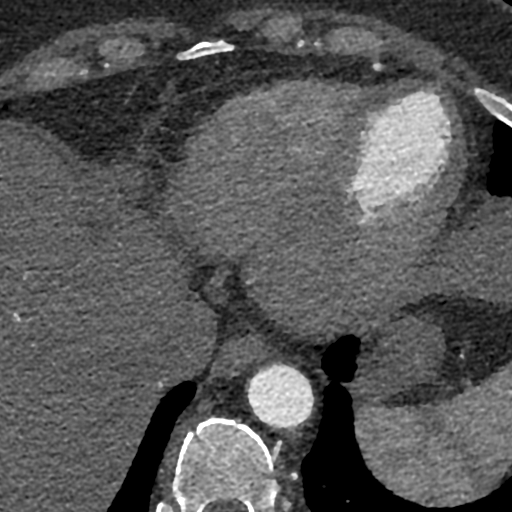
[im 444/2664  lung]
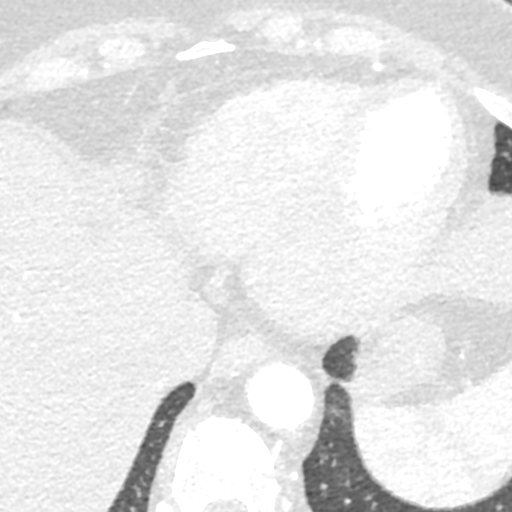
[im 888/2664  vessel]
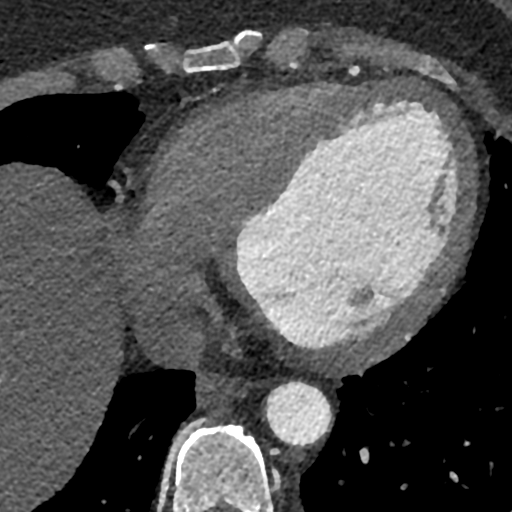
[im 1332/2664  vessel]
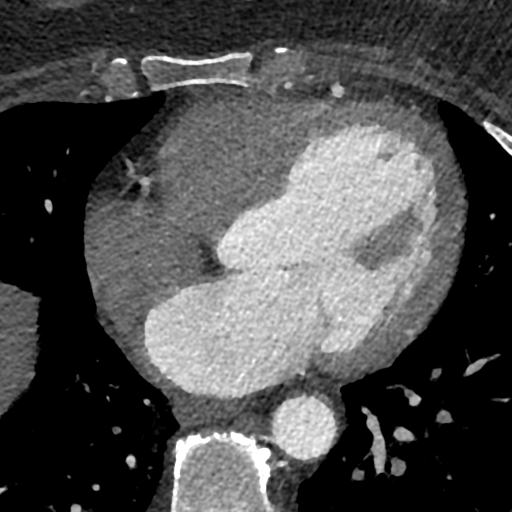
[im 1776/2664  vessel]
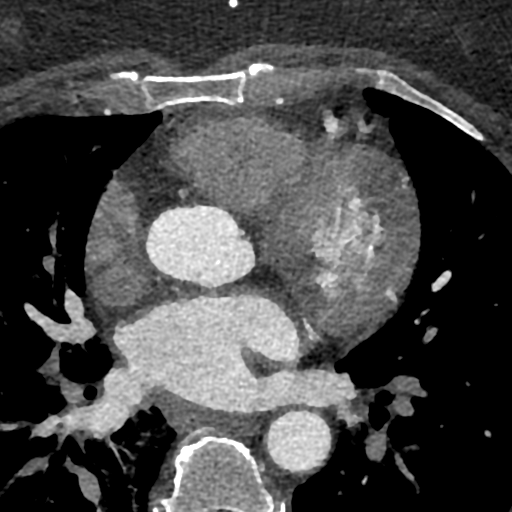
[im 2220/2664  vessel]
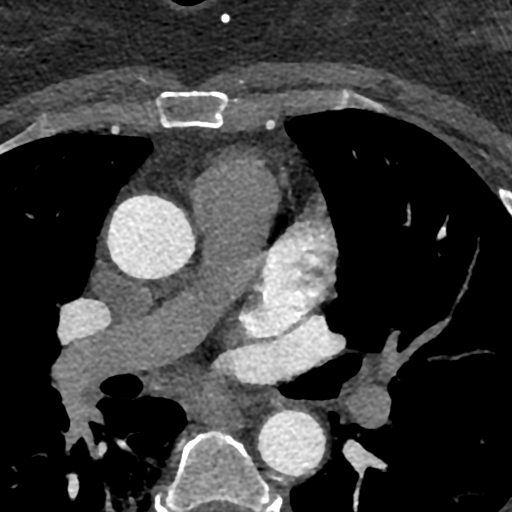
[im 2220/2664  lung]
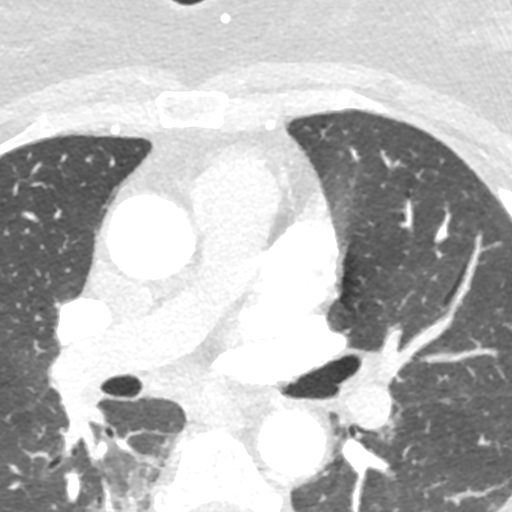

[Series 37: ms multiphase cta coronary 0.60 · axial · 0.35mm/px · z∈[-1090,-1023]mm · 3 of 2004 slices shown]
[im 501/2004  vessel]
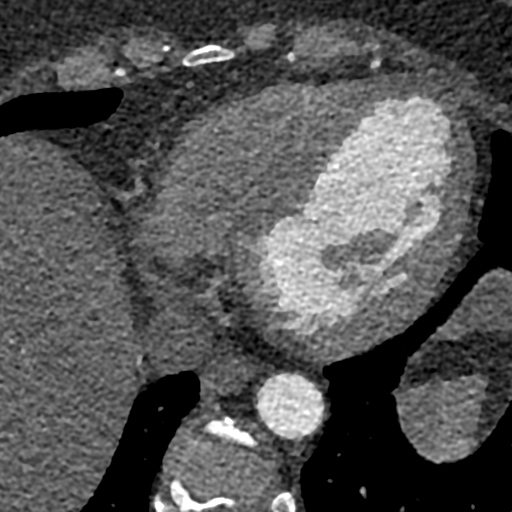
[im 1002/2004  vessel]
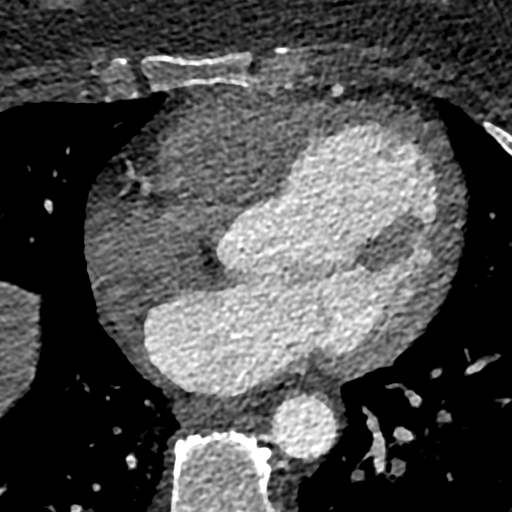
[im 1503/2004  vessel]
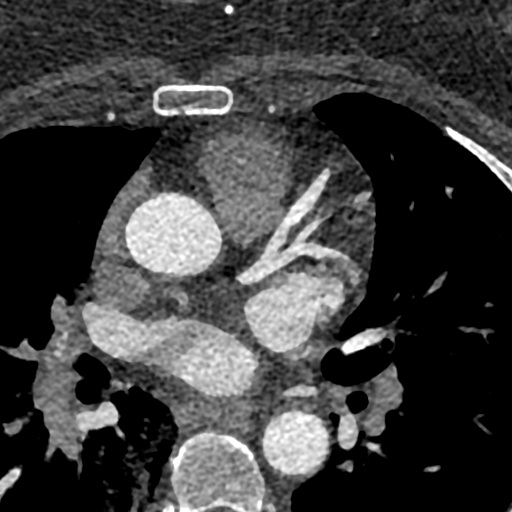

[8 of 20 positions shown; findings below may reference images not displayed]



Aortic Valve:  Trileaflet.  No calcifications.

Coronary Arteries:  Normal coronary origin.  Right dominance.

RCA is a dominant artery that gives rise to PDA and PLA. There is no
plaque.

Left main gives rise to LAD and LCX arteries. There is no LM
disease.

LAD has no plaque.

LCX is a non-dominant artery that gives rise to a large obtuse
marginal branch. There is no plaque.

Other findings:

Normal pulmonary vein drainage into the left atrium.

Normal left atrial appendage without a thrombus.

Normal size of the pulmonary artery.
IMPRESSION: 1. Normal coronary calcium score of 0. Patient is low risk for
coronary events.

2. Normal coronary origin with right dominance.

3. No evidence of CAD.

4. CAD-RADS 0. Consider non-atherosclerotic causes of chest pain.

ADDENDUM:
OVER-READ INTERPRETATION  CT CHEST

The following report is an over-read performed by radiologist Dr.
over-read does not include interpretation of cardiac or coronary
anatomy or pathology. The coronary calcium scoring and coronary
angiography interpretation by the cardiologist is attached. Imaging
of the chest is focused on cardiac structures and excludes much of
the chest on CT.
FINDINGS: Cardiovascular: Please see dedicated report for cardiovascular
details.

Mediastinum/Nodes: No acute findings or signs of adenopathy in the
visualized portions of the mediastinum.

Lungs/Pleura: Near complete resolution of ground-glass opacities in
the chest when compared to the recent study from Thursday January, 2022.
Bandlike changes along the posterior pleural surface likely a
mixture of atelectasis and post inflammatory changes also showing
improvement since previous imaging.

Upper Abdomen: Incidental imaging of upper abdominal contents
showing no acute process.

Musculoskeletal: No acute bone finding. No destructive bone process.
Spinal degenerative changes.
IMPRESSION: 1. Near complete resolution of ground-glass opacities in the chest
when compared to the recent study from Thursday January, 2022.
2. Bandlike changes along the posterior pleural surface likely a
mixture of atelectasis and post inflammatory changes also showing
improvement since previous imaging. This may reflect developing post
inflammatory fibrosis with and is mixed atelectatic changes at the
lung bases, suggest attention on follow-up.



Aortic Valve:  Trileaflet.  No calcifications.

Coronary Arteries:  Normal coronary origin.  Right dominance.

RCA is a dominant artery that gives rise to PDA and PLA. There is no
plaque.

Left main gives rise to LAD and LCX arteries. There is no LM
disease.

LAD has no plaque.

LCX is a non-dominant artery that gives rise to a large obtuse
marginal branch. There is no plaque.

Other findings:

Normal pulmonary vein drainage into the left atrium.

Normal left atrial appendage without a thrombus.

Normal size of the pulmonary artery.
IMPRESSION: 1. Normal coronary calcium score of 0. Patient is low risk for
coronary events.

2. Normal coronary origin with right dominance.

3. No evidence of CAD.

4. CAD-RADS 0. Consider non-atherosclerotic causes of chest pain.

## 2023-05-14 NOTE — Telephone Encounter (Signed)
Medication picked up by the patient.  

## 2023-05-16 NOTE — Progress Notes (Signed)
Cardiology Office Note    Date:  05/17/2023   ID:  Emily Steidle- Emily Hinton, DOB [birth year], MRN [id number]  PCP:  Larae Grooms, NP  Cardiologist:  Julien Nordmann, MD  Electrophysiologist:  None   Chief Complaint: Follow up  History of Present Illness:   Emily Hinton is a 62 y.o. 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   Emily Hinton was previously followed by cardiology in [place]. She was found to have a cardiomyopathy in 2018 with an EF of 45%. LHC at that time showed nonobstructive coronary arteries.  Echo in 05/2018 demonstrated a persistent cardiomyopathy with an EF of 45%, global hypokinesis, and diastolic dysfunction. This finding was unchanged when compared to echo in 2018.  Subsequent echo in 04/2021 showed an improved LV systolic function with an EF of 55%, probable normal LV wall motion, and diastolic dysfunction. She moved to [facility] from [place] several years ago.    She was admitted to the hospital in 01/2022 with worsening dyspnea and productive cough and found to have multilobar pneumonia with mild volume overload.  She was hypertensive and tachycardic upon presentation with an oxygen saturation of 72% on room air.  High-sensitivity troponin 35 with a delta troponin of 20.  BNP 189.  Chest x-ray showed volume overload versus pneumonia with small pleural effusions.  CT of the chest showed multilobar pneumonia with minimal pleural fluid on the left.  Echo during this admission showed an EF of 40-45%, mildly to moderately dilated LV internal cavity size, global hypokinesis, Gr2DD, normal RV systolic function  and ventricular cavity size, and mild mitral regurgitation.     Coronary CTA on 03/01/2022 showed a calcium score of 0, with no evidence of CAD.  Noncardiac over read was notable for near complete resolution of groundglass opacities when compared to imaging from 01/2022.  There were bandlike changes along the posterior pleural space felt to likely reflect a mixture of atelectasis and postinflammatory changes that were also improving.  It was recommended she follow-up with her PCP for these improving changes.   She was seen in the office on 04/12/2022 and was without symptoms of angina or decompensation.  She had stable 2-3 pillow orthopnea given prior CSF leak.  She remained on Entresto and had been started on lisinopril by outside office.  It was recommended that she discontinue lisinopril.  Amlodipine was also discontinued and Entresto was titrated to 49/51 mg twice daily.  She was otherwise continued on carvedilol, spironolactone, torsemide, and Jardiance.   She was seen in the office on 05/17/2022 and continued to do well from a cardiac perspective, without symptoms of angina or decompensation.  Her weight was stable by our scale.  Entresto was titrated to 97/103 mg twice daily and carvedilol was titrated to 12.5 mg twice daily.  She was otherwise continued on Jardiance, spironolactone, and torsemide.  Labs showed mild AKI at that time leading torsemide to be transitioned to as needed.  Follow-up limited echo 06/21/2022 showed a persistent cardiomyopathy with an EF of 35 to 40%, global hypokinesis, mildly dilated internal LV cavity size, grade 1 diastolic dysfunction, normal RV systolic function and ventricular cavity  size, mild mitral regurgitation, and an estimated right atrial pressure of 3 mmHg.     She was seen in the office on 07/05/2022 and remained without symptoms of angina or decompensation with stable chronic dyspnea.  Her weight was stable.  Carvedilol was titrated to 25 mg daily with continuation of  Entresto, Jardiance, spironolactone, and torsemide.  Cardiac MRI on 07/18/2022 demonstrated an LVEF of 32% with no evidence of MI, scar, or LGE with normal RV systolic function and ventricular cavity size and no significant valvular abnormalities.  Findings were suggestive of nonischemic cardiomyopathy.   I last saw her in 10/2022, at which time she was without symptoms of angina or cardiac decompensation.  Given persistent cardiomyopathy despite optimization of GDMT, she was referred to the advanced heart failure service and evaluated by them on 11/30/2022, at which time she was noted to have a mildly elevated ReDs vest of 36%.  Her dyspnea was felt to be somewhat out of proportion to her exam.  Torsemide was briefly titrated.  CPX was a submaximal test with the resting spirometry suggestive of moderate restrictive lung function in the setting of severe obesity.  Exercise testing revealed a moderate functional limitation due to primarily obesity and deconditioning.  There was mild chronotropic incompetence in the setting of submaximal aerobic effort with no other evidence of significant heart failure limitation.  She followed up with advanced heart failure on 02/22/2023 with continued easy fatigability and shortness of breath when walking upstairs.  She was without frank angina.  She had been on semaglutide, though had not increased the dose and has not lost weight.  Her weight was up 3 pounds by their scale, though was not volume up.  Her dyspnea was out of proportion to her exam with findings suggestive of main limitation being obesity and deconditioning.  She was referred to our pharmacy team for bariatric management.  She was last seen in the office in 03/2023 and without symptoms of angina or cardiac decompensation.  Chronic fatigue was stable.  No changes were indicated.  Follow-up chest CT for pulmonary nodule 04/26/2023 showed interval resolution of airspace opacities with no suspicious pulmonary nodule.   Partially visualized indeterminate lesion of the right hepatic lobe measuring 1.5 cm.  She was advised to follow-up with PCP for liver lesion.   She comes in doing well from a cardiac perspective and is without symptoms of angina or cardiac decompensation.  She does feel like there has been some improvement in her chronic fatigue and dyspnea.  No lower extremity swelling or progressive orthopnea (stable 3 pillow orthopnea in the setting of VP shunt).  Weight stable.  She has been out of Jardiance secondary to financial constraints and did not qualify for patient assistance.  She continues to note sharp pain along the left lower extremity that is worse at night and improves with pressure.  She is scheduled for ABIs later this month.  No symptoms of claudication or nonhealing wounds.   Labs independently reviewed: 02/2023 - A1c 6.4, TC 100, TG 95, HDL 47, LDL 35, BUN 14, serum renin 1.12, potassium 3.8, albumin 4.4, AST/ALT normal, BNP 165 11/2022 - A1c 10.1 09/2021 - TSH normal, magnesium 2.0   Past Medical History:  Diagnosis Date   CHF (congestive heart failure) (HCC)    CSF leak    Diabetes mellitus without complication (HCC)    DM2 (diabetes mellitus, type 2) (HCC)    Essential hypertension    Heart attack (HCC)    Hyperlipidemia  Morbid obesity (HCC)    NICM (nonischemic cardiomyopathy) (HCC)    Nonobstructive atherosclerosis of coronary artery    OSA (obstructive sleep apnea)     Past Surgical History:  Procedure Laterality Date   CERVICAL FUSION  2017   CSF leak repair x 2     2014 and 2017   CSF SHUNT  2017    Current Medications: Current Meds  Medication Sig   acetaminophen (TYLENOL) 500 MG tablet Take 500 mg by mouth every 6 (six) hours as needed.   aspirin 81 MG chewable tablet Chew 81 mg by mouth daily.   carvedilol (COREG) 25 MG tablet Take 1 tablet (25 mg total) by mouth 2 (two) times daily.   gabapentin (NEURONTIN) 300 MG capsule Take 1 capsule (300 mg total) by  mouth 3 (three) times daily.   insulin glargine (LANTUS SOLOSTAR) 100 UNIT/ML Solostar Pen Inject 30 Units into the skin daily.   insulin lispro (HUMALOG KWIKPEN) 100 UNIT/ML KwikPen Inject 6 Units into the skin 3 (three) times daily. With meals.  This replaces Novolin R.   metFORMIN (GLUCOPHAGE) 1000 MG tablet Take 1 tablet (1,000 mg total) by mouth 2 (two) times daily with a meal.   omeprazole (PRILOSEC) 20 MG capsule Take 20 mg by mouth daily.   rosuvastatin (CRESTOR) 10 MG tablet Take 1 tablet (10 mg total) by mouth daily.   sacubitril-valsartan (ENTRESTO) 97-103 MG Take 1 tablet by mouth 2 (two) times daily.   Semaglutide,0.25 or 0.5MG /DOS, (OZEMPIC, 0.25 OR 0.5 MG/DOSE,) 2 MG/1.5ML SOPN Inject 0.5 mg into the skin once a week.   spironolactone (ALDACTONE) 25 MG tablet Take 1 tablet (25 mg total) by mouth daily.   torsemide (DEMADEX) 20 MG tablet Take 2 tablets (40 mg total) by mouth daily for 2 days, THEN 1 tablet (20 mg total) daily.    Allergies:   Cashew nut (anacardium occidentale) skin test and Cashew nut oil   Social History   Socioeconomic History   Marital status: Single    Spouse name: Not on file   Number of children: Not on file   Years of education: Not on file   Highest education level: Not on file  Occupational History   Not on file  Tobacco Use   Smoking status: Never   Smokeless tobacco: Never  Vaping Use   Vaping status: Never Used  Substance and Sexual Activity   Alcohol use: Never   Drug use: Never   Sexual activity: Not on file  Other Topics Concern   Not on file  Social History Narrative   Not on file   Social Determinants of Health   Financial Resource Strain: Medium Risk (01/28/2023)   Overall Financial Resource Strain (CARDIA)    Difficulty of Paying Living Expenses: Somewhat hard  Food Insecurity: No Food Insecurity (01/28/2023)   Hunger Vital Sign    Worried About Running Out of Food in the Last Year: Never true    Ran Out of Food in the  Last Year: Never true  Transportation Needs: No Transportation Needs (01/28/2023)   PRAPARE - Administrator, Civil Service (Medical): No    Lack of Transportation (Non-Medical): No  Physical Activity: Insufficiently Active (01/28/2023)   Exercise Vital Sign    Days of Exercise per Week: 2 days    Minutes of Exercise per Session: 20 min  Stress: No Stress Concern Present (01/28/2023)   [employer] of Occupational Health - Occupational Stress Questionnaire    Feeling of  Stress : Not at all  Social Connections: Moderately Isolated (01/28/2023)   Social Connection and Isolation Panel [NHANES]    Frequency of Communication with Friends and Family: More than three times a week    Frequency of Social Gatherings with Friends and Family: Three times a week    Attends Religious Services: 1 to 4 times per year    Active Member of Clubs or Organizations: No    Attends Banker Meetings: Never    Marital Status: Divorced     Family History:  The patient's family history includes Heart failure in her father; Hypertension in her father; Kidney disease in her father. There is no history of Breast cancer.  ROS:   12-point review of systems is negative unless otherwise noted in the HPI.   EKGs/Labs/Other Studies Reviewed:    Studies reviewed were summarized above. The additional studies were reviewed today:  2D echo 01/30/2022: 1. Left ventricular ejection fraction, by estimation, is 40 to 45%. The  left ventricle has mildly decreased function. The left ventricle  demonstrates global hypokinesis. The left ventricular internal cavity size  was mildly to moderately dilated. Left  ventricular diastolic parameters are consistent with Grade II diastolic  dysfunction (pseudonormalization). Elevated left atrial pressure.   2. Right ventricular systolic function is normal. The right ventricular  size is normal. Tricuspid regurgitation signal is inadequate for assessing  PA  pressure.   3. The mitral valve is normal in structure. Mild mitral valve  regurgitation. No evidence of mitral stenosis.   4. The aortic valve was not well visualized. Aortic valve regurgitation  is not visualized. No aortic stenosis is present. __________   Coronary CTA 03/01/2022: Noncardiac over read -  1. Near complete resolution of ground-glass opacities in the chest when compared to the recent study from April of 2023. 2. Bandlike changes along the posterior pleural surface likely a mixture of atelectasis and post inflammatory changes also showing improvement since previous imaging. This may reflect developing post inflammatory fibrosis with and is mixed atelectatic changes at the lung bases, suggest attention on follow-up.   Cardiac overread -  Aorta:  Normal size.  No calcifications.  No dissection.   Aortic Valve:  Trileaflet.  No calcifications.   Coronary Arteries:  Normal coronary origin.  Right dominance.   RCA is a dominant artery that gives rise to PDA and PLA. There is no plaque.   Left main gives rise to LAD and LCX arteries. There is no LM disease.   LAD has no plaque.   LCX is a non-dominant artery that gives rise to a large obtuse marginal branch. There is no plaque.   Other findings:   Normal pulmonary vein drainage into the left atrium.   Normal left atrial appendage without a thrombus.   Normal size of the pulmonary artery.   IMPRESSION: 1. Normal coronary calcium score of 0. Patient is low risk for coronary events. 2. Normal coronary origin with right dominance. 3. No evidence of CAD. 4. CAD-RADS 0. Consider non-atherosclerotic causes of chest pain. __________   Limited echo 06/21/2022: 1. Left ventricular ejection fraction, by estimation, is 35 to 40%. The  left ventricle has moderately decreased function. The left ventricle  demonstrates global hypokinesis. The left ventricular internal cavity size  was mildly dilated. Left ventricular   diastolic parameters are consistent with Grade I diastolic dysfunction  (impaired relaxation).   2. Right ventricular systolic function is normal. The right ventricular  size is normal. Tricuspid regurgitation  signal is inadequate for assessing  PA pressure.   3. The mitral valve is normal in structure. Mild mitral valve  regurgitation. No evidence of mitral stenosis.   4. The aortic valve is normal in structure. Aortic valve regurgitation is  not visualized. No aortic stenosis is present.   5. The inferior vena cava is normal in size with greater than 50%  respiratory variability, suggesting right atrial pressure of 3 mmHg. __________   Cardiac MRI 07/18/2022: FINDINGS: 1. Mildly dilated left ventricular size. Moderately reduced LV systolic function (LVEF = 32%). There is global hypokinesis with no regional wall motion abnormalities.   There is no late gadolinium enhancement in the left ventricular myocardium.   LVEDV: 276 ml   LVESV: 187 ml   SV: 89 ml   CO: 4.9 L/min   Myocardial mass: 152   2. Normal right ventricular size, thickness and low normal systolic function (RVEF = 47%). There are no regional wall motion abnormalities.   3.  Normal left and right atrial size.   4. Normal size of the aortic root, ascending aorta and pulmonary artery. Normal pulmonic and aortic flow. Qp:Qs = 1.05   5.  Trace MR, Trace TR. no other significant valvular abnormalities.   6.  Normal pericardium.  No pericardial effusion.   IMPRESSION: 1.  Mildly dilated LV, moderately reduced LV function.  LVEF = 32% 2. No evidence for myocardial infiltration, late gadolinium enhancement or scar. 3.  No significant valvular abnormalities. 4.  Normal RV size and function 5.  Findings suggest non-ischemic cardiomyopathy. __________   Limited echo 11/02/2022: 1. Left ventricular ejection fraction, by estimation, is 40 to 45%. Left  ventricular ejection fraction by 3D volume is 43 %. The  left ventricle has  mildly decreased function. The left ventricle demonstrates global  hypokinesis. The left ventricular  internal cavity size was mildly dilated. Left ventricular diastolic  parameters are indeterminate. The average left ventricular global  longitudinal strain is -8.5 %. The global longitudinal strain is abnormal.   2. Right ventricular systolic function is normal. The right ventricular  size is normal.   3. Left atrial size was mildly dilated.   4. The mitral valve is normal in structure. Moderate mitral valve  regurgitation. No evidence of mitral stenosis.   5. The aortic valve is normal in structure. Aortic valve regurgitation is  not visualized. No aortic stenosis is present.   6. The inferior vena cava is normal in size with greater than 50%  respiratory variability, suggesting right atrial pressure of 3 mmHg.   Comparison(s): 06/21/2022-EF 35-40%.  __________  CPX 01/17/2023: Conclusion: The interpretation of this test is limited due to submaximal effort during the exercise. Based on available data, exercise testing with gas exchange demonstrates moderate functional impairment when compared to matched sedentary norms. Pre-exercise spirometry demonstrates moderate restrictive lung physiology, most likely related to body habitus. Patient appears limited due to deconditioning (due to previous medical conditions inhibiting physical activity) and body habitus with 13% improvement of PVO2 with corrections for ideal body weight.    EKG:  EKG is not ordered today.    Recent Labs: 02/22/2023: B Natriuretic Peptide 165.6 03/08/2023: ALT 17; BUN 14; Creatinine, Ser 1.12; Potassium 3.8; Sodium 143  Recent Lipid Panel    Component Value Date/Time   CHOL 100 03/08/2023 1015   TRIG 95 03/08/2023 1015   HDL 47 03/08/2023 1015   CHOLHDL 2.1 03/08/2023 1015   LDLCALC 35 03/08/2023 1015    PHYSICAL EXAM:  VS:  BP 120/86 (BP Location: Left Arm, Patient Position: Sitting, Cuff  Size: Large)   Pulse 86   Ht 5\' 11"  (1.803 m)   Wt 277 lb (125.6 kg)   SpO2 93%   BMI 38.63 kg/m   BMI: Body mass index is 38.63 kg/m.  Physical Exam Vitals reviewed.  Constitutional:      Appearance: She is well-developed.  HENT:     Head: Normocephalic and atraumatic.  Eyes:     General:        Right eye: No discharge.        Left eye: No discharge.  Neck:     Vascular: No JVD.  Cardiovascular:     Rate and Rhythm: Normal rate and regular rhythm.     Heart sounds: Normal heart sounds, S1 normal and S2 normal. Heart sounds not distant. No midsystolic click and no opening snap. No murmur heard.    No friction rub.  Pulmonary:     Effort: Pulmonary effort is normal. No respiratory distress.     Breath sounds: Normal breath sounds. No decreased breath sounds, wheezing or rales.  Chest:     Chest wall: No tenderness.  Abdominal:     General: There is no distension.  Musculoskeletal:     Cervical back: Normal range of motion.     Right lower leg: No edema.     Left lower leg: No edema.  Skin:    General: Skin is warm and dry.     Nails: There is no clubbing.  Neurological:     Mental Status: She is alert and oriented to person, place, and time.  Psychiatric:        Speech: Speech normal.        Behavior: Behavior normal.        Thought Content: Thought content normal.        Judgment: Judgment normal.     Wt Readings from Last 3 Encounters:  05/17/23 277 lb (125.6 kg)  04/12/23 275 lb 9.6 oz (125 kg)  03/08/23 267 lb (121.1 kg)     ASSESSMENT & PLAN:   HFrEF secondary to NICM: Euvolemic and well compensated with NYHA class II symptoms.  She remains on carvedilol 25 mg twice daily, Entresto 97/23 mg twice daily, and spironolactone 25 mg daily.  No longer on Jardiance secondary to financial constraints and she did not qualify for patient assistance.  She declines addition of BiDil at this time.  Persistent dyspnea is out of proportion to exam with CPX earlier this  year demonstrating predominant limitation of obesity and deconditioning.  Continue to follow-up with advanced heart failure service.  Nonobstructive CAD: No symptoms suggestive of angina.  Recent coronary CTA showed a calcium score of 0 with no evidence of CAD.  Continue aggressive risk factor modification and primary prevention including aspirin, carvedilol, and rosuvastatin.  No indication for further ischemic testing at this time.  HTN: Blood pressure is well-controlled in the office today.  Continue medical therapy as above.  Class II obesity: Weight loss is encouraged through heart healthy diet and regular exercise.  Remains on semaglutide.  Has previously been referred to our pharmacy team for weight management.  OSA: CPAP.   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  Less consistent with claudication.  ABIs pending.   Liver lesion: NSAID only noted to have a partially visualized indeterminate lesion of the right hepatic lobe measuring 1.5  cm on chest CT 04/26/2023.  Patient has been advised to follow-up with PCP for further evaluation.  Follow up is scheduled for next week.   Disposition: F/u with Dr. Mariah Milling or an APP as scheduled in 09/2023.   Medication Adjustments/Labs and Tests Ordered: Current medicines are reviewed at length with the patient today.  Concerns regarding medicines are outlined above. Medication changes, Labs and Tests ordered today are summarized above and listed in the Patient Instructions accessible in Encounters.   SignedEula Listen, PA-C 05/17/2023 1:30 PM     [facility] [address] [place], [place] [address] [phone]

## 2023-05-17 ENCOUNTER — Encounter: Payer: Self-pay | Admitting: Physician Assistant

## 2023-05-17 ENCOUNTER — Ambulatory Visit: Payer: Medicare Other | Attending: Physician Assistant | Admitting: Physician Assistant

## 2023-05-17 VITALS — BP 120/86 | HR 86 | Ht 71.0 in | Wt 277.0 lb

## 2023-05-17 DIAGNOSIS — M79606 Pain in leg, unspecified: Secondary | ICD-10-CM | POA: Diagnosis not present

## 2023-05-17 DIAGNOSIS — I2510 Atherosclerotic heart disease of native coronary artery without angina pectoris: Secondary | ICD-10-CM

## 2023-05-17 DIAGNOSIS — I428 Other cardiomyopathies: Secondary | ICD-10-CM

## 2023-05-17 DIAGNOSIS — E669 Obesity, unspecified: Secondary | ICD-10-CM

## 2023-05-17 DIAGNOSIS — G4733 Obstructive sleep apnea (adult) (pediatric): Secondary | ICD-10-CM

## 2023-05-17 DIAGNOSIS — I10 Essential (primary) hypertension: Secondary | ICD-10-CM | POA: Diagnosis not present

## 2023-05-17 DIAGNOSIS — I5022 Chronic systolic (congestive) heart failure: Secondary | ICD-10-CM

## 2023-05-17 DIAGNOSIS — K769 Liver disease, unspecified: Secondary | ICD-10-CM | POA: Diagnosis not present

## 2023-05-17 DIAGNOSIS — Z6838 Body mass index (BMI) 38.0-38.9, adult: Secondary | ICD-10-CM

## 2023-05-17 NOTE — Patient Instructions (Signed)
Medication Instructions:  No changes at this time.   *If you need a refill on your cardiac medications before your next appointment, please call your pharmacy*   Lab Work: None  If you have labs (blood work) drawn today and your tests are completely normal, you will receive your results only by: MyChart Message (if you have MyChart) OR A paper copy in the mail If you have any lab test that is abnormal or we need to change your treatment, we will call you to review the results.   Testing/Procedures: None    Follow-Up: At [facility], you and your health needs are our priority.  As part of our continuing mission to provide you with exceptional heart care, we have created designated Provider Care Teams.  These Care Teams include your primary Cardiologist (physician) and Advanced Practice Providers (APPs -  Physician Assistants and Nurse Practitioners) who all work together to provide you with the care you need, when you need it.   Your next appointment:   Keep scheduled appointment.

## 2023-05-24 ENCOUNTER — Ambulatory Visit: Payer: Medicare Other | Admitting: Physician Assistant

## 2023-05-24 ENCOUNTER — Telehealth: Payer: Self-pay | Admitting: Nurse Practitioner

## 2023-05-24 NOTE — Telephone Encounter (Signed)
Copied from CRM [id number]. Topic: General - Other >> May 24, 2023  2:31 PM Epimenio Foot F wrote: Reason for CRM: Pt is calling in returning a call from the office.

## 2023-05-25 ENCOUNTER — Other Ambulatory Visit: Payer: Self-pay | Admitting: Nurse Practitioner

## 2023-05-27 ENCOUNTER — Other Ambulatory Visit: Payer: Self-pay

## 2023-05-27 MED ORDER — ENTRESTO 97-103 MG PO TABS: 1.0000 | ORAL_TABLET | Freq: Two times a day (BID) | ORAL | 0 refills | Status: DC

## 2023-05-28 MED ORDER — OMEPRAZOLE 20 MG PO CPDR: 20.0000 mg | DELAYED_RELEASE_CAPSULE | Freq: Every day | ORAL | 2 refills | Status: DC

## 2023-05-28 MED ORDER — ASPIRIN 81 MG PO CHEW: 81.0000 mg | CHEWABLE_TABLET | Freq: Every day | ORAL | 3 refills | Status: DC

## 2023-05-31 ENCOUNTER — Ambulatory Visit (INDEPENDENT_AMBULATORY_CARE_PROVIDER_SITE_OTHER): Payer: Medicare Other | Admitting: Physician Assistant

## 2023-05-31 ENCOUNTER — Ambulatory Visit: Payer: Medicare Other | Attending: Cardiology

## 2023-05-31 ENCOUNTER — Encounter: Payer: Self-pay | Admitting: Physician Assistant

## 2023-05-31 VITALS — BP 120/86 | HR 86 | Ht 71.0 in | Wt 275.2 lb

## 2023-05-31 DIAGNOSIS — E1169 Type 2 diabetes mellitus with other specified complication: Secondary | ICD-10-CM | POA: Diagnosis not present

## 2023-05-31 DIAGNOSIS — K769 Liver disease, unspecified: Secondary | ICD-10-CM | POA: Diagnosis not present

## 2023-05-31 DIAGNOSIS — E781 Pure hyperglyceridemia: Secondary | ICD-10-CM

## 2023-05-31 DIAGNOSIS — M79605 Pain in left leg: Secondary | ICD-10-CM

## 2023-05-31 DIAGNOSIS — Z794 Long term (current) use of insulin: Secondary | ICD-10-CM | POA: Diagnosis not present

## 2023-05-31 DIAGNOSIS — M79606 Pain in leg, unspecified: Secondary | ICD-10-CM

## 2023-05-31 MED ORDER — ROSUVASTATIN CALCIUM 10 MG PO TABS
10.0000 mg | ORAL_TABLET | Freq: Every day | ORAL | 1 refills | Status: DC
Start: 2023-05-31 — End: 2023-12-03

## 2023-05-31 MED ORDER — EMPAGLIFLOZIN 25 MG PO TABS
25.0000 mg | ORAL_TABLET | Freq: Every day | ORAL | 0 refills | Status: DC
Start: 2023-05-31 — End: 2024-07-08

## 2023-05-31 NOTE — Assessment & Plan Note (Signed)
Patient requesting refill of Jardiance - provided per request today  Recommend follow up in 1-2 months for further DM discussion and chronic disease management

## 2023-05-31 NOTE — Progress Notes (Signed)
Acute Office Visit   Patient: Emily Hinton   DOB: 01-19-1961   62 y.o. Female  MRN: [id number] Visit Date: 05/31/2023  Today's healthcare provider: Oswaldo Conroy Dinah Lupa, PA-C  Introduced myself to the patient as a Secondary school teacher and provided education on APPs in clinical practice.    Chief Complaint  Patient presents with   Discuss Result   Medication Refill   Anxiety    Patient is having worsening anxiety, when she is driving and says the traffic tends to trigger her and she feels as if she is having panic attacks.    Subjective    HPI HPI     Anxiety    Additional comments: Patient is having worsening anxiety, when she is driving and says the traffic tends to trigger her and she feels as if she is having panic attacks.       Last edited by Malen Gauze, CMA on 05/31/2023 11:05 AM.        Patient had recent CT chest for nodule follow up ordered by Cardiology with incidental liver lesion noted  Radiology recommended follow up imaging with MRI abdomen with contrast  Most recent CMP was overall normal in regards to liver testing    Medications: Outpatient Medications Prior to Visit  Medication Sig   acetaminophen (TYLENOL) 500 MG tablet Take 500 mg by mouth every 6 (six) hours as needed.   aspirin 81 MG chewable tablet Chew 1 tablet (81 mg total) by mouth daily.   carvedilol (COREG) 25 MG tablet Take 1 tablet (25 mg total) by mouth 2 (two) times daily.   gabapentin (NEURONTIN) 300 MG capsule Take 1 capsule (300 mg total) by mouth 3 (three) times daily.   insulin glargine (LANTUS SOLOSTAR) 100 UNIT/ML Solostar Pen Inject 30 Units into the skin daily.   insulin lispro (HUMALOG KWIKPEN) 100 UNIT/ML KwikPen Inject 6 Units into the skin 3 (three) times daily. With meals.  This replaces Novolin R.   metFORMIN (GLUCOPHAGE) 1000 MG tablet Take 1 tablet (1,000 mg total) by mouth 2 (two) times daily with a meal.   omeprazole (PRILOSEC) 20 MG capsule Take 1 capsule (20  mg total) by mouth daily.   sacubitril-valsartan (ENTRESTO) 97-103 MG Take 1 tablet by mouth 2 (two) times daily.   Semaglutide,0.25 or 0.5MG /DOS, (OZEMPIC, 0.25 OR 0.5 MG/DOSE,) 2 MG/1.5ML SOPN Inject 0.5 mg into the skin once a week.   spironolactone (ALDACTONE) 25 MG tablet Take 1 tablet (25 mg total) by mouth daily.   torsemide (DEMADEX) 20 MG tablet Take 2 tablets (40 mg total) by mouth daily for 2 days, THEN 1 tablet (20 mg total) daily.   [DISCONTINUED] rosuvastatin (CRESTOR) 10 MG tablet Take 1 tablet (10 mg total) by mouth daily.   [DISCONTINUED] empagliflozin (JARDIANCE) 25 MG TABS tablet Take 1 tablet (25 mg total) by mouth daily before breakfast. To replace 10mg - place on hold until patient requests please (Patient not taking: Reported on 05/17/2023)   No facility-administered medications prior to visit.    Review of Systems      Objective    BP 120/86   Pulse 86   Ht 5\' 11"  (1.803 m)   Wt 275 lb 3.2 oz (124.8 kg)   SpO2 96%   BMI 38.38 kg/m     Physical Exam Vitals reviewed.  Constitutional:      General: She is awake.     Appearance: Normal appearance. She is well-developed and well-groomed.  HENT:     Head: Normocephalic and atraumatic.  Pulmonary:     Effort: Pulmonary effort is normal.  Neurological:     Mental Status: She is alert.  Psychiatric:        Attention and Perception: Attention normal.        Mood and Affect: Mood normal.        Speech: Speech normal.        Behavior: Behavior normal. Behavior is cooperative.       No results found for any visits on 05/31/23.  Assessment & Plan      No follow-ups on file.       Problem List Items Addressed This Visit       Digestive   Liver lesion, right lobe - Primary    Unsure of chronicity- noted as incidental finding on CT chest for nodule follow up ordered by Cardiology on 04/26/23 Radiology recommended contrast MRI abdomen for further evaluation Reviewed this with patient and placed orders  per radiology recommendations She does have VP shunt but denies metal components- this was noted in imaging order and should be reviewed further with imaging office.  Results of imaging to dictate further management       Relevant Orders   MR ABDOMEN W CONTRAST     Endocrine   Diabetes mellitus (HCC)    Patient requesting refill of Jardiance - provided per request today  Recommend follow up in 1-2 months for further DM discussion and chronic disease management       Relevant Medications   rosuvastatin (CRESTOR) 10 MG tablet   empagliflozin (JARDIANCE) 25 MG TABS tablet     Other   Hypertriglyceridemia    Patient is due for refill of crestor- refill provided today Recommend follow up in 1-2 months for chronic illness management and monitoring       Relevant Medications   rosuvastatin (CRESTOR) 10 MG tablet     No follow-ups on file.   I, Shaynna Husby E Omarri Eich, PA-C, have reviewed all documentation for this visit. The documentation on 05/31/23 for the exam, diagnosis, procedures, and orders are all accurate and complete.   Jacquelin Hawking, MHS, PA-C [facility]

## 2023-05-31 NOTE — Assessment & Plan Note (Signed)
Patient is due for refill of crestor- refill provided today Recommend follow up in 1-2 months for chronic illness management and monitoring

## 2023-05-31 NOTE — Assessment & Plan Note (Signed)
Unsure of chronicity- noted as incidental finding on CT chest for nodule follow up ordered by Cardiology on 04/26/23 Radiology recommended contrast MRI abdomen for further evaluation Reviewed this with patient and placed orders per radiology recommendations She does have VP shunt but denies metal components- this was noted in imaging order and should be reviewed further with imaging office.  Results of imaging to dictate further management

## 2023-06-02 LAB — VAS US ABI WITH/WO TBI
Left ABI: 1.15
Right ABI: 1.15

## 2023-06-03 NOTE — Addendum Note (Signed)
Addended by: Jacquelin Hawking on: 06/03/2023 01:21 PM   Modules accepted: Orders

## 2023-06-05 ENCOUNTER — Other Ambulatory Visit: Payer: Self-pay | Admitting: Physician Assistant

## 2023-06-05 DIAGNOSIS — E1169 Type 2 diabetes mellitus with other specified complication: Secondary | ICD-10-CM

## 2023-06-06 ENCOUNTER — Other Ambulatory Visit: Payer: Self-pay | Admitting: Physician Assistant

## 2023-06-06 DIAGNOSIS — K769 Liver disease, unspecified: Secondary | ICD-10-CM

## 2023-06-06 NOTE — Telephone Encounter (Signed)
Unable to refill per protocol, Rx request was last refilled 05/31/23 for 90 days.E-Prescribing Status: Receipt confirmed by pharmacy (05/31/2023 11:21 AM EDT).  Requested Prescriptions  Pending Prescriptions Disp Refills   JARDIANCE 25 MG TABS tablet [Pharmacy Med Name: JARDIANCE 25MG  TABLETS] 90 tablet 0    Sig: TAKE 1 TABLET(25 MG) BY MOUTH DAILY BEFORE BREAKFAST     Endocrinology:  Diabetes - SGLT2 Inhibitors Failed - 06/05/2023  8:04 AM      Failed - Cr in normal range and within 360 days    Creatinine, Ser  Date Value Ref Range Status  03/08/2023 1.12 (H) 0.57 - 1.00 mg/dL Final         Failed - eGFR in normal range and within 360 days    GFR, Estimated  Date Value Ref Range Status  02/22/2023 >60 >60 mL/min Final    Comment:    (NOTE) Calculated using the CKD-EPI Creatinine Equation (2021)    eGFR  Date Value Ref Range Status  03/08/2023 56 (L) >59 mL/min/1.73 Final         Passed - HBA1C is between 0 and 7.9 and within 180 days    Hgb A1c MFr Bld  Date Value Ref Range Status  03/08/2023 6.4 (H) 4.8 - 5.6 % Final    Comment:             Prediabetes: 5.7 - 6.4          Diabetes: >6.4          Glycemic control for adults with diabetes: <7.0          Passed - Valid encounter within last 6 months    Recent Outpatient Visits           6 days ago Liver lesion, right lobe   [place] [facility] Mecum, Oswaldo Conroy, PA-C   1 month ago Failure to attend appointment   [place] [facility] Mecum, Oswaldo Conroy, PA-C   3 months ago HFrEF (heart failure with reduced ejection fraction) (HCC)   [place] [facility] Larae Grooms, NP   5 months ago Type 2 diabetes mellitus with other specified complication, with long-term current use of insulin [facility])   [place] [facility] Larae Grooms, NP   6 months ago Essential hypertension   [place] [facility] Larae Grooms, NP       Future Appointments              In 2 months Larae Grooms, NP [facility]   In 3 months Dunn, Raymon Mutton, PA-C [facility]

## 2023-06-06 NOTE — Addendum Note (Signed)
Addended by: Jacquelin Hawking on: 06/06/2023 09:54 AM   Modules accepted: Orders

## 2023-06-07 ENCOUNTER — Ambulatory Visit
Admission: RE | Admit: 2023-06-07 | Discharge: 2023-06-07 | Disposition: A | Discharge status: Home / Self Care | Payer: Medicare Other | Source: Ambulatory Visit | Attending: Physician Assistant | Admitting: Physician Assistant

## 2023-06-07 DIAGNOSIS — K769 Liver disease, unspecified: Secondary | ICD-10-CM | POA: Insufficient documentation

## 2023-06-07 DIAGNOSIS — R188 Other ascites: Secondary | ICD-10-CM | POA: Diagnosis not present

## 2023-06-07 DIAGNOSIS — D1803 Hemangioma of intra-abdominal structures: Secondary | ICD-10-CM | POA: Diagnosis not present

## 2023-06-07 MED ORDER — GADOBUTROL 1 MMOL/ML IV SOLN
10.0000 mL | Freq: Once | INTRAVENOUS | Status: AC | PRN
  Administered 2023-06-07: 10 mL via INTRAVENOUS

## 2023-06-13 NOTE — Progress Notes (Signed)
Your MRI results are back- The area of concern on your liver was determined to be a hemangioma. The radiologist does not recommend further imaging or testing at this time. This is usually a benign finding that typically does not warrant further evaluation or intervention.  Let us know if you have further questions or concerns.

## 2023-06-25 ENCOUNTER — Encounter: Payer: Medicare Other | Admitting: Cardiology

## 2023-06-28 ENCOUNTER — Ambulatory Visit: Payer: Medicare Other | Attending: Cardiology | Admitting: Cardiology

## 2023-06-28 VITALS — BP 126/90 | HR 82 | Ht 71.0 in | Wt 276.0 lb

## 2023-06-28 DIAGNOSIS — I5022 Chronic systolic (congestive) heart failure: Secondary | ICD-10-CM

## 2023-06-28 NOTE — Progress Notes (Signed)
Height: 5'11"    Weight:276lb BMI:38  Today's Date:06/28/23  STOP BANG RISK ASSESSMENT S (snore) Have you been told that you snore?     NO   T (tired) Are you often tired, fatigued, or sleepy during the day?   YES  O (obstruction) Do you stop breathing, choke, or gasp during sleep? YES   P (pressure) Do you have or are you being treated for high blood pressure? YES   B (BMI) Is your body index greater than 35 kg/m? YES   A (age) Are you 61 years old or older? YES   N (neck) Do you have a neck circumference greater than 16 inches?   YES   G (gender) Are you a female? NO   TOTAL STOP/BANG "YES" ANSWERS 6                                                                       For Office Use Only              Procedure Order Form    YES to 3+ Stop Bang questions OR two clinical symptoms - patient qualifies for WatchPAT (CPT 95800)      Clinical Notes: Will consult Sleep Specialist and refer for management of therapy due to patient increased risk of Sleep Apnea. Ordering a sleep study due to the following two clinical symptoms: Excessive daytime sleepiness G47.10 /Loud snoring R06.83 /  Unrefreshed by sleep G47.8 / / History of high blood pressure R03.0

## 2023-06-28 NOTE — Progress Notes (Signed)
PCP: Larae Grooms, NP Cardiology: Dr. Mariah Milling HF Cardiology: Dr. Shirlee Latch  62 y.o. with history of OSA, type 2 diabetes, CSF leak requiring VP shunt, and nonischemic cardiomyopathy was referred by Dr. Mariah Milling for CHF clinic evaluation.  Patient's cardiomyopathy was diagnosed in 2018 in [place], cath at that time showed no significant CAD.  She subsequently relocated to [facility].  She was admitted to [facility] in 4/23 with PNA and CHF, echo showed EF 40-45%.  Coronary CTA done in 5/23 showed no CAD, calcium score 0.  Cardiac MRI in 10/23 showed EF 32%, no LGE, RV EF 47%.  Most recent echo in 1/24 showed EF 40-45%, mild LV dilation, normal RV, moderate MR, IVC normal.  Patient does not have a strong family history of cardiomyopathy, her father had CHF but developed after he had renal failure requiring HD.  No drinking or drugs.    CPX in 4/24 showed submaximal study with moderate lung restriction, no significant HF limitation, primarily limited by obesity/deconditioning.   Patient returns for followup of CHF.  She still has pain in her left leg when walking, runs down the back of the leg.  ABIs in 8/24 were normal.  She still gets short of breath walking about 2 blocks.  No orthopnea/PND.  She snores and has daytime fatigue but has been off CPAP (it was taken away because she was not using it).  Weight up 1 lb despite semaglutide use.    Labs (10/23): LDL 91, K 4.4, creatinine 5.95 Labs (2/24): LDL 41, BNP 38, K 3.7, creatinine 1.11 Labs (5/24): K 3.8, creatinine 1.12  PMH: 1. OSA: CPAP 2. Type 2 diabetes 3. CSF leak: Repairs in 2014 and 2017, VP shunt placed.  4. H/o spinal fusion 5. Chronic systolic CHF: nonischemic cardiomyopathy.  Diagnosed in [place] around 2018.  - Echo (2018): EF 45%.  - LHC (2018): No significant CAD.  - Echo (8/19): EF 45% - Echo (7/22): EF 55% - Echo (4/23): EF 40-45%, normal RV.  - Coronary CTA (5/23): Coronary artery calcium score 0, no CAD.  - Echo (9/23): EF 35-40%, normal RV -  cMRI (10/23): EF 32%, no LGE, RV EF 47%.  - Echo (1/24): EF 40-45%, mild LV dilation, normal RV, moderate MR, IVC normal.  - CPX (4/24): Submaximal with moderate lung restriction in setting of obesity, no significant HF limitation but she has a moderate functional limitation due to obesity/deconditioning.  6. 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  No smoking, ETOH, drugs.   FH: Father with ESRD and CHF, siblings with no cardiac issues.   ROS: All systems reviewed and negative except as per HPI.   Current Outpatient Medications  Medication Sig Dispense Refill   acetaminophen (TYLENOL) 500 MG tablet Take 500 mg by mouth every 6 (six) hours as needed.     aspirin 81 MG chewable tablet Chew 1 tablet (81 mg total) by mouth daily. 30 tablet 3   carvedilol (COREG) 25 MG tablet Take 1 tablet (25 mg total) by mouth 2 (two) times daily. 180 tablet 3   empagliflozin (JARDIANCE) 25 MG TABS tablet Take 1 tablet (25 mg total) by mouth daily before breakfast. To replace 10mg - place on hold until patient requests please 90 tablet 0   gabapentin (NEURONTIN) 300 MG capsule Take 1 capsule (300 mg total) by mouth 3 (three) times daily. 270 capsule 1   insulin glargine (LANTUS SOLOSTAR) 100 UNIT/ML Solostar Pen Inject 30 Units into the skin daily.  15 mL 1   insulin lispro (HUMALOG KWIKPEN) 100 UNIT/ML KwikPen Inject 6 Units into the skin 3 (three) times daily. With meals.  This replaces Novolin R. 15 mL 11   metFORMIN (GLUCOPHAGE) 1000 MG tablet Take 1 tablet (1,000 mg total) by mouth 2 (two) times daily with a meal. 180 tablet 1   omeprazole (PRILOSEC) 20 MG capsule Take 1 capsule (20 mg total) by mouth daily. 30 capsule 2   rosuvastatin (CRESTOR) 10 MG tablet Take 1 tablet (10 mg total) by mouth daily. 90 tablet 1   sacubitril-valsartan (ENTRESTO) 97-103 MG Take 1 tablet by mouth 2 (two) times daily. 180 tablet 0   Semaglutide,0.25 or 0.5MG /DOS, (OZEMPIC, 0.25 OR 0.5 MG/DOSE,) 2 MG/1.5ML  SOPN Inject 0.5 mg into the skin once a week. 4 mL 0   spironolactone (ALDACTONE) 25 MG tablet TAKE 1 TABLET(25 MG) BY MOUTH DAILY 90 tablet 1   torsemide (DEMADEX) 20 MG tablet Take 2 tablets (40 mg total) by mouth daily for 2 days, THEN 1 tablet (20 mg total) daily. 30 tablet 4   No current facility-administered medications for this visit.   BP (!) 126/90   Pulse 82   Ht 5\' 11"  (1.803 m)   Wt 276 lb (125.2 kg)   SpO2 100%   BMI 38.49 kg/m  General: NAD Neck: No JVD, no thyromegaly or thyroid nodule.  Lungs: Clear to auscultation bilaterally with normal respiratory effort. CV: Nondisplaced PMI.  Heart regular S1/S2, no S3/S4, no murmur.  No peripheral edema.  No carotid bruit.  Normal pedal pulses.  Abdomen: Soft, nontender, no hepatosplenomegaly, no distention.  Skin: Intact without lesions or rashes.  Neurologic: Alert and oriented x 3.  Psych: Normal affect. Extremities: No clubbing or cyanosis.  HEENT: Normal.   Assessment/Plan: 1. Chronic systolic CHF: Nonischemic cardiomyopathy.  Cath in 2018 and coronary CTA in 5/23 showed no coronary disease.  Cardiac MRI in 10/23 showed  EF 32%, no LGE, RV EF 47%, and last echo in 1/24 showed EF 40-45%, mild LV dilation, normal RV, moderate MR, IVC normal.  She does not have a history of ETOH or drug abuse.  She does not have a strong family history of cardiomyopathy.  Dyspnea has seemed out of proportion to exam (NYHA class III), CPX in 4/24 suggests that her predominant limitation is obesity/deconditioning. She is not volume overloaded on exam, weight is stable.   - Continue torsemide 20 mg daily, BMET/BNP today.  - Continue Coreg 25 mg bid.  - Continue empagliflozin 10 daily - Continue Entresto 97/103 bid - Continue spironolactone 25 daily.  - Consider Bidil in future if EF declines.  - Would repeat echo in 1/25.  2. Left leg pain: With ambulation it is worse.  ABIs normal.  I think that this may be sciatica.  - Followup with PCP for  evaluation.   3. OSA:  Wants to get CPAP back.   - Will need another sleep study, arrange home sleep study.  4. Obesity: Needs significant weight loss.   - She is on semaglutide.  - Increase activity.    Followup in 4 months with same-day echo.   Marca Ancona 06/28/2023

## 2023-06-28 NOTE — Patient Instructions (Addendum)
   Lab Work:  Labs done today, your results will be available in MyChart, we will contact you for abnormal readings.   Testing/Procedures:  Your physician has requested that you have an echocardiogram. Echocardiography is a painless test that uses sound waves to create images of your heart. It provides your doctor with information about the size and shape of your heart and how well your heart's chambers and valves are working. This procedure takes approximately one hour. There are no restrictions for this procedure. Please do NOT wear cologne, perfume, aftershave, or lotions (deodorant is allowed). Please arrive 15 minutes prior to your appointment time. 10/31/2023 @ 10am  Your provider has recommended that you have a home sleep study ([facility] Test).  We have provided you with the equipment in our office today. Please go ahead and download the app. DO NOT OPEN OR TAMPER WITH THE BOX UNTIL WE ADVISE YOU TO DO SO. Once insurance has approved the test our office will call you with PIN number and approval to proceed with testing. Once you have completed the test you just dispose of the equipment, the information is automatically uploaded to [place] via blue-tooth technology. If your test is positive for sleep apnea and you need a home CPAP machine you will be contacted by Dr Norris Cross office [facility]) to set this up.      Special Instructions // Education:  Do the following things EVERYDAY: Weigh yourself in the morning before breakfast. Write it down and keep it in a log. Take your medicines as prescribed Eat low salt foods--Limit salt (sodium) to 2000 mg per day.  Stay as active as you can everyday Limit all fluids for the day to less than 2 liters   Follow-Up in: Please call in December to schedule your January appointment with Dr. Shirlee Latch.     If you have any questions or concerns before your next appointment please send [place] a message through [place] or call our office at [phone]  Monday-Friday 8 am-5 pm.   If you have an urgent need after hours on the weekend please call your Primary Cardiologist or the [facility] in [place] at [phone].

## 2023-06-30 LAB — BASIC METABOLIC PANEL
BUN/Creatinine Ratio: 9 — ABNORMAL LOW (ref 12–28)
BUN: 9 mg/dL (ref 8–27)
CO2: 21 mmol/L (ref 20–29)
Calcium: 9.8 mg/dL (ref 8.7–10.3)
Chloride: 106 mmol/L (ref 96–106)
Creatinine, Ser: 1.01 mg/dL — ABNORMAL HIGH (ref 0.57–1.00)
Glucose: 85 mg/dL (ref 70–99)
Potassium: 4.7 mmol/L (ref 3.5–5.2)
Sodium: 142 mmol/L (ref 134–144)
eGFR: 64 mL/min/{1.73_m2} (ref >59)

## 2023-06-30 LAB — BRAIN NATRIURETIC PEPTIDE: BNP: 220.5 pg/mL — ABNORMAL HIGH (ref 0.0–100.0)

## 2023-07-16 ENCOUNTER — Telehealth: Payer: Self-pay

## 2023-07-16 NOTE — Telephone Encounter (Signed)
PAP: PAP application for [employer]) has been mailed to [employer] home address on file. Will fax provider portion of application to provider's office when pt's portion is received.   Please be advises this is 2nd attempt I spoke to pt and told her I was going to re mail out application.

## 2023-07-16 NOTE — Telephone Encounter (Signed)
Notified patient they are approved to complete their home sleep study, no pre-authorization required. Patient given PIN of [id number], and reviewed how to complete. Pt instructed to call with any questions or concerns. Pt verbalized understanding and agreement.

## 2023-07-19 ENCOUNTER — Encounter (INDEPENDENT_AMBULATORY_CARE_PROVIDER_SITE_OTHER): Payer: Medicare Other | Admitting: Cardiology

## 2023-07-19 DIAGNOSIS — G4733 Obstructive sleep apnea (adult) (pediatric): Secondary | ICD-10-CM | POA: Diagnosis not present

## 2023-07-21 ENCOUNTER — Ambulatory Visit: Payer: Medicare Other | Attending: Cardiology

## 2023-07-21 DIAGNOSIS — I5022 Chronic systolic (congestive) heart failure: Secondary | ICD-10-CM

## 2023-07-21 NOTE — Procedures (Signed)
Patient Information Study Date: 07/19/2023 Patient Name: Emily Hinton Patient ID: [id number] Birth Date: 03-Oct-1961 Age: 62 Gender: Female BMI: 38.6 (W=275 lb, H=5' 11'') Stopbang: 6 Referring Physician: Marca Ancona, MD  TEST DESCRIPTION: Home sleep apnea testing was completed using the WatchPat, a Type 1 device, utilizing peripheral arterial tonometry (PAT), chest movement, actigraphy, pulse oximetry, pulse rate, body position and snore.  AHI was calculated with apnea and hypopnea using valid sleep time as the denominator. RDI includes apneas, hypopneas, and RERAs.  The data acquired and the scoring of sleep and all associated events were performed in accordance with the recommended standards and specifications as outlined in the AASM Manual for the Scoring of Sleep and Associated Events 2.2.0 (2015).  FINDINGS:  1.  Severe Obstructive Sleep Apnea with AHI 37.5/hr.   2.  No Central Sleep Apnea with pAHIc 2.3/hr.  3.  Oxygen desaturations as low as 85%.  4.  Moderate snoring was present. O2 sats were < 88% for 2.3 min.  5.  Total sleep time was 8 hrs and 19 min.  6.  30.8% of total sleep time was spent in REM sleep.   7.  Normal sleep onset latency at 26 min.   8.  Shortened REM sleep onset latency at 55 min.   9.  Total awakenings were 8.  10. Arrhythmia detection:  None  DIAGNOSIS:   Severe Obstructive Sleep Apnea (G47.33)  RECOMMENDATIONS:   1.  Clinical correlation of these findings is necessary.  The decision to treat obstructive sleep apnea (OSA) is usually based on the presence of apnea symptoms or the presence of associated medical conditions such as Hypertension, Congestive Heart Failure, Atrial Fibrillation or Obesity.  The most common symptoms of OSA are snoring, gasping for breath while sleeping, daytime sleepiness and fatigue.   2.  Initiating apnea therapy is recommended given the presence of symptoms and/or associated conditions. Recommend  proceeding with one of the following:     a.  Auto-CPAP therapy with a pressure range of 5-20cm H2O.     b.  An oral appliance (OA) that can be obtained from certain dentists with expertise in sleep medicine.  These are primarily of use in non-obese patients with mild and moderate disease.     c.  An ENT consultation which may be useful to look for specific causes of obstruction and possible treatment options.     d.  If patient is intolerant to PAP therapy, consider referral to ENT for evaluation for hypoglossal nerve stimulator.   3.  Close follow-up is necessary to ensure success with CPAP or oral appliance therapy for maximum benefit.  4.  A follow-up oximetry study on CPAP is recommended to assess the adequacy of therapy and determine the need for supplemental oxygen or the potential need for Bi-level therapy.  An arterial blood gas to determine the adequacy of baseline ventilation and oxygenation should also be considered.  5.  Healthy sleep recommendations include:  adequate nightly sleep (normal 7-9 hrs/night), avoidance of caffeine after noon and alcohol near bedtime, and maintaining a sleep environment that is cool, dark and quiet.  6.  Weight loss for overweight patients is recommended.  Even modest amounts of weight loss can significantly improve the severity of sleep apnea.  7.  Snoring recommendations include:  weight loss where appropriate, side sleeping, and avoidance of alcohol before bed.  8.  Operation of motor vehicle should be avoided when sleepy.  Signature: Armanda Magic, MD; [facility]; Diplomat, American Board  of Sleep Medicine Electronically Signed: 07/21/2023 7:17:14 PM

## 2023-07-24 ENCOUNTER — Telehealth: Payer: Self-pay

## 2023-07-24 NOTE — Progress Notes (Unsigned)
07/24/2023  Patient ID: [name], female   DOB: [birth year], 61 y.o.   MRN: [id number]  Patient outreach to see if [name] has received the Entresto PAP application that was mailed to her on 10/1.  She states that she has been checking the mail daily but has not yet received this.  Informed patient that this can take several business days, so I will check back in with her next week.    Patient requesting refills on ASA 81mg  and Metformin 1000mg .  I informed her a prescription for ASA 81mg  [facility] to [facility] August with 3 additional refills; she will contact the pharmacy to request fill.  It does not appear that [facility] has an active prescription for her metformin, so I am pending a prescription for PCP to sign if in agreement.  [name], PharmD, DPLA

## 2023-07-25 MED ORDER — METFORMIN HCL 1000 MG PO TABS: 1000.0000 mg | ORAL_TABLET | Freq: Two times a day (BID) | ORAL | 1 refills | Status: DC

## 2023-07-28 DIAGNOSIS — I428 Other cardiomyopathies: Secondary | ICD-10-CM | POA: Diagnosis not present

## 2023-07-28 DIAGNOSIS — R110 Nausea: Secondary | ICD-10-CM | POA: Diagnosis not present

## 2023-07-28 DIAGNOSIS — G4730 Sleep apnea, unspecified: Secondary | ICD-10-CM | POA: Diagnosis not present

## 2023-07-28 DIAGNOSIS — R0602 Shortness of breath: Secondary | ICD-10-CM | POA: Diagnosis not present

## 2023-07-28 DIAGNOSIS — J984 Other disorders of lung: Secondary | ICD-10-CM | POA: Diagnosis not present

## 2023-07-28 DIAGNOSIS — G4733 Obstructive sleep apnea (adult) (pediatric): Secondary | ICD-10-CM | POA: Diagnosis not present

## 2023-07-28 DIAGNOSIS — R0781 Pleurodynia: Secondary | ICD-10-CM | POA: Diagnosis not present

## 2023-07-28 DIAGNOSIS — I110 Hypertensive heart disease with heart failure: Secondary | ICD-10-CM | POA: Diagnosis not present

## 2023-07-28 DIAGNOSIS — K920 Hematemesis: Secondary | ICD-10-CM | POA: Diagnosis not present

## 2023-07-28 DIAGNOSIS — R112 Nausea with vomiting, unspecified: Secondary | ICD-10-CM | POA: Diagnosis not present

## 2023-07-28 DIAGNOSIS — I517 Cardiomegaly: Secondary | ICD-10-CM | POA: Diagnosis not present

## 2023-07-28 DIAGNOSIS — Z982 Presence of cerebrospinal fluid drainage device: Secondary | ICD-10-CM | POA: Diagnosis not present

## 2023-07-28 DIAGNOSIS — R918 Other nonspecific abnormal finding of lung field: Secondary | ICD-10-CM | POA: Diagnosis not present

## 2023-07-28 DIAGNOSIS — I10 Essential (primary) hypertension: Secondary | ICD-10-CM | POA: Diagnosis not present

## 2023-07-28 DIAGNOSIS — R7982 Elevated C-reactive protein (CRP): Secondary | ICD-10-CM | POA: Diagnosis not present

## 2023-07-28 DIAGNOSIS — R160 Hepatomegaly, not elsewhere classified: Secondary | ICD-10-CM | POA: Diagnosis not present

## 2023-07-28 DIAGNOSIS — R0600 Dyspnea, unspecified: Secondary | ICD-10-CM | POA: Diagnosis not present

## 2023-07-28 DIAGNOSIS — I5022 Chronic systolic (congestive) heart failure: Secondary | ICD-10-CM | POA: Diagnosis not present

## 2023-07-28 DIAGNOSIS — I5020 Unspecified systolic (congestive) heart failure: Secondary | ICD-10-CM | POA: Diagnosis not present

## 2023-07-28 DIAGNOSIS — R1013 Epigastric pain: Secondary | ICD-10-CM | POA: Diagnosis not present

## 2023-07-28 DIAGNOSIS — R002 Palpitations: Secondary | ICD-10-CM | POA: Diagnosis not present

## 2023-07-28 DIAGNOSIS — E119 Type 2 diabetes mellitus without complications: Secondary | ICD-10-CM | POA: Diagnosis not present

## 2023-07-28 DIAGNOSIS — R000 Tachycardia, unspecified: Secondary | ICD-10-CM | POA: Diagnosis not present

## 2023-07-28 DIAGNOSIS — R9431 Abnormal electrocardiogram [ECG] [EKG]: Secondary | ICD-10-CM | POA: Diagnosis not present

## 2023-07-28 DIAGNOSIS — Z981 Arthrodesis status: Secondary | ICD-10-CM | POA: Diagnosis not present

## 2023-07-28 DIAGNOSIS — R042 Hemoptysis: Secondary | ICD-10-CM | POA: Diagnosis not present

## 2023-07-28 DIAGNOSIS — E785 Hyperlipidemia, unspecified: Secondary | ICD-10-CM | POA: Diagnosis not present

## 2023-07-31 ENCOUNTER — Telehealth: Payer: Self-pay

## 2023-07-31 DIAGNOSIS — G4733 Obstructive sleep apnea (adult) (pediatric): Secondary | ICD-10-CM

## 2023-07-31 NOTE — Telephone Encounter (Signed)
Notified patient of sleep study results and recommendations.All questions were answered and patient verbalized understanding. CPAP order placed through [facility] 07/31/23

## 2023-07-31 NOTE — Telephone Encounter (Signed)
-----   Message from Armanda Magic sent at 07/21/2023  7:18 PM EDT ----- Please let patient know that they have sleep apnea.  Recommend therapeutic CPAP titration for treatment of patient's sleep disordered breathing.  If unable to perform an in lab titration then initiate ResMed auto CPAP from 4 to 15cm H2O with heated humidity and mask of choice and overnight pulse ox on CPAP.

## 2023-08-01 NOTE — Telephone Encounter (Signed)
DME  CHANGE: [facility] OUT OF NETWORK  DME selection is [facility] Patient understands he will be contacted by [facility] to set up his cpap. Patient understands to call if [facility] does not contact him with new setup in a timely manner. Patient understands they will be called once confirmation has been received from [place] that they have received their new machine to schedule 10 week follow up appointment.   [facility] notified of new cpap order  Please add to airview Patient was grateful for the call and thanked me

## 2023-08-06 ENCOUNTER — Telehealth: Payer: Self-pay

## 2023-08-06 NOTE — Progress Notes (Signed)
08/06/2023  Patient ID: [name], female   DOB: 1961-07-05, 62 y.o.   MRN: [id number]  Outreach attempt to follow-up regarding Entresto PAP application mailed to the patient.  Wanting to make sure this has been received and reiterate mailing back into medication assistance team.  I was not able to reach the patient, but I did leave HIPAA compliant voicemail with  my direct phone number.  I will try to contact her again in 10-14 days if I have not heard back.  [name], PharmD, DPLA

## 2023-08-09 ENCOUNTER — Telehealth: Payer: Self-pay

## 2023-08-09 NOTE — Telephone Encounter (Signed)
Medication picked up by the patient.  

## 2023-08-09 NOTE — Telephone Encounter (Signed)
5 boxes of Ozempic received for the patient. Called and let her know that her medication was ready to be picked up.

## 2023-08-16 ENCOUNTER — Encounter: Payer: Self-pay | Admitting: Nurse Practitioner

## 2023-08-16 ENCOUNTER — Ambulatory Visit: Payer: Medicare Other | Admitting: Nurse Practitioner

## 2023-08-16 VITALS — BP 115/77 | HR 84 | Ht 71.0 in | Wt 271.4 lb

## 2023-08-16 DIAGNOSIS — Z982 Presence of cerebrospinal fluid drainage device: Secondary | ICD-10-CM

## 2023-08-16 DIAGNOSIS — E781 Pure hyperglyceridemia: Secondary | ICD-10-CM | POA: Diagnosis not present

## 2023-08-16 DIAGNOSIS — F339 Major depressive disorder, recurrent, unspecified: Secondary | ICD-10-CM | POA: Diagnosis not present

## 2023-08-16 DIAGNOSIS — F419 Anxiety disorder, unspecified: Secondary | ICD-10-CM

## 2023-08-16 DIAGNOSIS — I5020 Unspecified systolic (congestive) heart failure: Secondary | ICD-10-CM

## 2023-08-16 DIAGNOSIS — E1169 Type 2 diabetes mellitus with other specified complication: Secondary | ICD-10-CM

## 2023-08-16 DIAGNOSIS — Z794 Long term (current) use of insulin: Secondary | ICD-10-CM

## 2023-08-16 DIAGNOSIS — G4733 Obstructive sleep apnea (adult) (pediatric): Secondary | ICD-10-CM

## 2023-08-16 DIAGNOSIS — I10 Essential (primary) hypertension: Secondary | ICD-10-CM

## 2023-08-16 MED ORDER — METFORMIN HCL 1000 MG PO TABS: 1000.0000 mg | ORAL_TABLET | Freq: Two times a day (BID) | ORAL | 1 refills | Status: DC

## 2023-08-17 LAB — LIPID PANEL
Chol/HDL Ratio: 2.3 ratio (ref 0.0–4.4)
Cholesterol, Total: 108 mg/dL (ref 100–199)
HDL: 48 mg/dL
LDL Chol Calc (NIH): 42 mg/dL (ref 0–99)
Triglycerides: 97 mg/dL (ref 0–149)
VLDL Cholesterol Cal: 18 mg/dL (ref 5–40)

## 2023-08-17 LAB — COMPREHENSIVE METABOLIC PANEL
ALT: 15 [IU]/L (ref 0–32)
AST: 15 [IU]/L (ref 0–40)
Albumin: 4.2 g/dL (ref 3.8–4.9)
Alkaline Phosphatase: 64 [IU]/L (ref 44–121)
BUN/Creatinine Ratio: 14 (ref 12–28)
BUN: 15 mg/dL (ref 8–27)
Bilirubin Total: 0.4 mg/dL (ref 0.0–1.2)
CO2: 24 mmol/L (ref 20–29)
Calcium: 9.4 mg/dL (ref 8.7–10.3)
Chloride: 106 mmol/L (ref 96–106)
Creatinine, Ser: 1.07 mg/dL — ABNORMAL HIGH (ref 0.57–1.00)
Globulin, Total: 3.1 g/dL (ref 1.5–4.5)
Glucose: 100 mg/dL — ABNORMAL HIGH (ref 70–99)
Potassium: 4.7 mmol/L (ref 3.5–5.2)
Sodium: 143 mmol/L (ref 134–144)
Total Protein: 7.3 g/dL (ref 6.0–8.5)
eGFR: 59 mL/min/{1.73_m2} — ABNORMAL LOW (ref >59)

## 2023-08-17 LAB — HEMOGLOBIN A1C
Est. average glucose Bld gHb Est-mCnc: 146 mg/dL
Hgb A1c MFr Bld: 6.7 % — ABNORMAL HIGH (ref 4.8–5.6)

## 2023-08-18 NOTE — Assessment & Plan Note (Signed)
Chronic.  Well controlled.  On Jardiance via PAP.  Patient on Lantus 30u daily.  Humalog 6u TID.  Endorses sugars of 130s.  Recent A1c was 6.4%. Needs uptdated eye exam.  Labs ordered at visit today.  Follow up in 3 months.  Call sooner if concerns arise.

## 2023-08-18 NOTE — Assessment & Plan Note (Signed)
Chronic.  Not well controlled.  Patient stopped taking Celexa.  Does not feel like she needs it at this time.  No safety concerns.  Will reassess at next visit.  Labs ordered today.  Return to clinic in 3 months for reevaluation.  Call sooner if concerns arise.

## 2023-08-18 NOTE — Assessment & Plan Note (Signed)
Recommended eating smaller high protein, low fat meals more frequently and exercising 30 mins a day 5 times a week with a goal of 10-15lb weight loss in the next 3 months.  Continue with Ozempic.

## 2023-08-18 NOTE — Assessment & Plan Note (Signed)
Chronic.  Followed by HF clinic.  Echo report from 06/21/22 reviewed and showed an EF of 35-40% with mild MR.  Patient is not weighing daily.  Encouraged her to weigh herself.  Continue with [place] and Entresto.  Continue to follow up with HF clinic.     - Reminded to call for an overnight weight gain of >2 pounds or a weekly weight gain of >5 pounds - not adding salt to food and read food labels. Reviewed the importance of keeping daily sodium intake to 2000mg  daily. - Avoid Ibuprofen products.

## 2023-08-18 NOTE — Assessment & Plan Note (Signed)
Chronic.  Controlled.  Continue with current medication regimen of Carvedilol and Valsartan (Entresto).  Labs ordered today.  Return to clinic in 3 months for reevaluation.  Call sooner if concerns arise.   

## 2023-08-18 NOTE — Assessment & Plan Note (Signed)
Has not seen Neurosurgery is several years.  Discussed establishing care with neurosurgeon in [place].

## 2023-08-18 NOTE — Assessment & Plan Note (Signed)
Labs ordered at visit today.  Will make recommendations based on lab results.  Continue Crestor 10mg .  Will increase dose if needed based on lab results.  Follow up in 3 months.  Call sooner if concerns arise.

## 2023-08-20 NOTE — Telephone Encounter (Signed)
RECEIVED PT PAGES FOR ENTRESTO 97/103MG (NOVARTIS)  TO PROVIDER OFFICE OF RYAN DUNN.   PLEASE BE ADVISED  Melanee Spry CPhT [facility] [phone]

## 2023-08-26 ENCOUNTER — Other Ambulatory Visit (HOSPITAL_COMMUNITY): Payer: Self-pay

## 2023-08-26 NOTE — Progress Notes (Signed)
Pharmacy Medication Assistance Program Note    08/26/2023  Patient ID: Emily Hinton, female   DOB: 1961/05/09, 62 y.o.   MRN: [id number]     08/26/2023  Outreach Medication One  Initial Outreach Date (Medication One) 07/11/2023  Manufacturer Medication One [employer]  [employer] Entresto  Dose of Entresto 97-103 MG  Type of Assistance Manufacturer Assistance  Date Application Sent to Patient 07/22/2023  Application Items Requested Application;Proof of Income  Date Application Sent to Prescriber 08/16/2023  Name of Prescriber KAREN HOLDSWORTH  Date Application Received From Patient 08/20/2023  Application Items Received From Patient Application;Proof of Income  Date Application Received From Provider 08/26/2023  Date Application Submitted to Manufacturer 08/26/2023  Method Application Sent to [employer]

## 2023-08-27 ENCOUNTER — Other Ambulatory Visit (HOSPITAL_COMMUNITY): Payer: Self-pay

## 2023-08-31 ENCOUNTER — Other Ambulatory Visit: Payer: Self-pay | Admitting: Nurse Practitioner

## 2023-09-01 ENCOUNTER — Other Ambulatory Visit: Payer: Self-pay | Admitting: Physician Assistant

## 2023-09-02 NOTE — Telephone Encounter (Signed)
Requested Prescriptions  Pending Prescriptions Disp Refills   omeprazole (PRILOSEC) 20 MG capsule [Pharmacy Med Name: OMEPRAZOLE 20MG  CAPSULES] 30 capsule 2    Sig: TAKE 1 CAPSULE(20 MG) BY MOUTH DAILY     Gastroenterology: Proton Pump Inhibitors Passed - 09/01/2023 12:54 PM      Passed - Valid encounter within last 12 months    Recent Outpatient Visits           2 weeks ago Type 2 diabetes mellitus with other specified complication, with long-term current use of insulin (HCC)   [place] [facility] Larae Grooms, NP   3 months ago Liver lesion, right lobe   [place] [facility] Mecum, Oswaldo Conroy, PA-C   3 months ago Failure to attend appointment   [place] [facility] Mecum, Oswaldo Conroy, PA-C   5 months ago HFrEF (heart failure with reduced ejection fraction) [facility])   [place] [facility] Larae Grooms, NP   8 months ago Type 2 diabetes mellitus with other specified complication, with long-term current use of insulin (HCC)   [place] [facility] Larae Grooms, NP       Future Appointments             In 2 weeks Dunn, Raymon Mutton, PA-C [facility] [place]   In 2 months Larae Grooms, NP [facility]

## 2023-09-02 NOTE — Telephone Encounter (Signed)
Requested by interface surescripts. Future visist in 2 months . Requested Prescriptions  Pending Prescriptions Disp Refills   gabapentin (NEURONTIN) 300 MG capsule [Pharmacy Med Name: GABAPENTIN 300MG  CAPSULES] 270 capsule 1    Sig: TAKE 1 CAPSULE(300 MG) BY MOUTH THREE TIMES DAILY     Neurology: Anticonvulsants - gabapentin Failed - 08/31/2023 11:20 AM      Failed - Cr in normal range and within 360 days    Creatinine, Ser  Date Value Ref Range Status  08/16/2023 1.07 (H) 0.57 - 1.00 mg/dL Final         Passed - Completed PHQ-2 or PHQ-9 in the last 360 days      Passed - Valid encounter within last 12 months    Recent Outpatient Visits           2 weeks ago Type 2 diabetes mellitus with other specified complication, with long-term current use of insulin (HCC)   [place] [facility] Larae Grooms, NP   3 months ago Liver lesion, right lobe   [place] [facility] Mecum, Oswaldo Conroy, PA-C   3 months ago Failure to attend appointment   [place] [facility] Mecum, Oswaldo Conroy, PA-C   5 months ago HFrEF (heart failure with reduced ejection fraction) [facility])   [place] [facility] Larae Grooms, NP   8 months ago Type 2 diabetes mellitus with other specified complication, with long-term current use of insulin (HCC)   [place] [facility] Larae Grooms, NP       Future Appointments             In 2 weeks Dunn, Raymon Mutton, PA-C [facility] [place]   In 2 months Larae Grooms, NP [place] [facility]

## 2023-09-09 ENCOUNTER — Other Ambulatory Visit: Payer: Self-pay | Admitting: Cardiovascular Disease

## 2023-09-13 ENCOUNTER — Other Ambulatory Visit: Payer: Self-pay

## 2023-09-13 NOTE — Progress Notes (Signed)
   09/13/2023  Patient ID: Emily Hinton, female   DOB: [birth year], 62 y.o.   MRN: [id number]  Outreach attempt to see if I can assist patient with Jardiance and Entresto PAP application, but I was not able to make contact.  I did leave HIPAA compliant voicemail with my direct phone number, so she can return my call.  [name], PharmD, DPLA

## 2023-09-19 NOTE — Progress Notes (Signed)
Cardiology Office Note    Date:  09/20/2023   ID:  [name], DOB 1961-06-01, MRN [id number]  PCP:  Larae Grooms, NP  Cardiologist:  Julien Nordmann, MD  Electrophysiologist:  None  Advanced Heart Failure: Shirlee Latch, MD  Chief Complaint: Follow-up  History of Present Illness:   Emily Hinton is a 62 y.o. 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   [name] was previously followed by cardiology in [place]. She was found to have a cardiomyopathy in 2018 with an EF of 45%. LHC at that time showed nonobstructive coronary arteries.  Echo in 05/2018 demonstrated a persistent cardiomyopathy with an EF of 45%, global hypokinesis, and diastolic dysfunction. This finding was unchanged when compared to echo in 2018.  Subsequent echo in 04/2021 showed an improved LV systolic function with an EF of 55%, probable normal LV wall motion, and diastolic dysfunction. She moved to [facility] from [place] several years ago.    She was admitted to the hospital in 01/2022 with worsening dyspnea and productive cough and found to have multilobar pneumonia with mild volume overload.  She was hypertensive and tachycardic upon presentation with an oxygen saturation of 72% on room air.  High-sensitivity troponin 35 with a delta troponin of 20.  BNP 189.  Chest x-ray showed volume overload versus pneumonia with small pleural effusions.  CT of the chest showed multilobar pneumonia with minimal pleural fluid on the left.  Echo during this admission showed an EF of 40-45%, mildly to moderately dilated LV  internal cavity size, global hypokinesis, Gr2DD, normal RV systolic function and ventricular cavity size, and mild mitral regurgitation.     Coronary CTA on 03/01/2022 showed a calcium score of 0, with no evidence of CAD.  Noncardiac over read was notable for near complete resolution of groundglass opacities when compared to imaging from 01/2022.  There were bandlike changes along the posterior pleural space felt to likely reflect a mixture of atelectasis and postinflammatory changes that were also improving.  It was recommended she follow-up with her PCP for these improving changes.   She was seen in the office on 04/12/2022 and was without symptoms of angina or decompensation.  She had stable 2-3 pillow orthopnea given prior CSF leak.  She remained on Entresto and had been started on lisinopril by outside office.  It was recommended that she discontinue lisinopril.  Amlodipine was also discontinued and Entresto was titrated to 49/51 mg twice daily.  She was otherwise continued on carvedilol, spironolactone, torsemide, and Jardiance.   She was seen in the office on 05/17/2022 and continued to do well from a cardiac perspective, without symptoms of angina or decompensation.  Her weight was stable by our scale.  Entresto was titrated to 97/103 mg twice daily and carvedilol was titrated to 12.5 mg twice daily.  She was otherwise continued on Jardiance, spironolactone, and torsemide.  Labs showed mild AKI at that time leading torsemide to be transitioned to as needed.  Follow-up limited echo 06/21/2022 showed a persistent cardiomyopathy with an EF of 35 to 40%, global hypokinesis, mildly dilated internal LV cavity size, grade 1  diastolic dysfunction, normal RV systolic function and ventricular cavity size, mild mitral regurgitation, and an estimated right atrial pressure of 3 mmHg.     She was seen in the office on 07/05/2022 and remained without symptoms of angina or decompensation with stable chronic dyspnea.  Her  weight was stable.  Carvedilol was titrated to 25 mg daily with continuation of Entresto, Jardiance, spironolactone, and torsemide.  Cardiac MRI on 07/18/2022 demonstrated an LVEF of 32% with no evidence of MI, scar, or LGE with normal RV systolic function and ventricular cavity size and no significant valvular abnormalities.  Findings were suggestive of nonischemic cardiomyopathy.   Given persistent cardiomyopathy despite optimization of GDMT, she was referred to the advanced heart failure service and evaluated by them on 11/30/2022, at which time she was noted to have a mildly elevated ReDs vest of 36%.  Her dyspnea was felt to be somewhat out of proportion to her exam.  Torsemide was briefly titrated.  CPX was a submaximal test with the resting spirometry suggestive of moderate restrictive lung function in the setting of severe obesity.  Exercise testing revealed a moderate functional limitation due to primarily obesity and deconditioning.  There was mild chronotropic incompetence in the setting of submaximal aerobic effort with no other evidence of significant heart failure limitation.  She followed up with advanced heart failure on 02/22/2023 with continued easy fatigability and shortness of breath when walking upstairs.  She was without frank angina.  She had been on semaglutide, though had not increased the dose and has not lost weight.  Her weight was up 3 pounds by their scale, though was not volume up.  Her dyspnea was out of proportion to her exam with findings suggestive of main limitation being obesity and deconditioning.  She was referred to our pharmacy team for bariatric management.   She was seen in the office in 03/2023 and without symptoms of angina or cardiac decompensation.  Chronic fatigue was stable.  No changes were indicated.  Follow-up chest CT for pulmonary nodule 04/26/2023 showed interval resolution of airspace opacities with no suspicious pulmonary nodule.  Partially visualized  indeterminate lesion of the right hepatic lobe measuring 1.5 cm.  She was advised to follow-up with PCP for liver lesion.  She was last seen by general cardiology in 05/2023 and felt like there was some improvement in her chronic fatigue and dyspnea.  She was out of Jardiance secondary to financial constraints and did not qualify for patient assistance.  She declined addition of BiDil.  She followed up with advanced heart failure service in 06/2023 and was euvolemic with dyspnea out of proportion to exam.  No changes in pharmacotherapy were indicated at that time.  She was admitted to the hospital in [place] in 07/2023 with episodes of hemoptysis and blood-streaked emesis and signs of infectious pneumonitis with inflammatory changes felt to be less likely.  No evidence of PE on CTA chest.  She had traveled from [place] to [place], approximately 9-hour trip.  She was treated with ceftriaxone and azithromycin with transition to cefpodoxime and azithromycin as well as tranexamic as needed nebs with considerable improvement in hemoptysis.  Bronchoscopy showed no signs of pulmonary hemorrhage bilaterally to the subsegmental level.  Serial BAL's were performed in the right middle lobe with no grossly emergent return.  Bronchial culture, AFB and fungal culture with no growth.  Pro-BNP 830.  High sensitivity troponin 21 with a delta troponin of 20.  She comes in doing well from a  cardiac perspective and is without symptoms of angina or cardiac decompensation.  She does continue to note chronic stable fatigue and dyspnea.  No dizziness, presyncope, or syncope.  Her weight is down 12 pounds today when compared to her last visit in 05/2023, this is intentional with dietary modification and Ozempic.  She is adherent and tolerating cardiac medications.  Monitoring salt intake.  No progressive orthopnea.   Labs independently reviewed: 08/2023 - A1c 6.7, TC 108, TG 97, HDL 40, LDL 42, BUN 15, serum creatinine 1.07, potassium  4.7, albumin 4.2, AST/ALT normal 07/2023 - magnesium 2.1, TSH normal, Hgb 15.2, PLT 344  Past Medical History:  Diagnosis Date   CHF (congestive heart failure) (HCC)    CSF leak    Diabetes mellitus without complication (HCC)    DM2 (diabetes mellitus, type 2) (HCC)    Essential hypertension    Heart attack (HCC)    Hyperlipidemia    Morbid obesity (HCC)    NICM (nonischemic cardiomyopathy) (HCC)    Nonobstructive atherosclerosis of coronary artery    OSA (obstructive sleep apnea)     Past Surgical History:  Procedure Laterality Date   CERVICAL FUSION  2017   CSF leak repair x 2     2014 and 2017   CSF SHUNT  2017    Current Medications: Current Meds  Medication Sig   acetaminophen (TYLENOL) 500 MG tablet Take 500 mg by mouth every 6 (six) hours as needed.   carvedilol (COREG) 25 MG tablet Take 1 tablet (25 mg total) by mouth 2 (two) times daily.   empagliflozin (JARDIANCE) 25 MG TABS tablet Take 1 tablet (25 mg total) by mouth daily before breakfast. To replace 10mg - place on hold until patient requests please   gabapentin (NEURONTIN) 300 MG capsule TAKE 1 CAPSULE(300 MG) BY MOUTH THREE TIMES DAILY   insulin glargine (LANTUS SOLOSTAR) 100 UNIT/ML Solostar Pen Inject 30 Units into the skin daily.   insulin lispro (HUMALOG KWIKPEN) 100 UNIT/ML KwikPen Inject 6 Units into the skin 3 (three) times daily. With meals.  This replaces Novolin R.   metFORMIN (GLUCOPHAGE) 1000 MG tablet Take 1 tablet (1,000 mg total) by mouth 2 (two) times daily with a meal.   omeprazole (PRILOSEC) 20 MG capsule TAKE 1 CAPSULE(20 MG) BY MOUTH DAILY   rosuvastatin (CRESTOR) 10 MG tablet Take 1 tablet (10 mg total) by mouth daily.   sacubitril-valsartan (ENTRESTO) 97-103 MG Take 1 tablet by mouth 2 (two) times daily.   Semaglutide,0.25 or 0.5MG /DOS, (OZEMPIC, 0.25 OR 0.5 MG/DOSE,) 2 MG/1.5ML SOPN Inject 0.5 mg into the skin once a week.   spironolactone (ALDACTONE) 25 MG tablet TAKE 1 TABLET(25 MG) BY  MOUTH DAILY   torsemide (DEMADEX) 20 MG tablet Take 2 tablets (40 mg total) by mouth daily for 2 days, THEN 1 tablet (20 mg total) daily.   [DISCONTINUED] aspirin 81 MG chewable tablet Chew 1 tablet (81 mg total) by mouth daily.    Allergies:   Cashew nut (anacardium occidentale) skin test and Cashew nut oil   Social History   Socioeconomic History   Marital status: Single    Spouse name: Not on file   Number of children: Not on file   Years of education: Not on file   Highest education level: Not on file  Occupational History   Not on file  Tobacco Use   Smoking status: Never   Smokeless tobacco: Never  Vaping Use   Vaping status: Never Used  Substance and Sexual  Activity   Alcohol use: Never   Drug use: Never   Sexual activity: Not on file  Other Topics Concern   Not on file  Social History Narrative   Not on file   Social Determinants of Health   Financial Resource Strain: Medium Risk (01/28/2023)   Overall Financial Resource Strain (CARDIA)    Difficulty of Paying Living Expenses: Somewhat hard  Food Insecurity: No Food Insecurity (01/28/2023)   Hunger Vital Sign    Worried About Running Out of Food in the Last Year: Never true    Ran Out of Food in the Last Year: Never true  Transportation Needs: No Transportation Needs (01/28/2023)   PRAPARE - Administrator, Civil Service (Medical): No    Lack of Transportation (Non-Medical): No  Physical Activity: Insufficiently Active (01/28/2023)   Exercise Vital Sign    Days of Exercise per Week: 2 days    Minutes of Exercise per Session: 20 min  Stress: No Stress Concern Present (01/28/2023)   [employer] of Occupational Health - Occupational Stress Questionnaire    Feeling of Stress : Not at all  Social Connections: Moderately Isolated (01/28/2023)   Social Connection and Isolation Panel [NHANES]    Frequency of Communication with Friends and Family: More than three times a week    Frequency of Social  Gatherings with Friends and Family: Three times a week    Attends Religious Services: 1 to 4 times per year    Active Member of Clubs or Organizations: No    Attends Banker Meetings: Never    Marital Status: Divorced     Family History:  The patient's family history includes Heart failure in her father; Hypertension in her father; Kidney disease in her father. There is no history of Breast cancer.  ROS:   12-point review of systems is negative unless otherwise noted in the HPI.   EKGs/Labs/Other Studies Reviewed:    Studies reviewed were summarized above. The additional studies were reviewed today:  2D echo 01/30/2022: 1. Left ventricular ejection fraction, by estimation, is 40 to 45%. The  left ventricle has mildly decreased function. The left ventricle  demonstrates global hypokinesis. The left ventricular internal cavity size  was mildly to moderately dilated. Left  ventricular diastolic parameters are consistent with Grade II diastolic  dysfunction (pseudonormalization). Elevated left atrial pressure.   2. Right ventricular systolic function is normal. The right ventricular  size is normal. Tricuspid regurgitation signal is inadequate for assessing  PA pressure.   3. The mitral valve is normal in structure. Mild mitral valve  regurgitation. No evidence of mitral stenosis.   4. The aortic valve was not well visualized. Aortic valve regurgitation  is not visualized. No aortic stenosis is present. __________   Coronary CTA 03/01/2022: Noncardiac over read -  1. Near complete resolution of ground-glass opacities in the chest when compared to the recent study from April of 2023. 2. Bandlike changes along the posterior pleural surface likely a mixture of atelectasis and post inflammatory changes also showing improvement since previous imaging. This may reflect developing post inflammatory fibrosis with and is mixed atelectatic changes at the lung bases, suggest  attention on follow-up.   Cardiac overread -  Aorta:  Normal size.  No calcifications.  No dissection.   Aortic Valve:  Trileaflet.  No calcifications.   Coronary Arteries:  Normal coronary origin.  Right dominance.   RCA is a dominant artery that gives rise to PDA and PLA. There  is no plaque.   Left main gives rise to LAD and LCX arteries. There is no LM disease.   LAD has no plaque.   LCX is a non-dominant artery that gives rise to a large obtuse marginal branch. There is no plaque.   Other findings:   Normal pulmonary vein drainage into the left atrium.   Normal left atrial appendage without a thrombus.   Normal size of the pulmonary artery.   IMPRESSION: 1. Normal coronary calcium score of 0. Patient is low risk for coronary events. 2. Normal coronary origin with right dominance. 3. No evidence of CAD. 4. CAD-RADS 0. Consider non-atherosclerotic causes of chest pain. __________   Limited echo 06/21/2022: 1. Left ventricular ejection fraction, by estimation, is 35 to 40%. The  left ventricle has moderately decreased function. The left ventricle  demonstrates global hypokinesis. The left ventricular internal cavity size  was mildly dilated. Left ventricular  diastolic parameters are consistent with Grade I diastolic dysfunction  (impaired relaxation).   2. Right ventricular systolic function is normal. The right ventricular  size is normal. Tricuspid regurgitation signal is inadequate for assessing  PA pressure.   3. The mitral valve is normal in structure. Mild mitral valve  regurgitation. No evidence of mitral stenosis.   4. The aortic valve is normal in structure. Aortic valve regurgitation is  not visualized. No aortic stenosis is present.   5. The inferior vena cava is normal in size with greater than 50%  respiratory variability, suggesting right atrial pressure of 3 mmHg. __________   Cardiac MRI 07/18/2022: FINDINGS: 1. Mildly dilated left ventricular  size. Moderately reduced LV systolic function (LVEF = 32%). There is global hypokinesis with no regional wall motion abnormalities.   There is no late gadolinium enhancement in the left ventricular myocardium.   LVEDV: 276 ml   LVESV: 187 ml   SV: 89 ml   CO: 4.9 L/min   Myocardial mass: 152   2. Normal right ventricular size, thickness and low normal systolic function (RVEF = 47%). There are no regional wall motion abnormalities.   3.  Normal left and right atrial size.   4. Normal size of the aortic root, ascending aorta and pulmonary artery. Normal pulmonic and aortic flow. Qp:Qs = 1.05   5.  Trace MR, Trace TR. no other significant valvular abnormalities.   6.  Normal pericardium.  No pericardial effusion.   IMPRESSION: 1.  Mildly dilated LV, moderately reduced LV function.  LVEF = 32% 2. No evidence for myocardial infiltration, late gadolinium enhancement or scar. 3.  No significant valvular abnormalities. 4.  Normal RV size and function 5.  Findings suggest non-ischemic cardiomyopathy. __________   Limited echo 11/02/2022: 1. Left ventricular ejection fraction, by estimation, is 40 to 45%. Left  ventricular ejection fraction by 3D volume is 43 %. The left ventricle has  mildly decreased function. The left ventricle demonstrates global  hypokinesis. The left ventricular  internal cavity size was mildly dilated. Left ventricular diastolic  parameters are indeterminate. The average left ventricular global  longitudinal strain is -8.5 %. The global longitudinal strain is abnormal.   2. Right ventricular systolic function is normal. The right ventricular  size is normal.   3. Left atrial size was mildly dilated.   4. The mitral valve is normal in structure. Moderate mitral valve  regurgitation. No evidence of mitral stenosis.   5. The aortic valve is normal in structure. Aortic valve regurgitation is  not visualized. No aortic stenosis  is present.   6. The  inferior vena cava is normal in size with greater than 50%  respiratory variability, suggesting right atrial pressure of 3 mmHg.   Comparison(s): 06/21/2022-EF 35-40%.  __________   CPX 01/17/2023: Conclusion: The interpretation of this test is limited due to submaximal effort during the exercise. Based on available data, exercise testing with gas exchange demonstrates moderate functional impairment when compared to matched sedentary norms. Pre-exercise spirometry demonstrates moderate restrictive lung physiology, most likely related to body habitus. Patient appears limited due to deconditioning (due to previous medical conditions inhibiting physical activity) and body habitus with 13% improvement of PVO2 with corrections for ideal body weight.   EKG:  EKG is ordered today.  The EKG ordered today demonstrates NSR, 96 bpm, left axis deviation, poor R wave progression along the precordial leads, no acute st/t changes  Recent Labs: 06/28/2023: BNP 220.5 08/16/2023: ALT 15; BUN 15; Creatinine, Ser 1.07; Potassium 4.7; Sodium 143  Recent Lipid Panel    Component Value Date/Time   CHOL 108 08/16/2023 1144   TRIG 97 08/16/2023 1144   HDL 48 08/16/2023 1144   CHOLHDL 2.3 08/16/2023 1144   LDLCALC 42 08/16/2023 1144    PHYSICAL EXAM:    VS:  BP (!) 122/90 (BP Location: Left Arm, Patient Position: Sitting, Cuff Size: Normal)   Pulse 96   Wt 265 lb 12.8 oz (120.6 kg)   SpO2 98%   BMI 37.07 kg/m   BMI: Body mass index is 37.07 kg/m.  Physical Exam Vitals reviewed.  Constitutional:      Appearance: She is well-developed.  HENT:     Head: Normocephalic and atraumatic.  Eyes:     General:        Right eye: No discharge.        Left eye: No discharge.  Neck:     Vascular: No JVD.  Cardiovascular:     Rate and Rhythm: Normal rate and regular rhythm.     Heart sounds: Normal heart sounds, S1 normal and S2 normal. Heart sounds not distant. No midsystolic click and no opening snap. No murmur  heard.    No friction rub.  Pulmonary:     Effort: Pulmonary effort is normal. No respiratory distress.     Breath sounds: Normal breath sounds. No decreased breath sounds, wheezing, rhonchi or rales.  Chest:     Chest wall: No tenderness.  Abdominal:     General: There is no distension.  Musculoskeletal:     Cervical back: Normal range of motion.     Right lower leg: No edema.     Left lower leg: No edema.  Skin:    General: Skin is warm and dry.     Nails: There is no clubbing.  Neurological:     Mental Status: She is alert and oriented to person, place, and time.  Psychiatric:        Speech: Speech normal.        Behavior: Behavior normal.        Thought Content: Thought content normal.        Judgment: Judgment normal.     Wt Readings from Last 3 Encounters:  09/20/23 265 lb 12.8 oz (120.6 kg)  08/16/23 271 lb 6.4 oz (123.1 kg)  06/28/23 276 lb (125.2 kg)     ASSESSMENT & PLAN:   HFrEF secondary to NICM: She is euvolemic and well compensated with NYHA class II symptoms.  Continue carvedilol 25 mg twice daily, Jardiance, [employer]  97/23 mg twice daily, spironolactone 25 mg, and torsemide 20 mg.  Should she have further heart failure symptoms would consider the addition of BiDil.  Keep scheduled echo for next month.  Her persistent dyspnea and fatigue out of proportion with exam was seen BX earlier this year demonstrating a predominant limitation of obesity and deconditioning.  Continue to follow-up with advanced heart failure service.  Nonobstructive CAD: No symptoms suggestive of angina.  Coronary CTA showed calcium score of 0 with no evidence of CAD.  Continue aggressive risk factor modification and primary prevention including aspirin, carvedilol, and rosuvastatin.  No indication for further ischemic testing at this time.  HTN: Blood pressure is well-controlled in the office today.  Continue medical therapy as outlined above.  Class II obesity: Weight is down 12 pounds  when compared to her visit in 05/2023, this is intentional with dietary modification and Ozempic.  OSA: Continue with CPAP.    Disposition: F/u with Dr. Mariah Milling or an APP in 6 months.   Medication Adjustments/Labs and Tests Ordered: Current medicines are reviewed at length with the patient today.  Concerns regarding medicines are outlined above. Medication changes, Labs and Tests ordered today are summarized above and listed in the Patient Instructions accessible in Encounters.   Signed, Eula Listen, PA-C 09/20/2023 2:30 PM     [facility] [place] [address] [place], [place] [address] [phone]

## 2023-09-20 ENCOUNTER — Encounter: Payer: Self-pay | Admitting: Physician Assistant

## 2023-09-20 ENCOUNTER — Ambulatory Visit: Payer: Medicare Other | Attending: Physician Assistant | Admitting: Physician Assistant

## 2023-09-20 VITALS — BP 122/90 | HR 96 | Wt 265.8 lb

## 2023-09-20 DIAGNOSIS — Z6838 Body mass index (BMI) 38.0-38.9, adult: Secondary | ICD-10-CM

## 2023-09-20 DIAGNOSIS — I2510 Atherosclerotic heart disease of native coronary artery without angina pectoris: Secondary | ICD-10-CM | POA: Diagnosis not present

## 2023-09-20 DIAGNOSIS — G4733 Obstructive sleep apnea (adult) (pediatric): Secondary | ICD-10-CM | POA: Diagnosis not present

## 2023-09-20 DIAGNOSIS — I5020 Unspecified systolic (congestive) heart failure: Secondary | ICD-10-CM

## 2023-09-20 DIAGNOSIS — I428 Other cardiomyopathies: Secondary | ICD-10-CM | POA: Diagnosis not present

## 2023-09-20 DIAGNOSIS — I10 Essential (primary) hypertension: Secondary | ICD-10-CM | POA: Diagnosis not present

## 2023-09-20 DIAGNOSIS — E66812 Obesity, class 2: Secondary | ICD-10-CM

## 2023-09-20 MED ORDER — ASPIRIN 81 MG PO CHEW: 81.0000 mg | CHEWABLE_TABLET | Freq: Every day | ORAL | 3 refills | Status: AC

## 2023-09-20 NOTE — Patient Instructions (Signed)
Medication Instructions:  Your Physician recommend you continue on your current medication as directed.    *If you need a refill on your cardiac medications before your next appointment, please call your pharmacy*   Lab Work: None ordered at this time     Follow-Up: At [facility], you and your health needs are our priority.  As part of our continuing mission to provide you with exceptional heart care, we have created designated Provider Care Teams.  These Care Teams include your primary Cardiologist (physician) and Advanced Practice Providers (APPs -  Physician Assistants and Nurse Practitioners) who all work together to provide you with the care you need, when you need it.  We recommend signing up for the patient portal called "MyChart".  Sign up information is provided on this After Visit Summary.  MyChart is used to connect with patients for Virtual Visits (Telemedicine).  Patients are able to view lab/test results, encounter notes, upcoming appointments, etc.  Non-urgent messages can be sent to your provider as well.   To learn more about what you can do with MyChart, go to [email or web address].    Your next appointment:   6 month(s)  Provider:   You may see Julien Nordmann, MD or one of the following Advanced Practice Providers on your designated Care Team:   Eula Listen, [place]

## 2023-10-31 ENCOUNTER — Ambulatory Visit
Admission: RE | Admit: 2023-10-31 | Discharge: 2023-10-31 | Disposition: A | Discharge status: Home / Self Care | Payer: Medicare Other | Source: Ambulatory Visit | Attending: Cardiology | Admitting: Cardiology

## 2023-10-31 DIAGNOSIS — I428 Other cardiomyopathies: Secondary | ICD-10-CM | POA: Diagnosis not present

## 2023-10-31 DIAGNOSIS — E785 Hyperlipidemia, unspecified: Secondary | ICD-10-CM | POA: Insufficient documentation

## 2023-10-31 DIAGNOSIS — I340 Nonrheumatic mitral (valve) insufficiency: Secondary | ICD-10-CM | POA: Insufficient documentation

## 2023-10-31 DIAGNOSIS — I5022 Chronic systolic (congestive) heart failure: Secondary | ICD-10-CM | POA: Insufficient documentation

## 2023-10-31 LAB — ECHOCARDIOGRAM COMPLETE
AR max vel: 2.47 cm2
AV Area VTI: 2.52 cm2
AV Area mean vel: 2.45 cm2
AV Mean grad: 3 mm[Hg]
AV Peak grad: 4.7 mm[Hg]
Ao pk vel: 1.09 m/s
Area-P 1/2: 5.13 cm2
Calc EF: 29.9 %
S' Lateral: 5.5 cm
Single Plane A2C EF: 38.6 %
Single Plane A4C EF: 31 %

## 2023-11-14 ENCOUNTER — Encounter: Payer: Self-pay | Admitting: Emergency Medicine

## 2023-11-14 ENCOUNTER — Emergency Department: Payer: Medicare Other

## 2023-11-14 ENCOUNTER — Other Ambulatory Visit: Payer: Self-pay

## 2023-11-14 DIAGNOSIS — Z20822 Contact with and (suspected) exposure to covid-19: Secondary | ICD-10-CM | POA: Diagnosis not present

## 2023-11-14 DIAGNOSIS — J811 Chronic pulmonary edema: Secondary | ICD-10-CM | POA: Diagnosis not present

## 2023-11-14 DIAGNOSIS — I700 Atherosclerosis of aorta: Secondary | ICD-10-CM | POA: Diagnosis not present

## 2023-11-14 DIAGNOSIS — J810 Acute pulmonary edema: Secondary | ICD-10-CM | POA: Diagnosis not present

## 2023-11-14 DIAGNOSIS — R0602 Shortness of breath: Secondary | ICD-10-CM | POA: Diagnosis not present

## 2023-11-14 DIAGNOSIS — I517 Cardiomegaly: Secondary | ICD-10-CM | POA: Diagnosis not present

## 2023-11-14 DIAGNOSIS — R059 Cough, unspecified: Secondary | ICD-10-CM | POA: Diagnosis not present

## 2023-11-14 LAB — CBC WITH DIFFERENTIAL/PLATELET
Abs Immature Granulocytes: 0.01 10*3/uL (ref 0.00–0.07)
Basophils Absolute: 0 10*3/uL (ref 0.0–0.1)
Basophils Relative: 0 %
Eosinophils Absolute: 0.1 10*3/uL (ref 0.0–0.5)
Eosinophils Relative: 2 %
HCT: 43.8 % (ref 36.0–46.0)
Hemoglobin: 13.8 g/dL (ref 12.0–15.0)
Immature Granulocytes: 0 %
Lymphocytes Relative: 29 %
Lymphs Abs: 1.9 10*3/uL (ref 0.7–4.0)
MCH: 28.2 pg (ref 26.0–34.0)
MCHC: 31.5 g/dL (ref 30.0–36.0)
MCV: 89.4 fL (ref 80.0–100.0)
Monocytes Absolute: 0.6 10*3/uL (ref 0.1–1.0)
Monocytes Relative: 9 %
Neutro Abs: 3.9 10*3/uL (ref 1.7–7.7)
Neutrophils Relative %: 60 %
Platelets: 308 10*3/uL (ref 150–400)
RBC: 4.9 MIL/uL (ref 3.87–5.11)
RDW: 16 % — ABNORMAL HIGH (ref 11.5–15.5)
WBC: 6.7 10*3/uL (ref 4.0–10.5)
nRBC: 0 % (ref 0.0–0.2)

## 2023-11-14 LAB — BASIC METABOLIC PANEL
Anion gap: 11 (ref 5–15)
BUN: 11 mg/dL (ref 8–23)
CO2: 24 mmol/L (ref 22–32)
Calcium: 9.4 mg/dL (ref 8.9–10.3)
Chloride: 109 mmol/L (ref 98–111)
Creatinine, Ser: 1.02 mg/dL — ABNORMAL HIGH (ref 0.44–1.00)
GFR, Estimated: >60 mL/min (ref >60)
Glucose, Bld: 102 mg/dL — ABNORMAL HIGH (ref 70–99)
Potassium: 5 mmol/L (ref 3.5–5.1)
Sodium: 144 mmol/L (ref 135–145)

## 2023-11-14 LAB — SARS CORONAVIRUS 2 BY RT PCR: SARS Coronavirus 2 by RT PCR: NEGATIVE

## 2023-11-14 LAB — TROPONIN I (HIGH SENSITIVITY): Troponin I (High Sensitivity): 22 ng/L — ABNORMAL HIGH (ref <18)

## 2023-11-14 NOTE — ED Triage Notes (Signed)
Pt to ED via POV c/o cough and shortness of breath. Pt states that she is having pain under her left breast that started after she started coughing yesterday. Pt states that she has hx/o CHF. Pt states that she sees Dr. Shea Evans.

## 2023-11-14 NOTE — ED Provider Triage Note (Signed)
Emergency Medicine Provider Triage Evaluation Note  Emily Hinton , a 63 y.o. female  was evaluated in triage.  Pt complains of left side chest pain, shortness of breath, and cough. History of CHF.  Physical Exam  BP 118/73 (BP Location: Left Arm)   Pulse 87   Temp 98 F (36.7 C)   Resp 16   Ht 5\' 11"  (1.803 m)   Wt 118.8 kg   SpO2 96%   BMI 36.54 kg/m  Gen:   Awake, no distress   Resp:  Normal effort  MSK:   Moves extremities without difficulty  Other:    Medical Decision Making  Medically screening exam initiated at 5:54 PM.  Appropriate orders placed.  Emily Hinton was informed that the remainder of the evaluation will be completed by another provider, this initial triage assessment does not replace that evaluation, and the importance of remaining in the ED until their evaluation is complete.  Chest pain protocol started.   Emily Pester, FNP 11/14/23 1756

## 2023-11-15 ENCOUNTER — Emergency Department
Admission: EM | Admit: 2023-11-15 | Discharge: 2023-11-15 | Disposition: A | Discharge status: Home / Self Care | Payer: Medicare Other | Attending: Emergency Medicine | Admitting: Emergency Medicine

## 2023-11-15 ENCOUNTER — Ambulatory Visit: Payer: Self-pay | Admitting: *Deleted

## 2023-11-15 ENCOUNTER — Emergency Department: Payer: Medicare Other

## 2023-11-15 ENCOUNTER — Other Ambulatory Visit (HOSPITAL_COMMUNITY): Payer: Self-pay | Admitting: Cardiology

## 2023-11-15 DIAGNOSIS — J811 Chronic pulmonary edema: Secondary | ICD-10-CM | POA: Diagnosis not present

## 2023-11-15 DIAGNOSIS — R059 Cough, unspecified: Secondary | ICD-10-CM | POA: Diagnosis not present

## 2023-11-15 DIAGNOSIS — J810 Acute pulmonary edema: Secondary | ICD-10-CM

## 2023-11-15 DIAGNOSIS — I700 Atherosclerosis of aorta: Secondary | ICD-10-CM | POA: Diagnosis not present

## 2023-11-15 DIAGNOSIS — R0602 Shortness of breath: Secondary | ICD-10-CM | POA: Diagnosis not present

## 2023-11-15 LAB — TROPONIN I (HIGH SENSITIVITY): Troponin I (High Sensitivity): 18 ng/L — ABNORMAL HIGH (ref <18)

## 2023-11-15 LAB — RESP PANEL BY RT-PCR (RSV, FLU A&B, COVID)  RVPGX2
Influenza A by PCR: NEGATIVE
Influenza B by PCR: NEGATIVE
Resp Syncytial Virus by PCR: NEGATIVE
SARS Coronavirus 2 by RT PCR: NEGATIVE

## 2023-11-15 LAB — BRAIN NATRIURETIC PEPTIDE: B Natriuretic Peptide: 578.2 pg/mL — ABNORMAL HIGH (ref 0.0–100.0)

## 2023-11-15 MED ORDER — IOHEXOL 350 MG/ML SOLN
75.0000 mL | Freq: Once | INTRAVENOUS | Status: AC | PRN
  Administered 2023-11-15: 75 mL via INTRAVENOUS

## 2023-11-15 MED ORDER — FUROSEMIDE 10 MG/ML IJ SOLN
40.0000 mg | Freq: Once | INTRAMUSCULAR | Status: AC
  Administered 2023-11-15: 40 mg via INTRAVENOUS
  Filled 2023-11-15: qty 4

## 2023-11-15 MED ORDER — KETOROLAC TROMETHAMINE 30 MG/ML IJ SOLN
15.0000 mg | Freq: Once | INTRAMUSCULAR | Status: AC
  Administered 2023-11-15: 15 mg via INTRAVENOUS
  Filled 2023-11-15: qty 1

## 2023-11-15 MED ORDER — TORSEMIDE 20 MG PO TABS: ORAL_TABLET | ORAL | 6 refills | Status: DC

## 2023-11-15 NOTE — Telephone Encounter (Signed)
Reason for Disposition  [1] MODERATE longstanding difficulty breathing (e.g., speaks in phrases, SOB even at rest, pulse 100-120) AND [2] SAME as normal  Answer Assessment - Initial Assessment Questions 1. RESPIRATORY STATUS: "Describe your breathing?" (e.g., wheezing, shortness of breath, unable to speak, severe coughing)      Patient is better-breathing-  coughing up fluid- mucus 2. ONSET: "When did this breathing problem begin?"      Was seen at ED 1/31- started last week 3. PATTERN "Does the difficult breathing come and go, or has it been constant since it started?"      Better now- but cough 4. SEVERITY: "How bad is your breathing?" (e.g., mild, moderate, severe)    - MILD: No SOB at rest, mild SOB with walking, speaks normally in sentences, can lie down, no retractions, pulse < 100.    - MODERATE: SOB at rest, SOB with minimal exertion and prefers to sit, cannot lie down flat, speaks in phrases, mild retractions, audible wheezing, pulse 100-120.    - SEVERE: Very SOB at rest, speaks in single words, struggling to breathe, sitting hunched forward, retractions, pulse > 120      none 5. RECURRENT SYMPTOM: "Have you had difficulty breathing before?" If Yes, ask: "When was the last time?" and "What happened that time?"      Not this bad 6. CARDIAC HISTORY: "Do you have any history of heart disease?" (e.g., heart attack, angina, bypass surgery, angioplasty)      CHF 7. LUNG HISTORY: "Do you have any history of lung disease?"  (e.g., pulmonary embolus, asthma, emphysema)     CHF 8. CAUSE: "What do you think is causing the breathing problem?"      Fluid build up 9. OTHER SYMPTOMS: "Do you have any other symptoms? (e.g., dizziness, runny nose, cough, chest pain, fever)     Swelling in legs- still present- feet burn patient is able to walk now  Protocols used: Breathing Difficulty-A-AH

## 2023-11-15 NOTE — Discharge Instructions (Signed)
Your evaluation was generally reassuring.  It looks like you might be having a mild exacerbation of your CHF because you have some fluid on your lungs seen on CT scan.  We gave you an extra dose of diuretic (furosemide 40 mg IV) and we recommend you continue taking your regular medications including your torsemide.  Please follow-up with Clarisa Kindred at the heart failure center at the next available opportunity.  Return to the emergency department if you develop new or worsening symptoms that concern you.

## 2023-11-15 NOTE — Telephone Encounter (Signed)
  Chief Complaint: ED follow up call- requesting medication - lasix Symptoms: patient was treated at ED- CHF symptoms- SOB, leg swelling-pain Frequency: started over a period of 1 week- progressively worse Pertinent Negatives: Patient denies SOB now- but wants Rx for dieretic  Disposition: [] ED /[] Urgent Care (no appt availability in office) / [x] Appointment(In office/virtual)/ []  [facility]/ [] Home Care/ [] Refused Recommended Disposition /[]  Mobile Bus/ []  Follow-up with PCP Additional Notes: Patient is calling to request dieretic- she was seen at ED for CHF- and fluid build up causing her to have SOB. PCP has not prescribed Rx for this before and patient may need to be seen first- appointment has been scheduled. Patient advised to contact her cardiologist since they have prescribed this for her. Patient advised ED if she should get worse before her appointment Monday.

## 2023-11-15 NOTE — ED Notes (Signed)
CCMD called at this time and pt put on tele.

## 2023-11-15 NOTE — ED Provider Notes (Signed)
[facility] Provider Note    Event Date/Time   First MD Initiated Contact with Patient 11/15/23 0041     (approximate)   History   Cough and Shortness of Breath   HPI Emily Hinton is a 63 y.o. 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  She presents tonight for evaluation of cough, shortness of breath, and left-sided lower chest pain.  She said the symptoms have been present for a few days but the pain just started yesterday.  It occurs after coughing and with certain movements.  She said it is quite persistent.  She has had no fever of which she is aware.  She has no history of blood clots in the legs of the lungs.  She sometimes has swelling in both of her lower legs but not unilateral leg pain or swelling.  No recent trips.  She recently had a repeat echocardiogram but does not yet know the results.  No other chest pain other than the left-sided lower chest wall pain.     Physical Exam   Triage Vital Signs: ED Triage Vitals  Encounter Vitals Group     BP 11/14/23 1754 118/73     Systolic BP Percentile --      Diastolic BP Percentile --      Pulse Rate 11/14/23 1754 87     Resp 11/14/23 1754 16     Temp 11/14/23 1754 98 F (36.7 C)     Temp Source 11/15/23 0014 Oral     SpO2 11/14/23 1754 96 %     Weight 11/14/23 1754 118.8 kg (262 lb)     Height 11/14/23 1754 1.803 m (5\' 11" )     Head Circumference --      Peak Flow --      Pain Score 11/14/23 1754 7     Pain Loc --      Pain Education --      Exclude from Growth Chart --     Most recent vital signs: Vitals:   11/14/23 1754 11/15/23 0014  BP: 118/73 (!) 127/99  Pulse: 87 92  Resp: 16 19  Temp: 98 F (36.7 C) 98.7 F (37.1 C)  SpO2: 96% 96%    General: Awake, no obvious distress at this time. CV:  Good peripheral perfusion.  Regular rate and rhythm.  Normal heart sounds. Resp:  Normal effort. Speaking easily and  comfortably, no accessory muscle usage nor intercostal retractions.  Lung clear to auscultation with no wheezing or coarse breath sounds. Abd:  No distention.  Obese.  No tenderness to palpation of the abdomen.  Patient reports some "swelling" in the left upper quadrant or left lower part of her chest but I cannot appreciated on physical exam. Other:  Patient has some reproducible tenderness to palpation and manipulation of the left lower chest and chest wall.  Trace peripheral edema in bilateral lower extremities.   ED Results / Procedures / Treatments   Labs (all labs ordered are listed, but only abnormal results are displayed) Labs Reviewed  CBC WITH DIFFERENTIAL/PLATELET - Abnormal; Notable for the following components:      Result Value   RDW 16.0 (*)    All other components within normal limits  BASIC METABOLIC PANEL - Abnormal; Notable for the following components:   Glucose, Bld 102 (*)    Creatinine, Ser 1.02 (*)    All other components within normal limits  BRAIN NATRIURETIC PEPTIDE -  Abnormal; Notable for the following components:   B Natriuretic Peptide 578.2 (*)    All other components within normal limits  TROPONIN I (HIGH SENSITIVITY) - Abnormal; Notable for the following components:   Troponin I (High Sensitivity) 22 (*)    All other components within normal limits  TROPONIN I (HIGH SENSITIVITY) - Abnormal; Notable for the following components:   Troponin I (High Sensitivity) 18 (*)    All other components within normal limits  SARS CORONAVIRUS 2 BY RT PCR  RESP PANEL BY RT-PCR (RSV, FLU A&B, COVID)  [id number]     EKG  ED ECG REPORT I, Loleta Rose, the attending physician, personally viewed and interpreted this ECG.  Date: 11/14/2023 EKG Time: 17: 58 Rate: 88 Rhythm: normal sinus rhythm QRS Axis: normal Intervals: normal ST/T Wave abnormalities: normal Narrative Interpretation: no evidence of acute ischemia    RADIOLOGY I viewed and interpreted the  patient's two-view chest x-ray.  I see no evidence of pneumonia, pulmonary edema, nor pneumothorax.  See hospital course regarding details about CTA chest PE protocol   PROCEDURES:  Critical Care performed: No  Procedures    IMPRESSION / MDM / ASSESSMENT AND PLAN / ED COURSE  I reviewed the triage vital signs and the nursing notes.                              Differential diagnosis includes, but is not limited to, musculoskeletal strain, respiratory viral illness, community-acquired pneumonia, PE, ACS.  Patient's presentation is most consistent with acute presentation with potential threat to life or bodily function.  Labs/studies ordered: EKG, two-view chest x-ray, CTA chest, respiratory viral panel, CBC with differential, basic metabolic panel, high-sensitivity troponin x 2, BNP  Interventions/Medications given:  Medications  furosemide (LASIX) injection 40 mg (has no administration in time range)  ketorolac (TORADOL) 30 MG/ML injection 15 mg (15 mg Intravenous Given 11/15/23 0110)  iohexol (OMNIPAQUE) 350 MG/ML injection 75 mL (75 mLs Intravenous Contrast Given 11/15/23 0141)    (Note:  hospital course my include additional interventions and/or labs/studies not listed above.)   Vital signs are stable, no hypoxia, heart rate greater than 90 but not tachycardic.  Labs are all essentially normal other than a very slightly elevated high-sensitivity troponin of 22 which could be the result of her CHF.  Unlikely ACS and EKG is reassuring.  However, I am also concerned about the possibility of PE given the dyspnea and left-sided chest pain.  The patient is on the cardiac monitor to evaluate for evidence of arrhythmia and/or significant heart rate changes.   Clinical Course as of 11/15/23 0257  Caleen Essex Nov 15, 2023  0203 CT Angio Chest PE W/Cm &/Or Wo Cm I viewed and interpreted the patient's CTA chest.  No evidence of pulmonary embolism.  Mild pulmonary edema.  Confirmed by  radiology report. [CF]  0207 Troponin I (High Sensitivity)(!): 18 Stable and even downtrending high-sensitivity troponin which is reassuring [CF]  0207 B Natriuretic Peptide(!): 578.2 Mildly elevated BNP [CF]  0231 Resp panel by RT-PCR (RSV, Flu A&B, Covid) Anterior Nasal Swab Negative respiratory viral panel [CF]  0251 CT Angio Chest PE W/Cm &/Or Wo Cm Patient has been stable.  I talked with her about the results and the plan is for an extra dose of furosemide 40 mg IV given the mild pulmonary edema on CT and she will follow-up with Clarisa Kindred in the CHF clinic.  I recommend she  continue her regular medications and use over-the-counter pain medication as needed.  Given the complexity of her medical history, I considered hospitalization, but at this point I am reassured that her medical screening exam does not reveal any emergent medical conditions and she would be appropriate for outpatient follow-up.  She agrees with the plan and I gave my usual and customary return precautions. [CF]    Clinical Course User Index [CF] Loleta Rose, MD     FINAL CLINICAL IMPRESSION(S) / ED DIAGNOSES   Final diagnoses:  Shortness of breath  Acute pulmonary edema [facility])     Rx / DC Orders   ED Discharge Orders          Ordered    AMB referral to CHF clinic        11/15/23 0255             Note:  This document was prepared using Dragon voice recognition software and may include unintentional dictation errors.   Loleta Rose, MD 11/15/23 [phone]

## 2023-11-15 NOTE — Telephone Encounter (Signed)
Patient called with concerns of increased SOB Reports she was seen in the ER and given two doses of IV lasix but no RX for diuretic  Reports she has not taken torsemide x 1 mth -thought med was discontinued -reports severe SOB -good response to IV lasix (increase in UoP) Reports BLE  Weight 1/27 262  Above reviewed with Dr Shirlee Latch ok to restart torsemide 40 mg twice a day x 2-3 days, then resume normal dose 20 mg daily Keep fu 2/7 Pt aware and appreciative of return call

## 2023-11-18 ENCOUNTER — Encounter: Payer: Self-pay | Admitting: Nurse Practitioner

## 2023-11-18 ENCOUNTER — Ambulatory Visit (INDEPENDENT_AMBULATORY_CARE_PROVIDER_SITE_OTHER): Payer: Medicare Other | Admitting: Nurse Practitioner

## 2023-11-18 VITALS — BP 109/81 | HR 89 | Ht 71.0 in | Wt 261.2 lb

## 2023-11-18 DIAGNOSIS — F419 Anxiety disorder, unspecified: Secondary | ICD-10-CM

## 2023-11-18 DIAGNOSIS — I5020 Unspecified systolic (congestive) heart failure: Secondary | ICD-10-CM

## 2023-11-18 DIAGNOSIS — F339 Major depressive disorder, recurrent, unspecified: Secondary | ICD-10-CM

## 2023-11-18 DIAGNOSIS — I10 Essential (primary) hypertension: Secondary | ICD-10-CM

## 2023-11-18 DIAGNOSIS — Z794 Long term (current) use of insulin: Secondary | ICD-10-CM

## 2023-11-18 DIAGNOSIS — I5033 Acute on chronic diastolic (congestive) heart failure: Secondary | ICD-10-CM | POA: Diagnosis not present

## 2023-11-18 DIAGNOSIS — E1169 Type 2 diabetes mellitus with other specified complication: Secondary | ICD-10-CM | POA: Diagnosis not present

## 2023-11-18 MED ORDER — CITALOPRAM HYDROBROMIDE 10 MG PO TABS: 10.0000 mg | ORAL_TABLET | Freq: Every day | ORAL | 0 refills | Status: DC

## 2023-11-18 NOTE — Assessment & Plan Note (Signed)
Recommended eating smaller high protein, low fat meals more frequently and exercising 30 mins a day 5 times a week with a goal of 10-15lb weight loss in the next 3 months. She has an appt with a Nutritionist coming up.

## 2023-11-18 NOTE — Telephone Encounter (Signed)
FYI for appointment this afternoon.

## 2023-11-18 NOTE — Assessment & Plan Note (Signed)
Chronic.  Recent ER visit found to have Pulmonary Edema.  She is back on Toresemide 20mg  daily.  Continue with current regimen.  Labs ordered today.  Follow up in 1 month.  Encouraged patient to reschedule appt with Cardiology and HF Clinic.

## 2023-11-18 NOTE — Progress Notes (Signed)
BP 109/81 (BP Location: Left Arm, Patient Position: Sitting, Cuff Size: Large)   Pulse 89   Ht 5\' 11"  (1.803 m)   Wt 261 lb 3.2 oz (118.5 kg)   SpO2 97%   BMI 36.43 kg/m    Subjective:    Patient ID: Emily Hinton, female    DOB: 02-05-1961, 63 y.o.   MRN: [id number]  HPI: Emily Hinton is a 63 y.o. female  Chief Complaint  Patient presents with   Anxiety    Would like to be put on anxiety of medication   Hospitalization Follow-up   HYPERTENSION / HYPERLIPIDEMIA/HF Patient states she wasn't able to breath last week.  She was seen in the ER and found to have Pulmonary Edema.  She will see Clarisa Kindred on 11/22/23. Not weighing daily.  Torsemide was changed to daily.   Satisfied with current treatment? yes Duration of hypertension: years BP monitoring frequency: daily BP range: 120/70 BP medication side effects: no Past BP meds: carvedilol and valsartan Duration of hyperlipidemia: years Cholesterol medication side effects: no Cholesterol supplements: none Past cholesterol medications: rosuvastatin (crestor) Medication compliance: excellent compliance Aspirin: no Recent stressors: no Recurrent headaches: no Visual changes: no Palpitations: no Dyspnea: yes- but improving Chest pain: no Lower extremity edema: yes- but improving Dizzy/lightheaded: no  DIABETES Patient states she is taking Ozempic 0.5mg  and tolerating it well.  She does not tolerate higher doses of Ozempic.  Last A1c 6.4%. Hypoglycemic episodes:no Polydipsia/polyuria: no Visual disturbance: no Chest pain: no Paresthesias: no Glucose Monitoring: yes  Accucheck frequency: Daily  Fasting glucose: 130  Post prandial:  Evening:  Before meals: Taking Insulin?: yes  Long acting insulin: Lantus 30u  Short acting insulin: Humalog 6u TID- has been out of medication. Blood Pressure Monitoring: daily Retinal Examination: Up to Date Foot Exam:  up to date Diabetic Education:  Not Completed Pneumovax: Up to Date Influenza: Up to Date Aspirin: no  VP SHUNT Hasn't seen Neurology recently. Referral previously placed.  DEPRESSION/ANXIETY Patient states he mood has been worse lately.  She is reading to take something for her anxiety.  Acknowledged that it is worse now.  But her sons cause her a lot of stress.  She is no longer taking any medication.  She doesn't feel like she needs it.  She does feel stressed.     Relevant past medical, surgical, family and social history reviewed and updated as indicated. Interim medical history since our last visit reviewed. Allergies and medications reviewed and updated.  Review of Systems  HENT:  Nosebleeds: cmp.   Eyes:  Negative for visual disturbance.  Respiratory:  Negative for cough, chest tightness and shortness of breath.   Cardiovascular:  Negative for chest pain, palpitations and leg swelling.  Endocrine: Negative for polydipsia and polyuria.  Musculoskeletal:        Left leg pain  Neurological:  Negative for dizziness, numbness and headaches.  Psychiatric/Behavioral:  Positive for dysphoric mood. Negative for suicidal ideas. The patient is nervous/anxious.     Per HPI unless specifically indicated above     Objective:    BP 109/81 (BP Location: Left Arm, Patient Position: Sitting, Cuff Size: Large)   Pulse 89   Ht 5\' 11"  (1.803 m)   Wt 261 lb 3.2 oz (118.5 kg)   SpO2 97%   BMI 36.43 kg/m   Wt Readings from Last 3 Encounters:  11/18/23 261 lb 3.2 oz (118.5 kg)  11/14/23 262 lb (118.8 kg)  09/20/23 265 lb  12.8 oz (120.6 kg)    Physical Exam Vitals and nursing note reviewed.  Constitutional:      General: She is not in acute distress.    Appearance: Normal appearance. She is obese. She is not ill-appearing, toxic-appearing or diaphoretic.  HENT:     Head: Normocephalic.     Right Ear: External ear normal.     Left Ear: External ear normal.     Nose: Nose normal.     Mouth/Throat:     Mouth:  Mucous membranes are moist.     Pharynx: Oropharynx is clear.  Eyes:     General:        Right eye: No discharge.        Left eye: No discharge.     Extraocular Movements: Extraocular movements intact.     Conjunctiva/sclera: Conjunctivae normal.     Pupils: Pupils are equal, round, and reactive to light.  Cardiovascular:     Rate and Rhythm: Normal rate and regular rhythm.     Heart sounds: No murmur heard. Pulmonary:     Effort: Pulmonary effort is normal. No respiratory distress.     Breath sounds: Normal breath sounds. No wheezing or rales.  Musculoskeletal:     Cervical back: Normal range of motion and neck supple.     Right lower leg: Edema present.     Left lower leg: Edema present.  Skin:    General: Skin is warm and dry.     Capillary Refill: Capillary refill takes less than 2 seconds.  Neurological:     General: No focal deficit present.     Mental Status: She is alert and oriented to person, place, and time. Mental status is at baseline.  Psychiatric:        Mood and Affect: Mood normal.        Behavior: Behavior normal.        Thought Content: Thought content normal.        Judgment: Judgment normal.     Results for orders placed or performed during the hospital encounter of 11/15/23  SARS Coronavirus 2 by RT PCR (hospital order, performed in [facility] hospital lab) *cepheid single result test* Anterior Nasal Swab   Collection Time: 11/14/23  5:57 PM   Specimen: Anterior Nasal Swab  Result Value Ref Range   SARS Coronavirus 2 by RT PCR NEGATIVE NEGATIVE  CBC with Differential   Collection Time: 11/14/23  5:58 PM  Result Value Ref Range   WBC 6.7 4.0 - 10.5 K/uL   RBC 4.90 3.87 - 5.11 MIL/uL   Hemoglobin 13.8 12.0 - 15.0 g/dL   HCT 21.3 08.6 - 57.8 %   MCV 89.4 80.0 - 100.0 fL   MCH 28.2 26.0 - 34.0 pg   MCHC 31.5 30.0 - 36.0 g/dL   RDW 46.9 (H) 62.9 - 52.8 %   Platelets 308 150 - 400 K/uL   nRBC 0.0 0.0 - 0.2 %   Neutrophils Relative % 60 %   Neutro  Abs 3.9 1.7 - 7.7 K/uL   Lymphocytes Relative 29 %   Lymphs Abs 1.9 0.7 - 4.0 K/uL   Monocytes Relative 9 %   Monocytes Absolute 0.6 0.1 - 1.0 K/uL   Eosinophils Relative 2 %   Eosinophils Absolute 0.1 0.0 - 0.5 K/uL   Basophils Relative 0 %   Basophils Absolute 0.0 0.0 - 0.1 K/uL   Immature Granulocytes 0 %   Abs Immature Granulocytes 0.01 0.00 - 0.07 K/uL  Basic metabolic panel   Collection Time: 11/14/23  5:58 PM  Result Value Ref Range   Sodium 144 135 - 145 mmol/L   Potassium 5.0 3.5 - 5.1 mmol/L   Chloride 109 98 - 111 mmol/L   CO2 24 22 - 32 mmol/L   Glucose, Bld 102 (H) 70 - 99 mg/dL   BUN 11 8 - 23 mg/dL   Creatinine, Ser 4.09 (H) 0.44 - 1.00 mg/dL   Calcium 9.4 8.9 - 81.1 mg/dL   GFR, Estimated >91 >47 mL/min   Anion gap 11 5 - 15  Troponin I (High Sensitivity)   Collection Time: 11/14/23  5:58 PM  Result Value Ref Range   Troponin I (High Sensitivity) 22 (H) <18 ng/L  Brain natriuretic peptide   Collection Time: 11/15/23  1:03 AM  Result Value Ref Range   B Natriuretic Peptide 578.2 (H) 0.0 - 100.0 pg/mL  Troponin I (High Sensitivity)   Collection Time: 11/15/23  1:03 AM  Result Value Ref Range   Troponin I (High Sensitivity) 18 (H) <18 ng/L  Resp panel by RT-PCR (RSV, Flu A&B, Covid) Anterior Nasal Swab   Collection Time: 11/15/23  1:12 AM   Specimen: Anterior Nasal Swab  Result Value Ref Range   SARS Coronavirus 2 by RT PCR NEGATIVE NEGATIVE   Influenza A by PCR NEGATIVE NEGATIVE   Influenza B by PCR NEGATIVE NEGATIVE   Resp Syncytial Virus by PCR NEGATIVE NEGATIVE      Assessment & Plan:   Problem List Items Addressed This Visit       Cardiovascular and Mediastinum   Acute on chronic diastolic CHF (congestive heart failure) (HCC) - Primary   Chronic.  Recent ER visit found to have Pulmonary Edema.  She is back on Toresemide 20mg  daily.  Continue with current regimen.  Labs ordered today.  Follow up in 1 month.  Encouraged patient to reschedule appt  with Cardiology and HF Clinic.       Essential hypertension   Chronic.  Controlled.  Continue with current medication regimen of Carvedilol and Valsartan (Entresto).  Labs ordered today.  Return to clinic in 3 months for reevaluation.  Call sooner if concerns arise.       HFrEF (heart failure with reduced ejection fraction) (HCC)   Chronic.  Recent ER visit found to have Pulmonary Edema.  She is back on Toresemide 20mg  daily.  Continue with current regimen.  Labs ordered today.  Follow up in 1 month.  Encouraged patient to reschedule appt with Cardiology and HF Clinic.       Relevant Orders   CBC w/Diff     Endocrine   Diabetes mellitus (HCC)   Chronic.  Well controlled.  On Jardiance via PAP.  Patient on Lantus 30u daily.  Patient should stop Humalog.  Endorses sugars of 130s.  Recent A1c was 6.4%. Needs uptdated eye exam.  Labs ordered at visit today.  Follow up in 3 months.  Call sooner if concerns arise.       Relevant Orders   Comp Met (CMET)   HgB A1c     Other   Morbid obesity (HCC)   Recommended eating smaller high protein, low fat meals more frequently and exercising 30 mins a day 5 times a week with a goal of 10-15lb weight loss in the next 3 months. She has an appt with a Nutritionist coming up.      Relevant Orders   CBC w/Diff   Depression, recurrent (HCC)  Chronic. Not well controlled.  Patient agrees to start Celexa 10mg .  Side effects and benefits of medication discussed during visit.  Follow up in 1 month.  Call sooner if concerns arise.       Relevant Medications   citalopram (CELEXA) 10 MG tablet   Other Relevant Orders   CBC w/Diff   Anxiety   Chronic. Not well controlled.  Patient agrees to start Celexa 10mg .  Side effects and benefits of medication discussed during visit.  Follow up in 1 month.  Call sooner if concerns arise.       Relevant Medications   citalopram (CELEXA) 10 MG tablet       Follow up plan: Return in about 1 month (around  12/16/2023) for HTN, HLD, DM2 FU.

## 2023-11-18 NOTE — Assessment & Plan Note (Signed)
Chronic.  Well controlled.  On Jardiance via PAP.  Patient on Lantus 30u daily.  Patient should stop Humalog.  Endorses sugars of 130s.  Recent A1c was 6.4%. Needs uptdated eye exam.  Labs ordered at visit today.  Follow up in 3 months.  Call sooner if concerns arise.

## 2023-11-18 NOTE — Assessment & Plan Note (Signed)
Chronic. Not well controlled.  Patient agrees to start Celexa 10mg .  Side effects and benefits of medication discussed during visit.  Follow up in 1 month.  Call sooner if concerns arise.

## 2023-11-18 NOTE — Assessment & Plan Note (Signed)
Chronic.  Controlled.  Continue with current medication regimen of Carvedilol and Valsartan (Entresto).  Labs ordered today.  Return to clinic in 3 months for reevaluation.  Call sooner if concerns arise.   

## 2023-11-19 ENCOUNTER — Encounter: Payer: Self-pay | Admitting: Nurse Practitioner

## 2023-11-19 LAB — CBC WITH DIFFERENTIAL/PLATELET
Basophils Absolute: 0 10*3/uL (ref 0.0–0.2)
Basos: 0 %
EOS (ABSOLUTE): 0.2 10*3/uL (ref 0.0–0.4)
Eos: 3 %
Hematocrit: 47 % — ABNORMAL HIGH (ref 34.0–46.6)
Hemoglobin: 15.4 g/dL (ref 11.1–15.9)
Immature Grans (Abs): 0 10*3/uL (ref 0.0–0.1)
Immature Granulocytes: 0 %
Lymphocytes Absolute: 2.1 10*3/uL (ref 0.7–3.1)
Lymphs: 37 %
MCH: 28.5 pg (ref 26.6–33.0)
MCHC: 32.8 g/dL (ref 31.5–35.7)
MCV: 87 fL (ref 79–97)
Monocytes Absolute: 0.8 10*3/uL (ref 0.1–0.9)
Monocytes: 14 %
Neutrophils Absolute: 2.7 10*3/uL (ref 1.4–7.0)
Neutrophils: 46 %
Platelets: 360 10*3/uL (ref 150–450)
RBC: 5.41 x10E6/uL — ABNORMAL HIGH (ref 3.77–5.28)
RDW: 14.1 % (ref 11.7–15.4)
WBC: 5.7 10*3/uL (ref 3.4–10.8)

## 2023-11-19 LAB — COMPREHENSIVE METABOLIC PANEL
ALT: 28 [IU]/L (ref 0–32)
AST: 18 [IU]/L (ref 0–40)
Albumin: 4.2 g/dL (ref 3.9–4.9)
Alkaline Phosphatase: 94 [IU]/L (ref 44–121)
BUN/Creatinine Ratio: 14 (ref 12–28)
BUN: 14 mg/dL (ref 8–27)
Bilirubin Total: 0.4 mg/dL (ref 0.0–1.2)
CO2: 25 mmol/L (ref 20–29)
Calcium: 9.5 mg/dL (ref 8.7–10.3)
Chloride: 100 mmol/L (ref 96–106)
Creatinine, Ser: 1 mg/dL (ref 0.57–1.00)
Globulin, Total: 3.1 g/dL (ref 1.5–4.5)
Glucose: 95 mg/dL (ref 70–99)
Potassium: 4.2 mmol/L (ref 3.5–5.2)
Sodium: 142 mmol/L (ref 134–144)
Total Protein: 7.3 g/dL (ref 6.0–8.5)
eGFR: 64 mL/min/{1.73_m2} (ref >59)

## 2023-11-19 LAB — HEMOGLOBIN A1C
Est. average glucose Bld gHb Est-mCnc: 140 mg/dL
Hgb A1c MFr Bld: 6.5 % — ABNORMAL HIGH (ref 4.8–5.6)

## 2023-11-22 ENCOUNTER — Encounter: Payer: Medicare Other | Admitting: Cardiology

## 2023-11-22 ENCOUNTER — Ambulatory Visit: Payer: Self-pay | Admitting: Nurse Practitioner

## 2023-11-27 ENCOUNTER — Telehealth: Payer: Self-pay | Admitting: Cardiology

## 2023-11-27 NOTE — Telephone Encounter (Signed)
Pt had appt sched for 11/22/23, called and cancelled said will cb to r/s

## 2023-11-29 ENCOUNTER — Other Ambulatory Visit (HOSPITAL_COMMUNITY): Payer: Self-pay

## 2023-11-29 ENCOUNTER — Telehealth: Payer: Self-pay

## 2023-11-29 NOTE — Telephone Encounter (Signed)
Patient Advocate Encounter   The patient was approved for a [employer] grant that will help cover the cost of ENTRESTO Total amount awarded, $10,000.  Effective: 10/30/23 - 10/28/24   [id number] [id number]   Pharmacy provided with approval and processing information. Patient informed via Dorcas Carrow, CPhT  Pharmacy Patient Advocate Specialist  Direct Number: [phone] Fax: [phone]

## 2023-12-02 ENCOUNTER — Other Ambulatory Visit: Payer: Self-pay | Admitting: Nurse Practitioner

## 2023-12-02 ENCOUNTER — Other Ambulatory Visit: Payer: Self-pay | Admitting: Physician Assistant

## 2023-12-02 DIAGNOSIS — E781 Pure hyperglyceridemia: Secondary | ICD-10-CM

## 2023-12-03 NOTE — Telephone Encounter (Signed)
Requested Prescriptions  Pending Prescriptions Disp Refills   rosuvastatin (CRESTOR) 10 MG tablet [Pharmacy Med Name: ROSUVASTATIN 10MG  TABLETS] 90 tablet 1    Sig: TAKE 1 TABLET(10 MG) BY MOUTH DAILY     Cardiovascular:  Antilipid - Statins 2 Failed - 12/03/2023  1:06 PM      Failed - Lipid Panel in normal range within the last 12 months    Cholesterol, Total  Date Value Ref Range Status  08/16/2023 108 100 - 199 mg/dL Final   LDL Chol Calc (NIH)  Date Value Ref Range Status  08/16/2023 42 0 - 99 mg/dL Final   HDL  Date Value Ref Range Status  08/16/2023 48 >39 mg/dL Final   Triglycerides  Date Value Ref Range Status  08/16/2023 97 0 - 149 mg/dL Final         Passed - Cr in normal range and within 360 days    Creatinine, Ser  Date Value Ref Range Status  11/18/2023 1.00 0.57 - 1.00 mg/dL Final         Passed - Patient is not pregnant      Passed - Valid encounter within last 12 months    Recent Outpatient Visits           3 months ago Type 2 diabetes mellitus with other specified complication, with long-term current use of insulin (HCC)   [facility] Larae Grooms, NP   6 months ago Liver lesion, right lobe   [place] [facility] Mecum, Oswaldo Conroy, PA-C   7 months ago Failure to attend appointment   [place] [facility] Mecum, Oswaldo Conroy, PA-C   9 months ago HFrEF (heart failure with reduced ejection fraction) [facility])   [place] [facility] Larae Grooms, NP   11 months ago Type 2 diabetes mellitus with other specified complication, with long-term current use of insulin [facility])   [place] [facility] Larae Grooms, NP       Future Appointments             In 3 weeks Larae Grooms, NP [facility]

## 2023-12-03 NOTE — Telephone Encounter (Signed)
Requested Prescriptions  Pending Prescriptions Disp Refills   omeprazole (PRILOSEC) 20 MG capsule [Pharmacy Med Name: OMEPRAZOLE 20MG  CAPSULES] 90 capsule 0    Sig: TAKE 1 CAPSULE(20 MG) BY MOUTH DAILY     Gastroenterology: Proton Pump Inhibitors Passed - 12/03/2023  1:04 PM      Passed - Valid encounter within last 12 months    Recent Outpatient Visits           3 months ago Type 2 diabetes mellitus with other specified complication, with long-term current use of insulin (HCC)   [place] [facility] Larae Grooms, NP   6 months ago Liver lesion, right lobe   [place] [facility] Mecum, Oswaldo Conroy, PA-C   7 months ago Failure to attend appointment   [place] [facility] Mecum, Oswaldo Conroy, PA-C   9 months ago HFrEF (heart failure with reduced ejection fraction) [facility])   [place] [facility] Larae Grooms, NP   11 months ago Type 2 diabetes mellitus with other specified complication, with long-term current use of insulin [facility])   [place] [facility] Larae Grooms, NP       Future Appointments             In 3 weeks Larae Grooms, NP [place] [facility]

## 2023-12-06 ENCOUNTER — Ambulatory Visit: Payer: Medicare Other | Admitting: Dietician

## 2023-12-19 ENCOUNTER — Ambulatory Visit: Payer: Medicare Other | Admitting: Nurse Practitioner

## 2023-12-19 ENCOUNTER — Telehealth: Payer: Self-pay

## 2023-12-19 NOTE — Telephone Encounter (Signed)
 Call pt to let her know her PAP is coming up due on [employer] (Ozempic)due on 01/17/24 left a HIPAA VM. Will follow up

## 2023-12-23 ENCOUNTER — Other Ambulatory Visit: Payer: Self-pay | Admitting: Nurse Practitioner

## 2023-12-25 NOTE — Telephone Encounter (Signed)
 Following up on PAP Novo Nordisk ,gave pt a call pt did not answer and could not leave a msg. Will try again in a few days.

## 2023-12-26 NOTE — Progress Notes (Unsigned)
 There were no vitals taken for this visit.   Subjective:    Patient ID: Emily Hinton, female    DOB: [birth year], 63 y.o.   MRN: [id number]  HPI: Emily Hinton is a 63 y.o. female  No chief complaint on file.  HYPERTENSION / HYPERLIPIDEMIA/HF Patient states she wasn't able to breath last week.  She was seen in the ER and found to have Pulmonary Edema.  She will see Clarisa Kindred on 11/22/23. Not weighing daily.  Torsemide was changed to daily.   Satisfied with current treatment? yes Duration of hypertension: years BP monitoring frequency: daily BP range: 120/70 BP medication side effects: no Past BP meds: carvedilol and valsartan Duration of hyperlipidemia: years Cholesterol medication side effects: no Cholesterol supplements: none Past cholesterol medications: rosuvastatin (crestor) Medication compliance: excellent compliance Aspirin: no Recent stressors: no Recurrent headaches: no Visual changes: no Palpitations: no Dyspnea: yes- but improving Chest pain: no Lower extremity edema: yes- but improving Dizzy/lightheaded: no  DIABETES Patient states she is taking Ozempic 0.5mg  and tolerating it well.  She does not tolerate higher doses of Ozempic.  Last A1c 6.4%. Hypoglycemic episodes:no Polydipsia/polyuria: no Visual disturbance: no Chest pain: no Paresthesias: no Glucose Monitoring: yes  Accucheck frequency: Daily  Fasting glucose: 130  Post prandial:  Evening:  Before meals: Taking Insulin?: yes  Long acting insulin: Lantus 30u  Short acting insulin: Humalog 6u TID- has been out of medication. Blood Pressure Monitoring: daily Retinal Examination: Up to Date Foot Exam:  up to date Diabetic Education: Not Completed Pneumovax: Up to Date Influenza: Up to Date Aspirin: no  VP SHUNT Hasn't seen Neurology recently. Referral previously placed.  DEPRESSION/ANXIETY Patient states he mood has been worse lately.  She is reading to  take something for her anxiety.  Acknowledged that it is worse now.  But her sons cause her a lot of stress.  She is no longer taking any medication.  She doesn't feel like she needs it.  She does feel stressed.     Relevant past medical, surgical, family and social history reviewed and updated as indicated. Interim medical history since our last visit reviewed. Allergies and medications reviewed and updated.  Review of Systems  HENT:  Nosebleeds: cmp.   Eyes:  Negative for visual disturbance.  Respiratory:  Negative for cough, chest tightness and shortness of breath.   Cardiovascular:  Negative for chest pain, palpitations and leg swelling.  Endocrine: Negative for polydipsia and polyuria.  Musculoskeletal:        Left leg pain  Neurological:  Negative for dizziness, numbness and headaches.  Psychiatric/Behavioral:  Positive for dysphoric mood. Negative for suicidal ideas. The patient is nervous/anxious.     Per HPI unless specifically indicated above     Objective:    There were no vitals taken for this visit.  Wt Readings from Last 3 Encounters:  11/18/23 261 lb 3.2 oz (118.5 kg)  11/14/23 262 lb (118.8 kg)  09/20/23 265 lb 12.8 oz (120.6 kg)    Physical Exam Vitals and nursing note reviewed.  Constitutional:      General: She is not in acute distress.    Appearance: Normal appearance. She is obese. She is not ill-appearing, toxic-appearing or diaphoretic.  HENT:     Head: Normocephalic.     Right Ear: External ear normal.     Left Ear: External ear normal.     Nose: Nose normal.     Mouth/Throat:     Mouth: Mucous membranes  are moist.     Pharynx: Oropharynx is clear.  Eyes:     General:        Right eye: No discharge.        Left eye: No discharge.     Extraocular Movements: Extraocular movements intact.     Conjunctiva/sclera: Conjunctivae normal.     Pupils: Pupils are equal, round, and reactive to light.  Cardiovascular:     Rate and Rhythm: Normal rate and  regular rhythm.     Heart sounds: No murmur heard. Pulmonary:     Effort: Pulmonary effort is normal. No respiratory distress.     Breath sounds: Normal breath sounds. No wheezing or rales.  Musculoskeletal:     Cervical back: Normal range of motion and neck supple.     Right lower leg: Edema present.     Left lower leg: Edema present.  Skin:    General: Skin is warm and dry.     Capillary Refill: Capillary refill takes less than 2 seconds.  Neurological:     General: No focal deficit present.     Mental Status: She is alert and oriented to person, place, and time. Mental status is at baseline.  Psychiatric:        Mood and Affect: Mood normal.        Behavior: Behavior normal.        Thought Content: Thought content normal.        Judgment: Judgment normal.    Results for orders placed or performed in visit on 11/18/23  Comp Met (CMET)   Collection Time: 11/18/23  2:55 PM  Result Value Ref Range   Glucose 95 70 - 99 mg/dL   BUN 14 8 - 27 mg/dL   Creatinine, Ser 1.61 0.57 - 1.00 mg/dL   eGFR 64 >09 UE/AVW/0.98   BUN/Creatinine Ratio 14 12 - 28   Sodium 142 134 - 144 mmol/L   Potassium 4.2 3.5 - 5.2 mmol/L   Chloride 100 96 - 106 mmol/L   CO2 25 20 - 29 mmol/L   Calcium 9.5 8.7 - 10.3 mg/dL   Total Protein 7.3 6.0 - 8.5 g/dL   Albumin 4.2 3.9 - 4.9 g/dL   Globulin, Total 3.1 1.5 - 4.5 g/dL   Bilirubin Total 0.4 0.0 - 1.2 mg/dL   Alkaline Phosphatase 94 44 - 121 IU/L   AST 18 0 - 40 IU/L   ALT 28 0 - 32 IU/L  HgB A1c   Collection Time: 11/18/23  2:55 PM  Result Value Ref Range   Hgb A1c MFr Bld 6.5 (H) 4.8 - 5.6 %   Est. average glucose Bld gHb Est-mCnc 140 mg/dL  CBC w/Diff   Collection Time: 11/18/23  2:55 PM  Result Value Ref Range   WBC 5.7 3.4 - 10.8 x10E3/uL   RBC 5.41 (H) 3.77 - 5.28 x10E6/uL   Hemoglobin 15.4 11.1 - 15.9 g/dL   Hematocrit 11.9 (H) 14.7 - 46.6 %   MCV 87 79 - 97 fL   MCH 28.5 26.6 - 33.0 pg   MCHC 32.8 31.5 - 35.7 g/dL   RDW 82.9 56.2 -  13.0 %   Platelets 360 150 - 450 x10E3/uL   Neutrophils 46 Not Estab. %   Lymphs 37 Not Estab. %   Monocytes 14 Not Estab. %   Eos 3 Not Estab. %   Basos 0 Not Estab. %   Neutrophils Absolute 2.7 1.4 - 7.0 x10E3/uL   Lymphocytes Absolute 2.1  0.7 - 3.1 x10E3/uL   Monocytes Absolute 0.8 0.1 - 0.9 x10E3/uL   EOS (ABSOLUTE) 0.2 0.0 - 0.4 x10E3/uL   Basophils Absolute 0.0 0.0 - 0.2 x10E3/uL   Immature Granulocytes 0 Not Estab. %   Immature Grans (Abs) 0.0 0.0 - 0.1 x10E3/uL      Assessment & Plan:   Problem List Items Addressed This Visit   None       Follow up plan: No follow-ups on file.

## 2023-12-27 ENCOUNTER — Encounter: Payer: Self-pay | Admitting: Nurse Practitioner

## 2023-12-27 ENCOUNTER — Ambulatory Visit (INDEPENDENT_AMBULATORY_CARE_PROVIDER_SITE_OTHER): Payer: Medicare Other | Admitting: Nurse Practitioner

## 2023-12-27 VITALS — BP 102/68 | HR 69 | Temp 98.1°F | Ht 71.0 in | Wt 268.4 lb

## 2023-12-27 DIAGNOSIS — Z794 Long term (current) use of insulin: Secondary | ICD-10-CM | POA: Diagnosis not present

## 2023-12-27 DIAGNOSIS — E1169 Type 2 diabetes mellitus with other specified complication: Secondary | ICD-10-CM | POA: Diagnosis not present

## 2023-12-27 DIAGNOSIS — F339 Major depressive disorder, recurrent, unspecified: Secondary | ICD-10-CM | POA: Diagnosis not present

## 2023-12-27 DIAGNOSIS — I5020 Unspecified systolic (congestive) heart failure: Secondary | ICD-10-CM

## 2023-12-27 MED ORDER — CITALOPRAM HYDROBROMIDE 20 MG PO TABS: 20.0000 mg | ORAL_TABLET | Freq: Every day | ORAL | 1 refills | Status: AC

## 2023-12-27 NOTE — Assessment & Plan Note (Signed)
 Chronic.  Ongoing.  Pharmacy team was not able to get a hold of patient for PAP.  Recommend patient answer call when it comes in.  Will reach out to CCM to have them call her again. Follow up in 3 months.  Call sooner if concerns arise.

## 2023-12-27 NOTE — Assessment & Plan Note (Addendum)
 Chronic.  Not well controlled.  Has not followed up with Cardiology.  Does not weigh daily.  Recommend patient make an appointment and attend the appointment with cardiology.  Appears Euvolemic in office.  Per patient breathing has improved.  She is back on Torsemide. Follow up in 3 months.  Call sooner if concerns arise. Will help patient make appointment with Cardiology.   - Reminded to call for an overnight weight gain of >2 pounds or a weekly weight gain of >5 pounds - not adding salt to food and read food labels. Reviewed the importance of keeping daily sodium intake to 2000mg  daily. - Avoid Ibuprofen products.

## 2023-12-27 NOTE — Assessment & Plan Note (Signed)
 Chronic.  Ongoing concern.  Improved with Celexa 10mg .  Will increase to 20mg  daily.  Follow up in 3 months.  Call sooner if concerns arise.

## 2023-12-28 LAB — COMPREHENSIVE METABOLIC PANEL
ALT: 29 IU/L (ref 0–32)
AST: 24 IU/L (ref 0–40)
Albumin: 4.4 g/dL (ref 3.9–4.9)
Alkaline Phosphatase: 88 IU/L (ref 44–121)
BUN/Creatinine Ratio: 13 (ref 12–28)
BUN: 13 mg/dL (ref 8–27)
Bilirubin Total: 0.5 mg/dL (ref 0.0–1.2)
CO2: 22 mmol/L (ref 20–29)
Calcium: 9.4 mg/dL (ref 8.7–10.3)
Chloride: 100 mmol/L (ref 96–106)
Creatinine, Ser: 1.04 mg/dL — ABNORMAL HIGH (ref 0.57–1.00)
Globulin, Total: 3 g/dL (ref 1.5–4.5)
Glucose: 186 mg/dL — ABNORMAL HIGH (ref 70–99)
Potassium: 4.6 mmol/L (ref 3.5–5.2)
Sodium: 139 mmol/L (ref 134–144)
Total Protein: 7.4 g/dL (ref 6.0–8.5)
eGFR: 61 mL/min/{1.73_m2} (ref >59)

## 2023-12-30 ENCOUNTER — Encounter: Payer: Self-pay | Admitting: Nurse Practitioner

## 2023-12-30 NOTE — Telephone Encounter (Signed)
 Following up on pt PAP Novo Nordisk,pt gave consent to fill out application on line and will fax provider portion today.

## 2024-01-09 ENCOUNTER — Telehealth: Payer: Self-pay | Admitting: Cardiology

## 2024-01-09 NOTE — Telephone Encounter (Signed)
 Called to sched recall

## 2024-01-10 ENCOUNTER — Encounter: Payer: Self-pay | Admitting: Dietician

## 2024-01-10 ENCOUNTER — Encounter: Payer: Medicare Other | Attending: Physician Assistant | Admitting: Dietician

## 2024-01-10 DIAGNOSIS — Z794 Long term (current) use of insulin: Secondary | ICD-10-CM | POA: Diagnosis not present

## 2024-01-10 DIAGNOSIS — E1169 Type 2 diabetes mellitus with other specified complication: Secondary | ICD-10-CM | POA: Diagnosis not present

## 2024-01-10 DIAGNOSIS — Z713 Dietary counseling and surveillance: Secondary | ICD-10-CM | POA: Diagnosis not present

## 2024-01-10 NOTE — Progress Notes (Signed)
 Diabetes Self-Management Education  Visit Type: First/Initial  Appt. Start Time: 1030 Appt. End Time: 1140  01/10/2024  [name], identified by name and date of birth, is a 63 y.o. female with a diagnosis of Diabetes: Type 2.   ASSESSMENT Pt reports taking Lantus @30u , Jardiance, Ozempic, and Metformin for their DM, reports no GI side effects or hypoglycemia, checking FBG daily. Pt reports a goal of forming a better dietary plan, states that they like to cook, has reduced salt and sugar in their food. Pt reports not eating a lot but still gaining weight, states their weight fluctuates but overall is increasing. Pt reports their weight affects their breathing. Pt reports making juices with fruit, vegetables and ginger, and tries to make them with a variety of colors of fruits/vegetables, will have in 4 oz bottles. 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 Pt reports significant DOE. Pt reports CHF, taking Torsemide daily. 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 Pt reports traveling back and forth to [place] to care for their mother every month as well as caring for their father in [place], reports a low stress level.    Diabetes Self-Management Education - 01/10/24 1051       Visit Information   Visit Type First/Initial      Initial Visit   Diabetes Type Type 2    Date Diagnosed 2023    Are you currently following a meal plan? No    Are you taking your medications as prescribed? Yes      Health Coping   How would you rate your overall health? Fair      Psychosocial Assessment   Patient Belief/Attitude about Diabetes Afraid    Self-care barriers None    Self-management support Doctor's office;Family    Other persons present  Patient    Patient Concerns Nutrition/Meal planning;Weight Control    Special Needs None    Preferred Learning Style No preference indicated    Learning Readiness Ready    How often do you need to have someone help you when you read instructions, pamphlets, or other written materials from your doctor or pharmacy? 1 - Never    What is the last grade level you completed in school? College      Pre-Education Assessment   Patient understands the diabetes disease and treatment process. Needs Instruction    Patient understands incorporating nutritional management into lifestyle. Needs Instruction    Patient undertands incorporating physical activity into lifestyle. Needs Instruction    Patient understands using medications safely. Needs Instruction    Patient understands monitoring blood glucose, interpreting and using results Needs Instruction    Patient understands prevention, detection, and treatment of acute complications. Needs Instruction    Patient understands prevention, detection, and treatment of chronic complications. Needs Instruction    Patient understands how to develop strategies to address psychosocial issues. Needs Instruction    Patient understands how to develop strategies to promote health/change behavior. Needs Instruction      Complications   Last HgB A1C per patient/outside source 6.5 %   11/18/2023   How often do you check your blood sugar? 1-2 times/day    Fasting Blood glucose range (mg/dL) [phone]    Have you had a dilated eye  exam in the past 12 months? No    Have you had a dental exam in the past 12 months? Yes    Are you checking your feet? No   Occasional soreness     Dietary Intake   Breakfast Fruit juice, Raisin Bran cereal, whole milk    Snack (morning) Banana    Lunch Beef Patty    Dinner Raisin Bran cereal, whole milk    Beverage(s) Juice, Lemon water      Activity / Exercise   Activity / Exercise Type ADL's;Light (walking / raking leaves)    How many  days per week do you exercise? 7    How many minutes per day do you exercise? 15    Total minutes per week of exercise 105      Patient Education   Previous Diabetes Education No    Disease Pathophysiology Explored patient's options for treatment of their diabetes    Healthy Eating Role of diet in the treatment of diabetes and the relationship between the three main macronutrients and blood glucose level;Meal options for control of blood glucose level and chronic complications.    Being Active Helped patient identify appropriate exercises in relation to his/her diabetes, diabetes complications and other health issue.   DOE, CHF   Medications Reviewed patients medication for diabetes, action, purpose, timing of dose and side effects.    Monitoring Identified appropriate SMBG and/or A1C goals.    Chronic complications Lipid levels, blood glucose control and heart disease;Relationship between chronic complications and blood glucose control    Diabetes Stress and Support Identified and addressed patients feelings and concerns about diabetes;Brainstormed with patient on coping mechanisms for social situations, getting support from significant others, dealing with feelings about diabetes      Individualized Goals (developed by patient)   Nutrition General guidelines for healthy choices and portions discussed    Physical Activity 15 minutes per day   Low exertion activity   Medications take my medication as prescribed    Monitoring  Test my blood glucose as discussed    Problem Solving Eating Pattern      Post-Education Assessment   Patient understands the diabetes disease and treatment process. Comprehends key points    Patient understands incorporating nutritional management into lifestyle. Comprehends key points    Patient undertands incorporating physical activity into lifestyle. Comprehends key points    Patient understands using medications safely. Comphrehends key points    Patient  understands monitoring blood glucose, interpreting and using results Comprehends key points    Patient understands prevention, detection, and treatment of acute complications. Comprehends key points    Patient understands prevention, detection, and treatment of chronic complications. Comprehends key points    Patient understands how to develop strategies to address psychosocial issues. Comprehends key points    Patient understands how to develop strategies to promote health/change behavior. Comprehends key points      Outcomes   Expected Outcomes Demonstrated interest in learning. Expect positive outcomes    Future DMSE PRN    Program Status Completed             Individualized Plan for Diabetes Self-Management Training:   Learning Objective:  Patient will have a greater understanding of diabetes self-management. Patient education plan is to attend individual and/or group sessions per assessed needs and concerns.   Plan:   Patient Instructions  Switch from whole milk to a 1% milk.  Work on being consistent with your meals during the days of the  week. Try to eat vegetables each day with at least one meal.  When having cereal, only have 3/4 cup - 1 cup per meal!  Expected Outcomes:  Demonstrated interest in learning. Expect positive outcomes  Education material provided: Fruits List, Non-Starchy Vegetable list  If problems or questions, patient to contact team via:  Phone and Email  Future DSME appointment: PRN

## 2024-01-10 NOTE — Patient Instructions (Addendum)
 Switch from whole milk to a 1% milk.  Work on being consistent with your meals during the days of the week. Try to eat vegetables each day with at least one meal.  When having cereal, only have 3/4 cup - 1 cup per meal!

## 2024-02-06 ENCOUNTER — Ambulatory Visit: Payer: Self-pay

## 2024-02-06 VITALS — Ht 71.0 in | Wt 267.0 lb

## 2024-02-06 DIAGNOSIS — Z1231 Encounter for screening mammogram for malignant neoplasm of breast: Secondary | ICD-10-CM

## 2024-02-06 DIAGNOSIS — Z0000 Encounter for general adult medical examination without abnormal findings: Secondary | ICD-10-CM | POA: Diagnosis not present

## 2024-02-06 NOTE — Patient Instructions (Addendum)
 [name]- Emily Hinton , Thank you for taking time to come for your Medicare Wellness Visit. I appreciate your ongoing commitment to your health goals. Please review the following plan we discussed and let me know if I can assist you in the future.   Referrals/Orders/Follow-Ups/Clinician Recommendations:   Recommend getting a diabetic eye exam every year. I have included a list of eye doctors in the area.  I have placed an order for a mammogram. Your last mammogram was at [facility]. It is best to continue to have mammograms at the same place so that they can compare to previous studies. Call [facility] @ [phone] to schedule at your earliest convenience. You are due for a diabetic foot exam and this can be done at your next OV on 04/03/24. Get a tetanus shot at your local pharmacy. You will be due for a colonoscopy 07/2024. Flu and covid booster due in the fall.  This is a list of the screening recommended for you and due dates:  Health Maintenance  Topic Date Due   Zoster (Shingles) Vaccine (1 of 2) Never done   Pap with HPV screening  11/26/2021   COVID-19 Vaccine (4 - 2024-25 season) 06/16/2023   Eye exam for diabetics  08/07/2023   Complete foot exam   08/15/2023   Mammogram  11/09/2023   DTaP/Tdap/Td vaccine (2 - Td or Tdap) 11/10/2023   Yearly kidney health urinalysis for diabetes  12/08/2023   Flu Shot  05/15/2024   Hemoglobin A1C  05/17/2024   Colon Cancer Screening  08/09/2024   Yearly kidney function blood test for diabetes  12/26/2024   Medicare Annual Wellness Visit  02/05/2025   Pneumococcal Vaccination  Completed   Hepatitis C Screening  Completed   HIV Screening  Completed   HPV Vaccine  Aged Out   Meningitis B Vaccine  Aged Out    Advanced directives: (Copy Requested) Please bring a copy of your health care power of attorney and living will to the office to be added to your chart at your convenience. You can mail to [facility]  [address] [place], [place] [address] or email to [email or web address]  Next Medicare Annual Wellness Visit scheduled for next year: Yes, 02/18/25 @ 10:40am (phone visit)  There are several Eye Doctors in your area. Here are a few that usually accept all insurance types:  [facility] [address] [place], [place] [address] Phone: [phone]  [facility] [address] [place], [place] [address] Phone: [phone]  [facility] [address] [place], [place] [address] Phone: [phone]  [facility] [address] [place], [place] [address] Phone: [phone]  [facility] [address] [place], [place] [address] Phone: [phone]  [facility] [address] [place], [place] [address] Phone: [phone]  Please let us  know if you require a referral for an eye exam appointment. Thank you!   Fall Prevention in the Home, Adult Falls can cause injuries and affect people of all ages. There are many simple things that you can do to make your home safe and to help prevent falls. If you need it, ask for help making these changes. What actions can I take to prevent falls? General information Use good lighting in all rooms. Make sure to: Replace any light bulbs that burn out. Turn on lights if it is dark and use night-lights. Keep items that you use often in easy-to-reach places. Lower the  shelves around your home if needed. Move furniture so that there are clear paths around it. Do not keep throw rugs or other things on the floor that can make you trip. If any of your floors are uneven, fix them. Add color or contrast paint or tape to clearly mark and help you see: Grab bars or handrails. First and last steps of staircases. Where the edge of each step is. If you use a ladder or stepladder: Make sure that it is fully opened. Do not climb a closed ladder. Make sure the sides of the ladder are locked in place. Have someone hold the  ladder while you use it. Know where your pets are as you move through your home. What can I do in the bathroom?     Keep the floor dry. Clean up any water that is on the floor right away. Remove soap buildup in the bathtub or shower. Buildup makes bathtubs and showers slippery. Use non-skid mats or decals on the floor of the bathtub or shower. Attach bath mats securely with double-sided, non-slip rug tape. If you need to sit down while you are in the shower, use a non-slip stool. Install grab bars by the toilet and in the bathtub and shower. Do not use towel bars as grab bars. What can I do in the bedroom? Make sure that you have a light by your bed that is easy to reach. Do not use any sheets or blankets on your bed that hang to the floor. Have a firm bench or chair with side arms that you can use for support when you get dressed. What can I do in the kitchen? Clean up any spills right away. If you need to reach something above you, use a sturdy step stool that has a grab bar. Keep electrical cables out of the way. Do not use floor polish or wax that makes floors slippery. What can I do with my stairs? Do not leave anything on the stairs. Make sure that you have a light switch at the top and the bottom of the stairs. Have them installed if you do not have them. Make sure that there are handrails on both sides of the stairs. Fix handrails that are broken or loose. Make sure that handrails are as long as the staircases. Install non-slip stair treads on all stairs in your home if they do not have carpet. Avoid having throw rugs at the top or bottom of stairs, or secure the rugs with carpet tape to prevent them from moving. Choose a carpet design that does not hide the edge of steps on the stairs. Make sure that carpet is firmly attached to the stairs. Fix any carpet that is loose or worn. What can I do on the outside of my home? Use bright outdoor lighting. Repair the edges of walkways  and driveways and fix any cracks. Clear paths of anything that can make you trip, such as tools or rocks. Add color or contrast paint or tape to clearly mark and help you see high doorway thresholds. Trim any bushes or trees on the main path into your home. Check that handrails are securely fastened and in good repair. Both sides of all steps should have handrails. Install guardrails along the edges of any raised decks or porches. Have leaves, snow, and ice cleared regularly. Use sand, salt, or ice melt on walkways during winter months if you live where there is ice and snow. In the garage, clean up any spills  right away, including grease or oil spills. What other actions can I take? Review your medicines with your health care provider. Some medicines can make you confused or feel dizzy. This can increase your chance of falling. Wear closed-toe shoes that fit well and support your feet. Wear shoes that have rubber soles and low heels. Use a cane, walker, scooter, or crutches that help you move around if needed. Talk with your provider about other ways that you can decrease your risk of falls. This may include seeing a physical therapist to learn to do exercises to improve movement and strength. Where to find more information Centers for Disease Control and Prevention, STEADI: TonerPromos.no [employer] on Aging: BaseRingTones.pl National Institute on Aging: BaseRingTones.pl Contact a health care provider if: You are afraid of falling at home. You feel weak, drowsy, or dizzy at home. You fall at home. Get help right away if you: Lose consciousness or have trouble moving after a fall. Have a fall that causes a head injury. These symptoms may be an emergency. Get help right away. Call 911. Do not wait to see if the symptoms will go away. Do not drive yourself to the hospital. This information is not intended to replace advice given to you by your health care provider. Make sure you discuss any questions  you have with your health care provider. Document Revised: 06/04/2022 Document Reviewed: 06/04/2022 Elsevier Patient Education  2024 [employer].

## 2024-02-06 NOTE — Progress Notes (Signed)
 Subjective:   Emily Hinton is a 63 y.o. who presents for a Medicare Wellness preventive visit.  Visit Complete: Virtual I connected with  Emily Hinton on 02/06/24 by a audio enabled telemedicine application and verified that I am speaking with the correct person using two identifiers.  Patient Location: Home  Provider Location: Home Office  I discussed the limitations of evaluation and management by telemedicine. The patient expressed understanding and agreed to proceed.  Vital Signs: Because this visit was a virtual/telehealth visit, some criteria may be missing or patient reported. Any vitals not documented were not able to be obtained and vitals that have been documented are patient reported.  VideoDeclined- This patient declined Librarian, academic. Therefore the visit was completed with audio only.  Persons Participating in Visit: Patient.  AWV Questionnaire: No: Patient Medicare AWV questionnaire was not completed prior to this visit.  Cardiac Risk Factors include: diabetes mellitus;hypertension;dyslipidemia;obesity (BMI >30kg/m2);Other (see comment), Risk factor comments: OSA (cpap)     Objective:    Today's Vitals   02/06/24 0807 02/06/24 0808  Weight: 267 lb (121.1 kg)   Height: 5\' 11"  (1.803 m)   PainSc:  7    Body mass index is 37.24 kg/m.     02/06/2024    8:23 AM 01/10/2024   10:38 AM 11/14/2023    5:56 PM 01/28/2023    9:09 AM 01/30/2022    8:35 PM 01/30/2022    3:15 AM  Advanced Directives  Does Patient Have a Medical Advance Directive? Yes No Yes No  No  Type of Estate agent of [place];Living will  Living will;Healthcare Power of Attorney     Does patient want to make changes to medical advance directive? No - Patient declined  No - Patient declined     Copy of Healthcare Power of Attorney in Chart? No - copy requested       Would patient like information on creating a medical  advance directive?  Yes (MAU/Ambulatory/Procedural Areas - Information given)  No - Patient declined No - Patient declined     Current Medications (verified) Outpatient Encounter Medications as of 02/06/2024  Medication Sig   acetaminophen  (TYLENOL ) 500 MG tablet Take 500 mg by mouth every 6 (six) hours as needed.   aspirin  81 MG chewable tablet Chew 1 tablet (81 mg total) by mouth daily.   carvedilol  (COREG ) 25 MG tablet Take 1 tablet (25 mg total) by mouth 2 (two) times daily.   citalopram  (CELEXA ) 20 MG tablet Take 1 tablet (20 mg total) by mouth daily.   empagliflozin  (JARDIANCE ) 25 MG TABS tablet Take 1 tablet (25 mg total) by mouth daily before breakfast. To replace 10mg - place on hold until patient requests please   gabapentin  (NEURONTIN ) 300 MG capsule TAKE 1 CAPSULE(300 MG) BY MOUTH THREE TIMES DAILY   insulin  glargine (LANTUS  SOLOSTAR) 100 UNIT/ML Solostar Pen Inject 30 Units into the skin daily.   metFORMIN  (GLUCOPHAGE ) 1000 MG tablet Take 1 tablet (1,000 mg total) by mouth 2 (two) times daily with a meal.   omeprazole  (PRILOSEC) 20 MG capsule TAKE 1 CAPSULE(20 MG) BY MOUTH DAILY   rosuvastatin  (CRESTOR ) 10 MG tablet TAKE 1 TABLET(10 MG) BY MOUTH DAILY   Semaglutide ,0.25 or 0.5MG /DOS, (OZEMPIC , 0.25 OR 0.5 MG/DOSE,) 2 MG/1.5ML SOPN Inject 0.5 mg into the skin once a week.   spironolactone  (ALDACTONE ) 25 MG tablet TAKE 1 TABLET(25 MG) BY MOUTH DAILY   torsemide  (DEMADEX ) 20 MG tablet Take 2  tablets (40 mg total) by mouth daily for 3 days, THEN 1 tablet (20 mg total) daily.   sacubitril -valsartan  (ENTRESTO ) 97-103 MG Take 1 tablet by mouth 2 (two) times daily. (Patient not taking: Reported on 02/06/2024)   No facility-administered encounter medications on file as of 02/06/2024.    Allergies (verified) Cashew nut (anacardium occidentale) skin test and Cashew nut oil   History: Past Medical History:  Diagnosis Date   CHF (congestive heart failure) (HCC)    CSF leak    Diabetes  mellitus without complication (HCC)    DM2 (diabetes mellitus, type 2) (HCC)    Essential hypertension    Heart attack (HCC)    Hyperlipidemia    Morbid obesity (HCC)    NICM (nonischemic cardiomyopathy) (HCC)    Nonobstructive atherosclerosis of coronary artery    OSA (obstructive sleep apnea)    Past Surgical History:  Procedure Laterality Date   CERVICAL FUSION  2017   CSF leak repair x 2     2014 and 2017   CSF SHUNT  2017   Family History  Problem Relation Age of Onset   Heart failure Father    Hypertension Father    Kidney disease Father    Breast cancer Neg Hx    Social History   Socioeconomic History   Marital status: Divorced    Spouse name: Not on file   Number of children: 2   Years of education: Not on file   Highest education level: Not on file  Occupational History   Occupation: disability  Tobacco Use   Smoking status: Never   Smokeless tobacco: Never  Vaping Use   Vaping status: Never Used  Substance and Sexual Activity   Alcohol use: Never   Drug use: Never   Sexual activity: Not on file  Other Topics Concern   Not on file  Social History Narrative   Not on file   Social Drivers of Health   Financial Resource Strain: Low Risk  (02/06/2024)   Overall Financial Resource Strain (CARDIA)    Difficulty of Paying Living Expenses: Not very hard  Food Insecurity: No Food Insecurity (02/06/2024)   Hunger Vital Sign    Worried About Running Out of Food in the Last Year: Never true    Ran Out of Food in the Last Year: Never true  Transportation Needs: No Transportation Needs (02/06/2024)   PRAPARE - Administrator, Civil Service (Medical): No    Lack of Transportation (Non-Medical): No  Physical Activity: Inactive (02/06/2024)   Exercise Vital Sign    Days of Exercise per Week: 0 days    Minutes of Exercise per Session: 0 min  Stress: No Stress Concern Present (02/06/2024)   [employer] of Occupational Health - Occupational Stress  Questionnaire    Feeling of Stress : Not at all  Social Connections: Moderately Isolated (02/06/2024)   Social Connection and Isolation Panel [NHANES]    Frequency of Communication with Friends and Family: More than three times a week    Frequency of Social Gatherings with Friends and Family: More than three times a week    Attends Religious Services: 1 to 4 times per year    Active Member of [employer] or Organizations: No    Attends Banker Meetings: Never    Marital Status: Divorced    Tobacco Counseling Counseling given: Not Answered    Clinical Intake:  Pre-visit preparation completed: Yes  Pain : 0-10 Pain Score: 7  Pain  Type: Chronic pain Pain Location: Neck Pain Descriptors / Indicators: Aching     BMI - recorded: 37.24 Nutritional Status: BMI > 30  Obese Nutritional Risks: None Diabetes: Yes CBG done?: No (FBS 98 per patient) Did pt. bring in CBG monitor from home?: No  Lab Results  Component Value Date   HGBA1C 6.5 (H) 11/18/2023   HGBA1C 6.7 (H) 08/16/2023   HGBA1C 6.4 (H) 03/08/2023     How often do you need to have someone help you when you read instructions, pamphlets, or other written materials from your doctor or pharmacy?: 1 - Never  Interpreter Needed?: No  Information entered by :: Jaunita Messier, CMA   Activities of Daily Living     02/06/2024    8:10 AM  In your present state of health, do you have any difficulty performing the following activities:  Hearing? 0  Vision? 0  Difficulty concentrating or making decisions? 1  Comment sometimes  Walking or climbing stairs? 1  Comment climbing stairs  Dressing or bathing? 0  Doing errands, shopping? 1  Comment sometimes needs son to drive to appointments  Preparing Food and eating ? N  Using the Toilet? N  In the past six months, have you accidently leaked urine? N  Do you have problems with loss of bowel control? N  Managing your Medications? N  Managing your Finances? N   Housekeeping or managing your Housekeeping? N    Patient Care Team: Aileen Alexanders, NP as PCP - General (Nurse Practitioner) Devorah Fonder, MD as PCP - Cardiology (Cardiology) [place] Elvia Hammans, PA-C as Physician Assistant (Cardiology)  Indicate any recent Medical Services you may have received from other than [facility] providers in the past year (date may be approximate).     Assessment:   This is a routine wellness examination for [place].  Hearing/Vision screen Hearing Screening - Comments:: Denies hearing loss Vision Screening - Comments:: Needs DM eye exam, included list of eye doctors in AVS   Goals Addressed             This Visit's Progress    Patient Stated       Get more sleep       Depression Screen     02/06/2024    8:18 AM 01/10/2024   10:38 AM 12/27/2023   10:25 AM 11/18/2023    2:39 PM 08/16/2023   11:21 AM 05/31/2023   11:06 AM 03/08/2023    9:59 AM  PHQ 2/9 Scores  PHQ - 2 Score 1 0 2 3 2 2  0  PHQ- 9 Score 11  17 14 17 12 7     Fall Risk     02/06/2024    8:24 AM 01/10/2024   10:38 AM 12/27/2023   10:24 AM 08/16/2023   11:21 AM 05/31/2023   11:05 AM  Fall Risk   Falls in the past year? 1 1 0 0 0  Number falls in past yr: 0 0 0 0 0  Injury with Fall? 0 1 0 0 0  Risk for fall due to : History of fall(s);Impaired balance/gait;Orthopedic patient  No Fall Risks No Fall Risks No Fall Risks  Follow up Falls prevention discussed;Falls evaluation completed;Education provided  Falls evaluation completed Falls evaluation completed Falls evaluation completed    MEDICARE RISK AT HOME:  Medicare Risk at Home Any stairs in or around the home?: Yes If so, are there any without handrails?: No Home free of loose throw rugs in walkways, pet beds, electrical  cords, etc?: Yes Adequate lighting in your home to reduce risk of falls?: Yes Life alert?: No Use of a cane, walker or w/c?: No Grab bars in the bathroom?: Yes Shower chair or bench in shower?: Yes Elevated  toilet seat or a handicapped toilet?: Yes  TIMED UP AND GO:  Was the test performed?  No  Cognitive Function: 6CIT completed        02/06/2024    8:25 AM 01/28/2023    9:16 AM  6CIT Screen  What Year? 0 points 0 points  What month? 0 points 0 points  What time? 0 points 0 points  Count back from 20 0 points 0 points  Months in reverse 0 points 0 points  Repeat phrase 4 points 4 points  Total Score 4 points 4 points    Immunizations Immunization History  Administered Date(s) Administered   Influenza,inj,Quad PF,6+ Mos 07/23/2013, 10/06/2014, 07/28/2015, 07/17/2016, 08/14/2022   Influenza-Unspecified 08/15/2020   Moderna Sars-Covid-2 Vaccination 09/16/2020   PFIZER(Purple Top)SARS-COV-2 Vaccination 12/15/2019, 01/05/2020   PNEUMOCOCCAL CONJUGATE-20 03/27/2022   PPD Test 03/14/2021   Pneumococcal Polysaccharide-23 11/09/2013   Tdap 11/09/2013    Screening Tests Health Maintenance  Topic Date Due   Zoster Vaccines- Shingrix (1 of 2) Never done   Cervical Cancer Screening (HPV/Pap Cotest)  11/26/2021   COVID-19 Vaccine (4 - 2024-25 season) 06/16/2023   OPHTHALMOLOGY EXAM  08/07/2023   FOOT EXAM  08/15/2023   MAMMOGRAM  11/09/2023   DTaP/Tdap/Td (2 - Td or Tdap) 11/10/2023   Diabetic kidney evaluation - Urine ACR  12/08/2023   INFLUENZA VACCINE  05/15/2024   HEMOGLOBIN A1C  05/17/2024   Colonoscopy  08/09/2024   Diabetic kidney evaluation - eGFR measurement  12/26/2024   Medicare Annual Wellness (AWV)  02/05/2025   Pneumococcal Vaccine 19-53 Years old  Completed   Hepatitis C Screening  Completed   HIV Screening  Completed   HPV VACCINES  Aged Out   Meningococcal B Vaccine  Aged Out    Health Maintenance  Health Maintenance Due  Topic Date Due   Zoster Vaccines- Shingrix (1 of 2) Never done   Cervical Cancer Screening (HPV/Pap Cotest)  11/26/2021   COVID-19 Vaccine (4 - 2024-25 season) 06/16/2023   OPHTHALMOLOGY EXAM  08/07/2023   FOOT EXAM  08/15/2023    MAMMOGRAM  11/09/2023   DTaP/Tdap/Td (2 - Td or Tdap) 11/10/2023   Diabetic kidney evaluation - Urine ACR  12/08/2023   Health Maintenance Items Addressed: Mammogram ordered, See Nurse Notes  Additional Screening:  Vision Screening: Recommended annual ophthalmology exams for early detection of glaucoma and other disorders of the eye.  Dental Screening: Recommended annual dental exams for proper oral hygiene  Community Resource Referral / Chronic Care Management: CRR required this visit?  No   CCM required this visit?  No     Plan:     I have personally reviewed and noted the following in the patient's chart:   Medical and social history Use of alcohol, tobacco or illicit drugs  Current medications and supplements including opioid prescriptions. Patient is not currently taking opioid prescriptions. Functional ability and status Nutritional status Physical activity Advanced directives List of other physicians Hospitalizations, surgeries, and ER visits in previous 12 months Vitals Screenings to include cognitive, depression, and falls Referrals and appointments  In addition, I have reviewed and discussed with patient certain preventive protocols, quality metrics, and best practice recommendations. A written personalized care plan for preventive services as well as general preventive health  recommendations were provided to patient.     Jaunita Messier, CMA   02/06/2024   After Visit Summary: (MyChart) Due to this being a telephonic visit, the after visit summary with patients personalized plan was offered to patient via MyChart   Notes:  6 CIT Score - 4 FBS 98 this morning per patient Placed order for MMG Needs DM eye exam. Included list of eye doctors in the area in AVS. Needs DM foot exam at next OV on 04/03/24 Needs Tdap vaccine (pharmacy) Declined shingles vaccine

## 2024-02-10 ENCOUNTER — Other Ambulatory Visit: Payer: Self-pay | Admitting: Nurse Practitioner

## 2024-02-12 NOTE — Telephone Encounter (Signed)
 Requested Prescriptions  Pending Prescriptions Disp Refills   metFORMIN  (GLUCOPHAGE ) 1000 MG tablet [Pharmacy Med Name: METFORMIN  1000MG  TABLETS] 180 tablet 0    Sig: TAKE 1 TABLET(1000 MG) BY MOUTH TWICE DAILY WITH A MEAL     Endocrinology:  Diabetes - Biguanides Failed - 02/12/2024  3:48 PM      Failed - Cr in normal range and within 360 days    Creatinine, Ser  Date Value Ref Range Status  12/27/2023 1.04 (H) 0.57 - 1.00 mg/dL Final         Failed - B12 Level in normal range and within 720 days    No results found for: "VITAMINB12"       Passed - HBA1C is between 0 and 7.9 and within 180 days    Hgb A1c MFr Bld  Date Value Ref Range Status  11/18/2023 6.5 (H) 4.8 - 5.6 % Final    Comment:             Prediabetes: 5.7 - 6.4          Diabetes: >6.4          Glycemic control for adults with diabetes: <7.0          Passed - eGFR in normal range and within 360 days    GFR, Estimated  Date Value Ref Range Status  11/14/2023 >60 >60 mL/min Final    Comment:    (NOTE) Calculated using the CKD-EPI Creatinine Equation (2021)    eGFR  Date Value Ref Range Status  12/27/2023 61 >59 mL/min/1.73 Final         Passed - Valid encounter within last 6 months    Recent Outpatient Visits           1 month ago HFrEF (heart failure with reduced ejection fraction) (HCC)   [facility] Aileen Alexanders, NP   2 months ago Acute on chronic diastolic CHF (congestive heart failure) (HCC)   [facility] Aileen Alexanders, NP              Passed - CBC within normal limits and completed in the last 12 months    WBC  Date Value Ref Range Status  11/18/2023 5.7 3.4 - 10.8 x10E3/uL Final  11/14/2023 6.7 4.0 - 10.5 K/uL Final   RBC  Date Value Ref Range Status  11/18/2023 5.41 (H) 3.77 - 5.28 x10E6/uL Final  11/14/2023 4.90 3.87 - 5.11 MIL/uL Final   Hemoglobin  Date Value Ref Range Status  11/18/2023 15.4 11.1 - 15.9 g/dL Final    Hematocrit  Date Value Ref Range Status  11/18/2023 47.0 (H) 34.0 - 46.6 % Final   MCHC  Date Value Ref Range Status  11/18/2023 32.8 31.5 - 35.7 g/dL Final  98/08/9146 82.9 30.0 - 36.0 g/dL Final   [facility]  Date Value Ref Range Status  11/18/2023 28.5 26.6 - 33.0 pg Final  11/14/2023 28.2 26.0 - 34.0 pg Final   MCV  Date Value Ref Range Status  11/18/2023 87 79 - 97 fL Final   No results found for: "PLTCOUNTKUC", "LABPLAT", "POCPLA" RDW  Date Value Ref Range Status  11/18/2023 14.1 11.7 - 15.4 % Final

## 2024-02-14 NOTE — Telephone Encounter (Signed)
 Faxed provider portion today to [phone] but was receiving back with out provider signature on pap, call the office spoke with April gave me a different number [phone],faxed it again today@3  pm 02/14/24 will follow up.

## 2024-02-26 NOTE — Telephone Encounter (Signed)
 Staff message and phone message left for PAP team Lindaann Requena Peever) on 5/12 and 5/13 - paperwork sent to Verdie Gladden still has Aileen Alexanders, MD info in place of prescriber so Verdie Gladden can't complete paperwork for Pt Assistance   Thank you

## 2024-03-01 ENCOUNTER — Other Ambulatory Visit: Payer: Self-pay | Admitting: Nurse Practitioner

## 2024-03-03 NOTE — Telephone Encounter (Signed)
 Requested Prescriptions  Pending Prescriptions Disp Refills   gabapentin  (NEURONTIN ) 300 MG capsule [Pharmacy Med Name: GABAPENTIN  300MG  CAPSULES] 270 capsule 1    Sig: TAKE 1 CAPSULE(300 MG) BY MOUTH THREE TIMES DAILY     Neurology: Anticonvulsants - gabapentin  Failed - 03/03/2024  5:18 PM      Failed - Cr in normal range and within 360 days    Creatinine, Ser  Date Value Ref Range Status  12/27/2023 1.04 (H) 0.57 - 1.00 mg/dL Final         Passed - Completed PHQ-2 or PHQ-9 in the last 360 days      Passed - Valid encounter within last 12 months    Recent Outpatient Visits           2 months ago HFrEF (heart failure with reduced ejection fraction) [facility])   [place] [facility] Aileen Alexanders, NP   3 months ago Acute on chronic diastolic CHF (congestive heart failure) [facility])   [place] [facility] Aileen Alexanders, NP

## 2024-03-05 NOTE — Telephone Encounter (Signed)
 Received provider portion of application [employer] today and will be faxing it to company today and following up in a few days.

## 2024-03-13 ENCOUNTER — Other Ambulatory Visit: Payer: Self-pay | Admitting: Nurse Practitioner

## 2024-03-14 NOTE — Telephone Encounter (Signed)
 Requested Prescriptions  Pending Prescriptions Disp Refills   omeprazole  (PRILOSEC) 20 MG capsule [Pharmacy Med Name: OMEPRAZOLE  20MG  CAPSULES] 90 capsule 1    Sig: TAKE 1 CAPSULE(20 MG) BY MOUTH DAILY     Gastroenterology: Proton Pump Inhibitors Passed - 03/14/2024  3:20 PM      Passed - Valid encounter within last 12 months    Recent Outpatient Visits           2 months ago HFrEF (heart failure with reduced ejection fraction) [facility])   [place] [facility] Aileen Alexanders, NP   3 months ago Acute on chronic diastolic CHF (congestive heart failure) [facility])   [place] [facility] Aileen Alexanders, NP

## 2024-03-25 NOTE — Telephone Encounter (Signed)
 Gave [employer] a call following up on pt application,spoke with representative said on provider portion pg#9 provider only put quantity of @ boxes she said it must be quantity of 4 boxes fixed provider portion to equal 4 boxes and re-faxed it to [employer] today. And will follow up in a couple of days.

## 2024-03-27 NOTE — Telephone Encounter (Signed)
 Gave [employer] a call to follow up on pt's PAP spoke with a representative said pt has been approve until 04/09/25 on Ozempic  0.5 mg,left a HIPAA VM at pt number,[employer] has mail out to provider office and will received in 10-14 days.

## 2024-03-31 ENCOUNTER — Other Ambulatory Visit: Payer: Self-pay | Admitting: Nurse Practitioner

## 2024-04-02 NOTE — Telephone Encounter (Signed)
 Requested Prescriptions  Refused Prescriptions Disp Refills   citalopram  (CELEXA ) 20 MG tablet [Pharmacy Med Name: CITALOPRAM  20MG  TABLETS] 90 tablet 1    Sig: TAKE 1 TABLET(20 MG) BY MOUTH DAILY     Psychiatry:  Antidepressants - SSRI Passed - 04/02/2024 11:27 AM      Passed - Completed PHQ-2 or PHQ-9 in the last 360 days      Passed - Valid encounter within last 6 months    Recent Outpatient Visits           3 months ago HFrEF (heart failure with reduced ejection fraction) [facility])   [place] [facility] Aileen Alexanders, NP   4 months ago Acute on chronic diastolic CHF (congestive heart failure) [facility])   [place] [facility] Aileen Alexanders, NP

## 2024-04-03 ENCOUNTER — Encounter: Payer: Self-pay | Admitting: Nurse Practitioner

## 2024-04-03 ENCOUNTER — Telehealth: Admitting: Nurse Practitioner

## 2024-04-03 DIAGNOSIS — I10 Essential (primary) hypertension: Secondary | ICD-10-CM

## 2024-04-03 DIAGNOSIS — I5020 Unspecified systolic (congestive) heart failure: Secondary | ICD-10-CM | POA: Diagnosis not present

## 2024-04-03 DIAGNOSIS — Z7984 Long term (current) use of oral hypoglycemic drugs: Secondary | ICD-10-CM

## 2024-04-03 DIAGNOSIS — E119 Type 2 diabetes mellitus without complications: Secondary | ICD-10-CM

## 2024-04-03 DIAGNOSIS — Z982 Presence of cerebrospinal fluid drainage device: Secondary | ICD-10-CM | POA: Diagnosis not present

## 2024-04-03 DIAGNOSIS — Z7985 Long-term (current) use of injectable non-insulin antidiabetic drugs: Secondary | ICD-10-CM

## 2024-04-03 DIAGNOSIS — Z794 Long term (current) use of insulin: Secondary | ICD-10-CM

## 2024-04-03 DIAGNOSIS — F339 Major depressive disorder, recurrent, unspecified: Secondary | ICD-10-CM

## 2024-04-03 NOTE — Assessment & Plan Note (Signed)
 Chronic.  Improved.  Has not followed up with Cardiology.  Does weigh daily- 267lb.  Recommend patient make an appointment and attend the appointment with cardiology.   Per patient breathing has improved and swelling have improved with Torsemide . Follow up in 3 months.  Call sooner if concerns arise. Will help patient make appointment with Cardiology.   - Reminded to call for an overnight weight gain of >2 pounds or a weekly weight gain of >5 pounds - not adding salt to food and read food labels. Reviewed the importance of keeping daily sodium intake to 2000mg  daily. - Avoid Ibuprofen products.

## 2024-04-03 NOTE — Assessment & Plan Note (Addendum)
 Has not seen Neurology or Neurosurgery.  New referral placed today. Patient agrees to make an appt.

## 2024-04-03 NOTE — Assessment & Plan Note (Signed)
Chronic.  Controlled.  Continue with current medication regimen of Carvedilol and Valsartan (Entresto).  Labs ordered today.  Return to clinic in 3 months for reevaluation.  Call sooner if concerns arise.   

## 2024-04-03 NOTE — Assessment & Plan Note (Signed)
 Chronic.  Controlled.  Continue with current medication regimen.  Labs ordered today.  Return to clinic in 6 months for reevaluation.  Call sooner if concerns arise.  ? ?

## 2024-04-03 NOTE — Progress Notes (Signed)
 BP 120/70 (BP Location: Right Arm, Patient Position: Sitting, Cuff Size: Large)    Subjective:    Patient ID: Emily Hinton, female    DOB: 07-Sep-1961, 63 y.o.   MRN: [id number]  HPI: Emily Hinton is a 63 y.o. female  Chief Complaint  Patient presents with   Hypertension    Home checks daily.    Congestive Heart Failure    Going well no new concerns.    Leg Injury    Has had a hard time lifting her left leg the last 2 weeks, no swelling but does take lasix . Night time burning and tingling. Does feel it has given out over that time but is able to brace from falling on a wall.    HYPERTENSION / HYPERLIPIDEMIA/HF Patient has not seen the HF clinic recently- plans to make an appt. Plans to call Cardiology to make an appt also.  Patient did not keep her appointment with Cardiology.  Does not have an upcoming appointment. Has been weighing daily.  Torsemide  was changed to daily.  She is having to go to the bathroom a lot due to the Torsemide . However, swelling is improved per the patient due to medication change. Satisfied with current treatment? yes Duration of hypertension: years BP monitoring frequency: daily BP range: 120/70 BP medication side effects: no Past BP meds: carvedilol  and valsartan  Duration of hyperlipidemia: years Cholesterol medication side effects: no Cholesterol supplements: none Past cholesterol medications: rosuvastatin  (crestor ) Medication compliance: excellent compliance Aspirin : no Recent stressors: no Recurrent headaches: no Visual changes: no Palpitations: no Dyspnea: yes- but improving Chest pain: no Lower extremity edema: no- controlled with torsemide  Dizzy/lightheaded: no  DIABETES Patient states she is taking Ozempic  0.5mg  and tolerating it well.  She does not tolerate higher doses of Ozempic .  Last A1c 6.5%.   Hypoglycemic episodes:no Polydipsia/polyuria: no Visual disturbance: no Chest pain: no Paresthesias:  no Glucose Monitoring: yes  Accucheck frequency: Daily  Fasting glucose: 126  Post prandial:  Evening:  Before meals: Taking Insulin ?: yes  Long acting insulin : Lantus  30u  Short acting insulin :  Blood Pressure Monitoring: daily Retinal Examination: Up to Date Foot Exam: up to date Diabetic Education: Not Completed Pneumovax: Up to Date Influenza: Up to Date Aspirin : no  VP SHUNT Hasn't seen Neurology recently. Referral previously placed.  DEPRESSION/ANXIETY Patient states he mood has been better.  She is taking the increased dose of Celexa .  She does still have anxiety with driving.  Her sons still cause her a lot of stress.  Feels like she would benefit from an increased dose of Celexa .  She denies concerns at visit today.         Relevant past medical, surgical, family and social history reviewed and updated as indicated. Interim medical history since our last visit reviewed. Allergies and medications reviewed and updated.  Review of Systems  Eyes:  Negative for visual disturbance.  Respiratory:  Negative for cough, chest tightness and shortness of breath.   Cardiovascular:  Negative for chest pain, palpitations and leg swelling.  Endocrine: Negative for polydipsia and polyuria.  Neurological:  Negative for dizziness, numbness and headaches.  Psychiatric/Behavioral:  Positive for dysphoric mood. Negative for suicidal ideas. The patient is nervous/anxious.     Per HPI unless specifically indicated above     Objective:    BP 120/70 (BP Location: Right Arm, Patient Position: Sitting, Cuff Size: Large)   Wt Readings from Last 3 Encounters:  02/06/24 267 lb (121.1 kg)  12/27/23 268  lb 6.4 oz (121.7 kg)  11/18/23 261 lb 3.2 oz (118.5 kg)    Physical Exam Vitals and nursing note reviewed.  Constitutional:      General: She is not in acute distress.    Appearance: She is not ill-appearing.  HENT:     Head: Normocephalic.     Right Ear: Hearing normal.     Left  Ear: Hearing normal.     Nose: Nose normal.  Pulmonary:     Effort: Pulmonary effort is normal. No respiratory distress.   Neurological:     Mental Status: She is alert.   Psychiatric:        Mood and Affect: Mood normal.        Behavior: Behavior normal.        Thought Content: Thought content normal.        Judgment: Judgment normal.     Results for orders placed or performed in visit on 12/27/23  Comp Met (CMET)   Collection Time: 12/27/23 10:45 AM  Result Value Ref Range   Glucose 186 (H) 70 - 99 mg/dL   BUN 13 8 - 27 mg/dL   Creatinine, Ser 1.61 (H) 0.57 - 1.00 mg/dL   eGFR 61 >09 UE/AVW/0.98   BUN/Creatinine Ratio 13 12 - 28   Sodium 139 134 - 144 mmol/L   Potassium 4.6 3.5 - 5.2 mmol/L   Chloride 100 96 - 106 mmol/L   CO2 22 20 - 29 mmol/L   Calcium  9.4 8.7 - 10.3 mg/dL   Total Protein 7.4 6.0 - 8.5 g/dL   Albumin 4.4 3.9 - 4.9 g/dL   Globulin, Total 3.0 1.5 - 4.5 g/dL   Bilirubin Total 0.5 0.0 - 1.2 mg/dL   Alkaline Phosphatase 88 44 - 121 IU/L   AST 24 0 - 40 IU/L   ALT 29 0 - 32 IU/L      Assessment & Plan:   Problem List Items Addressed This Visit       Cardiovascular and Mediastinum   Essential hypertension   Chronic.  Controlled.  Continue with current medication regimen of Carvedilol  and Valsartan  (Entresto ).  Labs ordered today.  Return to clinic in 3 months for reevaluation.  Call sooner if concerns arise.       HFrEF (heart failure with reduced ejection fraction) (HCC)   Chronic.  Improved.  Has not followed up with Cardiology.  Does weigh daily- 267lb.  Recommend patient make an appointment and attend the appointment with cardiology.   Per patient breathing has improved and swelling have improved with Torsemide . Follow up in 3 months.  Call sooner if concerns arise. Will help patient make appointment with Cardiology.   - Reminded to call for an overnight weight gain of >2 pounds or a weekly weight gain of >5 pounds - not adding salt to food and  read food labels. Reviewed the importance of keeping daily sodium intake to 2000mg  daily. - Avoid Ibuprofen products.      Relevant Orders   Comprehensive metabolic panel with GFR     Endocrine   Diabetes mellitus (HCC)   Chronic.  Ongoing. On Ozempic , Metformin  and Jardiance  and Lantus  30u.  Doing well with current regimen.  Sugars are running <130. Labs ordered today. Will make recommendations based on results. Follow up in 3 months.  Call sooner if concerns arise.       Relevant Orders   Hemoglobin A1c     Other   Morbid obesity (HCC) - Primary  Recommended eating smaller high protein, low fat meals more frequently and exercising 30 mins a day 5 times a week with a goal of 10-15lb weight loss in the next 3 months. She has an appt with a Nutritionist coming up.  Would like to titrate up on dose  of Ozempic  but patient is not able to tolerate higher than 0.5mg .       S/P VP shunt   Has not seen Neurology or Neurosurgery.  New referral placed today. Patient agrees to make an appt.      Relevant Orders   Ambulatory referral to Neurology   Depression, recurrent (HCC)   Chronic.  Controlled.  Continue with current medication regimen.  Labs ordered today.  Return to clinic in 6 months for reevaluation.  Call sooner if concerns arise.             Follow up plan: Return in about 3 months (around 07/04/2024) for HTN, HLD, DM2 FU.   This visit was completed via MyChart due to the restrictions of the COVID-19 pandemic. All issues as above were discussed and addressed. Physical exam was done as above through visual confirmation on MyChart. If it was felt that the patient should be evaluated in the office, they were directed there. The patient verbally consented to this visit. Location of the patient: Home Location of the provider: Office Those involved with this call:  Provider: Aileen Alexanders, NP CMA: Althia Jetty, CMA Front Desk/Registration: Jaynee Meyer This encounter  was conducted via video.  I spent 20 minustes dedicated to the care of this patient on the date of this encounter to include previsit review of plan of care, medications, follow up with specialists, face to face time with the patient, and post visit ordering of testing.

## 2024-04-03 NOTE — Assessment & Plan Note (Signed)
 Recommended eating smaller high protein, low fat meals more frequently and exercising 30 mins a day 5 times a week with a goal of 10-15lb weight loss in the next 3 months. She has an appt with a Nutritionist coming up.  Would like to titrate up on dose  of Ozempic  but patient is not able to tolerate higher than 0.5mg .

## 2024-04-03 NOTE — Assessment & Plan Note (Signed)
 Chronic.  Ongoing. On Ozempic , Metformin  and Jardiance  and Lantus  30u.  Doing well with current regimen.  Sugars are running <130. Labs ordered today. Will make recommendations based on results. Follow up in 3 months.  Call sooner if concerns arise.

## 2024-05-28 ENCOUNTER — Telehealth: Payer: Self-pay | Admitting: Cardiovascular Disease

## 2024-05-28 NOTE — Telephone Encounter (Signed)
 Calling to make provider aware that they are faxing an updated refill form for Ozempic  due to having the incorrect address for office. Please advise

## 2024-05-29 NOTE — Telephone Encounter (Signed)
 Cedric at [employer] was called and reports that the UPS couldn't deliver the patient's Ozempic  because the address was recorded as [address].  Additionally no suite was specified.  Since this medicine requires refrigeration it was returned to the originator.  When the correction fax arrives we must write the correct address on this form and fax it back to get the patient their medicine.  ALL: Please update your forms with the correct mailing address:  [facility]  [address], [address], [place]  [place], [place] [address]

## 2024-06-03 NOTE — Telephone Encounter (Signed)
 Faxing provider portion due to [employer] having a new application version,have faxed provider portion to [phone] today

## 2024-06-03 NOTE — Telephone Encounter (Signed)
 See chart media. Please have provider complete their portion.

## 2024-06-04 NOTE — Telephone Encounter (Signed)
 Application signed by provider and faxing back to [employer]

## 2024-06-04 NOTE — Telephone Encounter (Signed)
 Received provider portion with new provider address change,faxed to [employer] today,will follow to make sure they have received it.

## 2024-06-05 ENCOUNTER — Other Ambulatory Visit: Payer: Self-pay | Admitting: Nurse Practitioner

## 2024-06-05 DIAGNOSIS — E781 Pure hyperglyceridemia: Secondary | ICD-10-CM

## 2024-06-08 NOTE — Telephone Encounter (Signed)
 Requested Prescriptions  Pending Prescriptions Disp Refills   rosuvastatin  (CRESTOR ) 10 MG tablet [Pharmacy Med Name: ROSUVASTATIN  10MG  TABLETS] 90 tablet 0    Sig: TAKE 1 TABLET(10 MG) BY MOUTH DAILY     Cardiovascular:  Antilipid - Statins 2 Failed - 06/08/2024 11:26 AM      Failed - Cr in normal range and within 360 days    Creatinine, Ser  Date Value Ref Range Status  12/27/2023 1.04 (H) 0.57 - 1.00 mg/dL Final         Failed - Lipid Panel in normal range within the last 12 months    Cholesterol, Total  Date Value Ref Range Status  08/16/2023 108 100 - 199 mg/dL Final   LDL Chol Calc (NIH)  Date Value Ref Range Status  08/16/2023 42 0 - 99 mg/dL Final   HDL  Date Value Ref Range Status  08/16/2023 48 >39 mg/dL Final   Triglycerides  Date Value Ref Range Status  08/16/2023 97 0 - 149 mg/dL Final         Passed - Patient is not pregnant      Passed - Valid encounter within last 12 months    Recent Outpatient Visits           2 months ago Morbid obesity [facility])   [place] Melvin Pao, NP   5 months ago HFrEF (heart failure with reduced ejection fraction) [facility])   [place] [facility] Melvin Pao, NP   6 months ago Acute on chronic diastolic CHF (congestive heart failure) (HCC)   [place] [facility] Melvin Pao, NP       Future Appointments             In 2 weeks Dunn, Bernardino HERO, PA-C [facility]

## 2024-06-18 DIAGNOSIS — R519 Headache, unspecified: Secondary | ICD-10-CM | POA: Diagnosis not present

## 2024-06-22 ENCOUNTER — Other Ambulatory Visit: Payer: Self-pay | Admitting: Neurology

## 2024-06-22 DIAGNOSIS — R519 Headache, unspecified: Secondary | ICD-10-CM

## 2024-06-23 ENCOUNTER — Other Ambulatory Visit: Payer: Self-pay | Admitting: Nurse Practitioner

## 2024-06-23 DIAGNOSIS — E781 Pure hyperglyceridemia: Secondary | ICD-10-CM

## 2024-06-24 NOTE — Telephone Encounter (Signed)
 Too soon for refill.  Requested Prescriptions  Pending Prescriptions Disp Refills   rosuvastatin  (CRESTOR ) 10 MG tablet [Pharmacy Med Name: ROSUVASTATIN  10MG  TABLETS] 90 tablet 0    Sig: TAKE 1 TABLET(10 MG) BY MOUTH DAILY     Cardiovascular:  Antilipid - Statins 2 Failed - 06/24/2024 11:18 AM      Failed - Cr in normal range and within 360 days    Creatinine, Ser  Date Value Ref Range Status  12/27/2023 1.04 (H) 0.57 - 1.00 mg/dL Final         Failed - Lipid Panel in normal range within the last 12 months    Cholesterol, Total  Date Value Ref Range Status  08/16/2023 108 100 - 199 mg/dL Final   LDL Chol Calc (NIH)  Date Value Ref Range Status  08/16/2023 42 0 - 99 mg/dL Final   HDL  Date Value Ref Range Status  08/16/2023 48 >39 mg/dL Final   Triglycerides  Date Value Ref Range Status  08/16/2023 97 0 - 149 mg/dL Final         Passed - Patient is not pregnant      Passed - Valid encounter within last 12 months    Recent Outpatient Visits           2 months ago Morbid obesity (HCC)   [place] [facility] Melvin Pao, NP   6 months ago HFrEF (heart failure with reduced ejection fraction) [facility])   [place] [facility] Melvin Pao, NP   7 months ago Acute on chronic diastolic CHF (congestive heart failure) [facility])   [place] [facility] Melvin Pao, NP       Future Appointments             In 2 days Dunn, Bernardino HERO, PA-C [place] [facility]             gabapentin  (NEURONTIN ) 300 MG capsule [Pharmacy Med Name: GABAPENTIN  300MG  CAPSULES] 270 capsule 1    Sig: TAKE 1 CAPSULE(300 MG) BY MOUTH THREE TIMES DAILY     Neurology: Anticonvulsants - gabapentin  Failed - 06/24/2024 11:18 AM      Failed - Cr in normal range and within 360 days    Creatinine, Ser  Date Value Ref Range Status  12/27/2023 1.04 (H) 0.57 - 1.00 mg/dL Final         Passed - Completed PHQ-2 or PHQ-9 in the last 360  days      Passed - Valid encounter within last 12 months    Recent Outpatient Visits           2 months ago Morbid obesity [facility])   [place] [facility] Melvin Pao, NP   6 months ago HFrEF (heart failure with reduced ejection fraction) [facility])   [place] [facility] Melvin Pao, NP   7 months ago Acute on chronic diastolic CHF (congestive heart failure) [facility])   [facility] Melvin Pao, NP       Future Appointments             In 2 days Dunn, Bernardino HERO, PA-C [place] [facility]

## 2024-06-25 NOTE — Progress Notes (Unsigned)
 Cardiology Office Note    Date:  06/26/2024   ID:  Emily Hinton, DOB Mar 14, 1961, MRN [id number]  PCP:  Melvin Pao, NP  Cardiologist:  Evalene Lunger, MD  Electrophysiologist:  None   Chief Complaint: Follow-up  History of Present Illness:   Emily Hinton is a 63 y.o. 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   Emily Hinton was previously followed by cardiology in [place]. She was found to have a cardiomyopathy in 2018 with an EF of 45%. LHC at that time showed nonobstructive coronary arteries.  Echo in 05/2018 demonstrated a persistent cardiomyopathy with an EF of 45%, global hypokinesis, and diastolic dysfunction. This finding was unchanged when compared to echo in 2018.  Subsequent echo in 04/2021 showed an improved LV systolic function with an EF of 55%, probable normal LV wall motion, and diastolic dysfunction. She moved to [facility] from [place] several years ago.    She was admitted to the hospital in 01/2022 with worsening dyspnea and productive cough and found to have multilobar pneumonia with mild volume overload.  She was hypertensive and tachycardic upon presentation with an oxygen saturation of 72% on room air.  Chest x-ray showed volume overload versus pneumonia with small pleural effusions.  CT of the chest showed multilobar pneumonia with minimal pleural fluid on the left.  Echo during this admission showed an EF of 40-45%, mildly to moderately dilated LV internal cavity size, global hypokinesis, Gr2DD, normal RV systolic function and ventricular cavity size,  and mild mitral regurgitation.     Coronary CTA on 03/01/2022 showed a calcium  score of 0, with no evidence of CAD.  Noncardiac over read was notable for near complete resolution of groundglass opacities when compared to imaging from 01/2022. Limited echo 06/21/2022 showed a persistent cardiomyopathy with an EF of 35 to 40%, global hypokinesis, mildly dilated internal LV cavity size, grade 1 diastolic dysfunction, normal RV systolic function and ventricular cavity size, mild mitral regurgitation, and an estimated right atrial pressure of 3 mmHg.  Cardiac MRI on 07/18/2022 demonstrated an LVEF of 32% with no evidence of MI, scar, or LGE with normal RV systolic function and ventricular cavity size and no significant valvular abnormalities.  Findings were suggestive of nonischemic cardiomyopathy.   Given persistent cardiomyopathy despite optimization of GDMT, she was referred to the advanced heart failure service and evaluated by them on 11/30/2022, at which time she was noted to have a mildly elevated ReDs vest of 36%.  Her dyspnea was felt to be somewhat out of proportion to her exam.  CPX was a submaximal test with the resting spirometry suggestive of moderate restrictive lung function in the setting of severe obesity.  Exercise testing revealed a moderate functional limitation due to primarily obesity and deconditioning.  There was mild chronotropic incompetence in the setting of submaximal aerobic effort with no other evidence of significant heart failure limitation.  She has been referred to our pharmacy team for bariatric management.   Follow-up chest CT for pulmonary nodule 04/26/2023 showed interval resolution of airspace opacities with no suspicious pulmonary nodule.  Partially visualized indeterminate lesion of the  right hepatic lobe measuring 1.5 cm.  She was advised to follow-up with PCP for liver lesion.   She was admitted to the hospital in [place]  in 07/2023 with episodes of hemoptysis and  blood-streaked emesis and signs of infectious pneumonitis with inflammatory changes felt to be less likely.  No evidence of PE on CTA chest.  She had traveled from [place] to [place] , approximately 9-hour trip.  She was treated with ceftriaxone  and azithromycin  with transition to cefpodoxime and azithromycin  as well as tranexamic as needed nebs with considerable improvement in hemoptysis.  Bronchoscopy showed no signs of pulmonary hemorrhage bilaterally to the subsegmental level.  Serial BAL's were performed in the right middle lobe with no grossly emergent return.  Bronchial culture, AFB and fungal culture with no growth.    She was last seen in the office in 09/2023 noting chronic stable fatigue and dyspnea.  Echo in 10/2023 showed an EF of 30 to 35%, no regional wall motion abnormalities, moderately dilated LV internal cavity size, normal RV systolic function and ventricular cavity size, moderately dilated left atrium, moderate to severe mitral regurgitation, and an estimated right atrial pressure of 3 mmHg.  Following these results, advanced heart failure recommended follow-up for consideration of ICD, she canceled appointment.  She was seen in [facility] ED in 10/2023 for shortness of breath with CTA chest showing no evidence of PE with mild pulmonary edema and reactive hilar lymph nodes noted.  BNP at that time of 578.  She was treated with IV Lasix  and advised to follow-up with the CHF clinic.  She comes in doing well from a cardiac perspective and is without symptoms of angina or cardiac decompensation.  She is under increased stress at home surrounding the health of her father who is currently passing.  No significant lower extremity swelling or progressive orthopnea.  Adherent and tolerating cardiac pharmacotherapy without untoward effect.  No dizziness, presyncope, syncope, falls, or symptoms concerning for bleeding.  No progressive orthopnea.  Continues to deal with insomnia.   Labs independently  reviewed: 06/2024 - TSH normal 12/2023 - BUN 13, serum creatinine 1.04, potassium 4.6, BUN 4.4, AST/ALT normal 11/2023 - Hgb 15.4, PLT 360, A1c 6.5 10/2023 - BNP 578 08/2023 - TC 108, TG 97, HDL 48, LDL 42  Past Medical History:  Diagnosis Date   CHF (congestive heart failure) (HCC)    CSF leak    Diabetes mellitus without complication (HCC)    DM2 (diabetes mellitus, type 2) (HCC)    Essential hypertension    Heart attack (HCC)    Hyperlipidemia    Morbid obesity (HCC)    NICM (nonischemic cardiomyopathy) (HCC)    Nonobstructive atherosclerosis of coronary artery    OSA (obstructive sleep apnea)     Past Surgical History:  Procedure Laterality Date   CERVICAL FUSION  2017   CSF leak repair x 2     2014 and 2017   CSF SHUNT  2017    Current Medications: Current Meds  Medication Sig   acetaminophen  (TYLENOL ) 500 MG tablet Take 500 mg by mouth every 6 (six) hours as needed.   aspirin  81 MG chewable tablet Chew 1 tablet (81 mg total) by mouth daily.   carvedilol  (COREG ) 25 MG tablet Take 1 tablet (25 mg total) by mouth 2 (two) times daily.   citalopram  (CELEXA ) 20 MG tablet Take 1 tablet (20 mg total) by mouth daily.   empagliflozin  (JARDIANCE ) 25 MG TABS tablet Take 1 tablet (25 mg  total) by mouth daily before breakfast. To replace 10mg - place on hold until patient requests please   gabapentin  (NEURONTIN ) 300 MG capsule TAKE 1 CAPSULE(300 MG) BY MOUTH THREE TIMES DAILY   insulin  glargine (LANTUS  SOLOSTAR) 100 UNIT/ML Solostar Pen Inject 30 Units into the skin daily.   metFORMIN  (GLUCOPHAGE ) 1000 MG tablet TAKE 1 TABLET(1000 MG) BY MOUTH TWICE DAILY WITH A MEAL   omeprazole  (PRILOSEC) 20 MG capsule TAKE 1 CAPSULE(20 MG) BY MOUTH DAILY   rosuvastatin  (CRESTOR ) 10 MG tablet TAKE 1 TABLET(10 MG) BY MOUTH DAILY   sacubitril -valsartan  (ENTRESTO ) 97-103 MG Take 1 tablet by mouth 2 (two) times daily.   Semaglutide ,0.25 or 0.5MG /DOS, (OZEMPIC , 0.25 OR 0.5 MG/DOSE,) 2 MG/1.5ML SOPN Inject  0.5 mg into the skin once a week.   spironolactone  (ALDACTONE ) 25 MG tablet TAKE 1 TABLET(25 MG) BY MOUTH DAILY   [DISCONTINUED] torsemide  (DEMADEX ) 20 MG tablet Take 2 tablets (40 mg total) by mouth daily for 3 days, THEN 1 tablet (20 mg total) daily.    Allergies:   Cashew nut (anacardium occidentale) skin test and Cashew nut oil   Social History   Socioeconomic History   Marital status: Divorced    Spouse name: Not on file   Number of children: 2   Years of education: Not on file   Highest education level: Not on file  Occupational History   Occupation: disability  Tobacco Use   Smoking status: Never   Smokeless tobacco: Never  Vaping Use   Vaping status: Never Used  Substance and Sexual Activity   Alcohol use: Never   Drug use: Never   Sexual activity: Not on file  Other Topics Concern   Not on file  Social History Narrative   Not on file   Social Drivers of Health   Financial Resource Strain: Low Risk  (06/18/2024)   Received from [facility]   Overall Financial Resource Strain (CARDIA)    Difficulty of Paying Living Expenses: Not hard at all  Food Insecurity: No Food Insecurity (06/18/2024)   Received from [facility]   Hunger Vital Sign    Within the past 12 months, you worried that your food would run out before you got the money to buy more.: Never true    Within the past 12 months, the food you bought just didn't last and you didn't have money to get more.: Never true  Transportation Needs: No Transportation Needs (06/18/2024)   Received from [facility] - Transportation    In the past 12 months, has lack of transportation kept you from medical appointments or from getting medications?: No    Lack of Transportation (Non-Medical): No  Physical Activity: Inactive (02/06/2024)   Exercise Vital Sign    Days of Exercise per Week: 0 days    Minutes of Exercise per Session: 0 min  Stress: No Stress Concern  Present (02/06/2024)   [employer] of Occupational Health - Occupational Stress Questionnaire    Feeling of Stress : Not at all  Social Connections: Moderately Isolated (02/06/2024)   Social Connection and Isolation Panel    Frequency of Communication with Friends and Family: More than three times a week    Frequency of Social Gatherings with Friends and Family: More than three times a week    Attends Religious Services: 1 to 4 times per year    Active Member of [employer] or Organizations: No    Attends Banker Meetings:  Never    Marital Status: Divorced     Family History:  The patient's family history includes Heart failure in her father; Hypertension in her father; Kidney disease in her father. There is no history of Breast cancer.  ROS:   12-point review of systems is negative unless otherwise noted in the HPI.   EKGs/Labs/Other Studies Reviewed:    Studies reviewed were summarized above. The additional studies were reviewed today:  2D echo 01/30/2022: 1. Left ventricular ejection fraction, by estimation, is 40 to 45%. The  left ventricle has mildly decreased function. The left ventricle  demonstrates global hypokinesis. The left ventricular internal cavity size  was mildly to moderately dilated. Left  ventricular diastolic parameters are consistent with Grade II diastolic  dysfunction (pseudonormalization). Elevated left atrial pressure.   2. Right ventricular systolic function is normal. The right ventricular  size is normal. Tricuspid regurgitation signal is inadequate for assessing  PA pressure.   3. The mitral valve is normal in structure. Mild mitral valve  regurgitation. No evidence of mitral stenosis.   4. The aortic valve was not well visualized. Aortic valve regurgitation  is not visualized. No aortic stenosis is present. __________   Coronary CTA 03/01/2022: Noncardiac over read -  1. Near complete resolution of ground-glass opacities in the  chest when compared to the recent study from April of 2023. 2. Bandlike changes along the posterior pleural surface likely a mixture of atelectasis and post inflammatory changes also showing improvement since previous imaging. This may reflect developing post inflammatory fibrosis with and is mixed atelectatic changes at the lung bases, suggest attention on follow-up.   Cardiac overread -  Aorta:  Normal size.  No calcifications.  No dissection.   Aortic Valve:  Trileaflet.  No calcifications.   Coronary Arteries:  Normal coronary origin.  Right dominance.   RCA is a dominant artery that gives rise to PDA and PLA. There is no plaque.   Left main gives rise to LAD and LCX arteries. There is no LM disease.   LAD has no plaque.   LCX is a non-dominant artery that gives rise to a large obtuse marginal branch. There is no plaque.   Other findings:   Normal pulmonary vein drainage into the left atrium.   Normal left atrial appendage without a thrombus.   Normal size of the pulmonary artery.   IMPRESSION: 1. Normal coronary calcium  score of 0. Patient is low risk for coronary events. 2. Normal coronary origin with right dominance. 3. No evidence of CAD. 4. CAD-RADS 0. Consider non-atherosclerotic causes of chest pain. __________   Limited echo 06/21/2022: 1. Left ventricular ejection fraction, by estimation, is 35 to 40%. The  left ventricle has moderately decreased function. The left ventricle  demonstrates global hypokinesis. The left ventricular internal cavity size  was mildly dilated. Left ventricular  diastolic parameters are consistent with Grade I diastolic dysfunction  (impaired relaxation).   2. Right ventricular systolic function is normal. The right ventricular  size is normal. Tricuspid regurgitation signal is inadequate for assessing  PA pressure.   3. The mitral valve is normal in structure. Mild mitral valve  regurgitation. No evidence of mitral stenosis.    4. The aortic valve is normal in structure. Aortic valve regurgitation is  not visualized. No aortic stenosis is present.   5. The inferior vena cava is normal in size with greater than 50%  respiratory variability, suggesting right atrial pressure of 3 mmHg. __________   Cardiac MRI  07/18/2022: FINDINGS: 1. Mildly dilated left ventricular size. Moderately reduced LV systolic function (LVEF = 32%). There is global hypokinesis with no regional wall motion abnormalities.   There is no late gadolinium enhancement in the left ventricular myocardium.   LVEDV: 276 ml   LVESV: 187 ml   SV: 89 ml   CO: 4.9 L/min   Myocardial mass: 152   2. Normal right ventricular size, thickness and low normal systolic function (RVEF = 47%). There are no regional wall motion abnormalities.   3.  Normal left and right atrial size.   4. Normal size of the aortic root, ascending aorta and pulmonary artery. Normal pulmonic and aortic flow. Qp:Qs = 1.05   5.  Trace MR, Trace TR. no other significant valvular abnormalities.   6.  Normal pericardium.  No pericardial effusion.   IMPRESSION: 1.  Mildly dilated LV, moderately reduced LV function.  LVEF = 32% 2. No evidence for myocardial infiltration, late gadolinium enhancement or scar. 3.  No significant valvular abnormalities. 4.  Normal RV size and function 5.  Findings suggest non-ischemic cardiomyopathy. __________   Limited echo 11/02/2022: 1. Left ventricular ejection fraction, by estimation, is 40 to 45%. Left  ventricular ejection fraction by 3D volume is 43 %. The left ventricle has  mildly decreased function. The left ventricle demonstrates global  hypokinesis. The left ventricular  internal cavity size was mildly dilated. Left ventricular diastolic  parameters are indeterminate. The average left ventricular global  longitudinal strain is -8.5 %. The global longitudinal strain is abnormal.   2. Right ventricular systolic function is  normal. The right ventricular  size is normal.   3. Left atrial size was mildly dilated.   4. The mitral valve is normal in structure. Moderate mitral valve  regurgitation. No evidence of mitral stenosis.   5. The aortic valve is normal in structure. Aortic valve regurgitation is  not visualized. No aortic stenosis is present.   6. The inferior vena cava is normal in size with greater than 50%  respiratory variability, suggesting right atrial pressure of 3 mmHg.   Comparison(s): 06/21/2022-EF 35-40%.  __________   CPX 01/17/2023: Conclusion: The interpretation of this test is limited due to submaximal effort during the exercise. Based on available data, exercise testing with gas exchange demonstrates moderate functional impairment when compared to matched sedentary norms. Pre-exercise spirometry demonstrates moderate restrictive lung physiology, most likely related to body habitus. Patient appears limited due to deconditioning (due to previous medical conditions inhibiting physical activity) and body habitus with 13% improvement of PVO2 with corrections for ideal body weight. __________  2D echo 10/31/2023: 1. Left ventricular ejection fraction, by estimation, is 30 to 35%. The  left ventricle has moderately decreased function. The left ventricle has  no regional wall motion abnormalities. The left ventricular internal  cavity size was moderately dilated.  Left ventricular diastolic parameters are indeterminate. The average left  ventricular global longitudinal strain is -6.8 %. The global longitudinal  strain is abnormal.   2. Right ventricular systolic function is normal. The right ventricular  size is normal.   3. Left atrial size was moderately dilated.   4. The mitral valve is normal in structure. Moderate to severe mitral  valve regurgitation. No evidence of mitral stenosis.   5. The aortic valve has an indeterminant number of cusps. Aortic valve  regurgitation is not visualized. No  aortic stenosis is present.   6. The inferior vena cava is normal in size with greater than 50%  respiratory variability, suggesting right atrial pressure of 3 mmHg.     EKG:  EKG is ordered today.  The EKG ordered today demonstrates NSR, 70 bpm, left axis deviation, LVH, poor R wave progression along the precordial leads, nonspecific lateral ST-T changes  Recent Labs: 11/15/2023: B Natriuretic Peptide 578.2 11/18/2023: Hemoglobin 15.4; Platelets 360 12/27/2023: ALT 29; BUN 13; Creatinine, Ser 1.04; Potassium 4.6; Sodium 139  Recent Lipid Panel    Component Value Date/Time   CHOL 108 08/16/2023 1144   TRIG 97 08/16/2023 1144   HDL 48 08/16/2023 1144   CHOLHDL 2.3 08/16/2023 1144   LDLCALC 42 08/16/2023 1144    PHYSICAL EXAM:    VS:  BP 118/86 (BP Location: Left Arm, Patient Position: Sitting, Cuff Size: Large)   Pulse 70   Ht 6' (1.829 m)   Wt 271 lb (122.9 kg)   SpO2 94%   BMI 36.75 kg/m   BMI: Body mass index is 36.75 kg/m.  Physical Exam Vitals reviewed.  Constitutional:      Appearance: She is well-developed.  HENT:     Head: Normocephalic and atraumatic.  Eyes:     General:        Right eye: No discharge.        Left eye: No discharge.  Cardiovascular:     Rate and Rhythm: Normal rate and regular rhythm.     Heart sounds: Normal heart sounds, S1 normal and S2 normal. Heart sounds not distant. No midsystolic click and no opening snap. No murmur heard.    No friction rub.  Pulmonary:     Effort: Pulmonary effort is normal. No respiratory distress.     Breath sounds: Normal breath sounds. No decreased breath sounds, wheezing, rhonchi or rales.  Musculoskeletal:     Cervical back: Normal range of motion.  Skin:    General: Skin is warm and dry.     Nails: There is no clubbing.  Neurological:     Mental Status: She is alert and oriented to person, place, and time.  Psychiatric:        Speech: Speech normal.        Behavior: Behavior normal.        Thought  Content: Thought content normal.        Judgment: Judgment normal.     Wt Readings from Last 3 Encounters:  06/26/24 271 lb (122.9 kg)  02/06/24 267 lb (121.1 kg)  12/27/23 268 lb 6.4 oz (121.7 kg)     ASSESSMENT & PLAN:   HFrEF secondary to NICM: Euvolemic and well compensated with NYHA class III symptoms.  Continue current GDMT including carvedilol  25 mg twice daily, Jardiance , Entresto  97/103 mg twice daily, spironolactone  25 mg, and torsemide  20 mg.  Most recent echo demonstrated a persistent cardiomyopathy with an EF of 30 to 35%.  Cardiac MRI showed no evidence of LGE with findings suggestive of of nonischemic cardiomyopathy.  Relative hypotension precludes addition of BiDil.  Referred to EP for consideration of ICD.  Nonobstructive CAD: Coronary CTA showed a calcium  score of 0 with no evidence of CAD.  No symptoms suggestive of angina.  Continue aggressive risk factor modification and primary prevention including aspirin  81 mg and rosuvastatin  10 mg.  LDL 42.  HTN: Blood pressure is well-controlled in the office today.  Continue pharmacotherapy as outlined above.  Obesity: Heart healthy diet and recommended.  Remains on a GLP-1 therapy.  OSA: CPAP.     Disposition: F/u with Dr. Gollan or an  APP in 4 months.   Medication Adjustments/Labs and Tests Ordered: Current medicines are reviewed at length with the patient today.  Concerns regarding medicines are outlined above. Medication changes, Labs and Tests ordered today are summarized above and listed in the Patient Instructions accessible in Encounters.   Signed, Bernardino Bring, PA-C 06/26/2024 10:53 AM     [facility] [place] [address] [place], [place] [address] [phone]

## 2024-06-26 ENCOUNTER — Encounter: Payer: Self-pay | Admitting: Physician Assistant

## 2024-06-26 ENCOUNTER — Ambulatory Visit: Attending: Physician Assistant | Admitting: Physician Assistant

## 2024-06-26 ENCOUNTER — Ambulatory Visit
Admission: RE | Admit: 2024-06-26 | Discharge: 2024-06-26 | Disposition: A | Discharge status: Home / Self Care | Source: Ambulatory Visit | Attending: Neurology | Admitting: Neurology

## 2024-06-26 VITALS — BP 118/86 | HR 70 | Ht 72.0 in | Wt 271.0 lb

## 2024-06-26 DIAGNOSIS — I6782 Cerebral ischemia: Secondary | ICD-10-CM | POA: Diagnosis not present

## 2024-06-26 DIAGNOSIS — I2510 Atherosclerotic heart disease of native coronary artery without angina pectoris: Secondary | ICD-10-CM

## 2024-06-26 DIAGNOSIS — I10 Essential (primary) hypertension: Secondary | ICD-10-CM

## 2024-06-26 DIAGNOSIS — R519 Headache, unspecified: Secondary | ICD-10-CM | POA: Diagnosis not present

## 2024-06-26 DIAGNOSIS — I428 Other cardiomyopathies: Secondary | ICD-10-CM | POA: Diagnosis not present

## 2024-06-26 DIAGNOSIS — Z6838 Body mass index (BMI) 38.0-38.9, adult: Secondary | ICD-10-CM

## 2024-06-26 DIAGNOSIS — I5020 Unspecified systolic (congestive) heart failure: Secondary | ICD-10-CM

## 2024-06-26 DIAGNOSIS — G9389 Other specified disorders of brain: Secondary | ICD-10-CM | POA: Diagnosis not present

## 2024-06-26 DIAGNOSIS — E66812 Obesity, class 2: Secondary | ICD-10-CM

## 2024-06-26 DIAGNOSIS — G9600 Cerebrospinal fluid leak, unspecified: Secondary | ICD-10-CM | POA: Diagnosis not present

## 2024-06-26 DIAGNOSIS — G4733 Obstructive sleep apnea (adult) (pediatric): Secondary | ICD-10-CM | POA: Diagnosis not present

## 2024-06-26 MED ORDER — GADOBUTROL 1 MMOL/ML IV SOLN
10.0000 mL | Freq: Once | INTRAVENOUS | Status: AC | PRN
  Administered 2024-06-26: 10 mL via INTRAVENOUS

## 2024-06-26 MED ORDER — TORSEMIDE 20 MG PO TABS: ORAL_TABLET | ORAL | 6 refills | Status: AC

## 2024-06-26 NOTE — Patient Instructions (Signed)
 Medication Instructions:  Your physician recommends that you continue on your current medications as directed. Please refer to the Current Medication list given to you today.   *If you need a refill on your cardiac medications before your next appointment, please call your pharmacy*  Lab Work: None ordered at this time   Follow-Up: At [facility], you and your health needs are our priority.  As part of our continuing mission to provide you with exceptional heart care, our providers are all part of one team.  This team includes your primary Cardiologist (physician) and Advanced Practice Providers or APPs (Physician Assistants and Nurse Practitioners) who all work together to provide you with the care you need, when you need it.  Your next appointment:   4 month(s)  Provider:   You may see Timothy Gollan, MD or Bernardino Bring, PA-C

## 2024-07-03 ENCOUNTER — Ambulatory Visit: Attending: Cardiology | Admitting: Cardiology

## 2024-07-03 ENCOUNTER — Other Ambulatory Visit (HOSPITAL_COMMUNITY): Payer: Self-pay

## 2024-07-03 ENCOUNTER — Telehealth: Payer: Self-pay | Admitting: Pharmacist

## 2024-07-03 VITALS — BP 112/72 | HR 69 | Wt 275.1 lb

## 2024-07-03 DIAGNOSIS — Z982 Presence of cerebrospinal fluid drainage device: Secondary | ICD-10-CM | POA: Diagnosis not present

## 2024-07-03 DIAGNOSIS — Z794 Long term (current) use of insulin: Secondary | ICD-10-CM | POA: Diagnosis not present

## 2024-07-03 DIAGNOSIS — I340 Nonrheumatic mitral (valve) insufficiency: Secondary | ICD-10-CM | POA: Insufficient documentation

## 2024-07-03 DIAGNOSIS — G4733 Obstructive sleep apnea (adult) (pediatric): Secondary | ICD-10-CM | POA: Insufficient documentation

## 2024-07-03 DIAGNOSIS — R519 Headache, unspecified: Secondary | ICD-10-CM | POA: Insufficient documentation

## 2024-07-03 DIAGNOSIS — I428 Other cardiomyopathies: Secondary | ICD-10-CM | POA: Insufficient documentation

## 2024-07-03 DIAGNOSIS — Z79899 Other long term (current) drug therapy: Secondary | ICD-10-CM | POA: Diagnosis not present

## 2024-07-03 DIAGNOSIS — Z7984 Long term (current) use of oral hypoglycemic drugs: Secondary | ICD-10-CM | POA: Insufficient documentation

## 2024-07-03 DIAGNOSIS — E119 Type 2 diabetes mellitus without complications: Secondary | ICD-10-CM | POA: Insufficient documentation

## 2024-07-03 DIAGNOSIS — E781 Pure hyperglyceridemia: Secondary | ICD-10-CM | POA: Diagnosis not present

## 2024-07-03 DIAGNOSIS — I5022 Chronic systolic (congestive) heart failure: Secondary | ICD-10-CM | POA: Insufficient documentation

## 2024-07-03 DIAGNOSIS — I5020 Unspecified systolic (congestive) heart failure: Secondary | ICD-10-CM | POA: Diagnosis not present

## 2024-07-03 DIAGNOSIS — E669 Obesity, unspecified: Secondary | ICD-10-CM | POA: Diagnosis not present

## 2024-07-03 DIAGNOSIS — Z7985 Long-term (current) use of injectable non-insulin antidiabetic drugs: Secondary | ICD-10-CM | POA: Diagnosis not present

## 2024-07-03 MED ORDER — VERICIGUAT 2.5 MG PO TABS: 2.5000 mg | ORAL_TABLET | Freq: Every day | ORAL | 5 refills | Status: DC

## 2024-07-03 NOTE — Progress Notes (Signed)
 PCP: Melvin Pao, NP Cardiology: Dr. Perla HF Cardiology: Dr. Rolan  Chief complaint: CHF  63 y.o. with history of OSA, type 2 diabetes, CSF leak requiring VP shunt, and nonischemic cardiomyopathy was referred by Dr. Gollan for CHF clinic evaluation.  Patient's cardiomyopathy was diagnosed in 2018 in [place], cath at that time showed no significant CAD.  She subsequently relocated to [facility].  She was admitted to [facility] in 4/23 with PNA and CHF, echo showed EF 40-45%.  Coronary CTA done in 5/23 showed no CAD, calcium  score 0.  Cardiac MRI in 10/23 showed EF 32%, no LGE, RV EF 47%.  Echo in 1/24 showed EF 40-45%, mild LV dilation, normal RV, moderate MR, IVC normal.  Patient does not have a strong family history of cardiomyopathy, her father had CHF but developed after he had renal failure requiring HD.  No drinking or drugs.    CPX in 4/24 showed submaximal study with moderate lung restriction, no significant HF limitation, primarily limited by obesity/deconditioning.   Echo in 1/25 showed EF 30-35%, moderate LV dilation, normal RV size/systolic function, moderate-severe MR.   Patient returns for followup of CHF.  Weight is down 1 lb.  SBP generally in 120s when she checks at home.  She has chronic weakness in her left leg.  She is short of breath walking up stairs, no dyspnea walking on flat ground generally.  Short of breath with moderate activity.  No lightheadedness with standing.  No chest pain.  She has chronic headaches and recently had a head MRI, to followup with neurology. She has not been able to tolerate CPAP. She is frustrated because she has not lost much weight with Ozempic .   Labs (10/23): LDL 91, K 4.4, creatinine 9.06 Labs (2/24): LDL 41, BNP 38, K 3.7, creatinine 1.11 Labs (5/24): K 3.8, creatinine 1.12 Labs (11/24): LDL 42 Labs (3/25): K 4.6, creatinine 1.04  ECG (9/25, personally reviewed): NSR, LVH with repolarization abnormality.   PMH: 1. OSA: Does not tolerate CPAP.  2.  Type 2 diabetes 3. CSF leak: Repairs in 2014 and 2017, VP shunt placed.  4. H/o spinal fusion 5. Chronic systolic CHF: nonischemic cardiomyopathy.  Diagnosed in [place] around 2018.  - Echo (2018): EF 45%.  - LHC (2018): No significant CAD.  - Echo (8/19): EF 45% - Echo (7/22): EF 55% - Echo (4/23): EF 40-45%, normal RV.  - Coronary CTA (5/23): Coronary artery calcium  score 0, no CAD.  - Echo (9/23): EF 35-40%, normal RV - cMRI (10/23): EF 32%, no LGE, RV EF 47%.  - Echo (1/24): EF 40-45%, mild LV dilation, normal RV, moderate MR, IVC normal.  - CPX (4/24): Submaximal with moderate lung restriction in setting of obesity, no significant HF limitation but she has a moderate functional limitation due to obesity/deconditioning.  - Echo (1/25): EF 30-35%, moderate LV dilation, normal RV size/systolic function, moderate-severe MR.  6. ABIs (8/24) normal  SH: From [place], former [employer] fund[employer] now on disability.  No smoking, ETOH, drugs.   FH: Father with ESRD and CHF, siblings with no cardiac issues.   ROS: All systems reviewed and negative except as per HPI.   Current Outpatient Medications  Medication Sig Dispense Refill   acetaminophen  (TYLENOL ) 500 MG tablet Take 500 mg by mouth every 6 (six) hours as needed.     aspirin  81 MG chewable tablet Chew 1 tablet (81 mg total) by mouth daily. 90 tablet 3   carvedilol  (COREG ) 25 MG tablet Take 1 tablet (25  mg total) by mouth 2 (two) times daily. 180 tablet 3   citalopram  (CELEXA ) 20 MG tablet Take 1 tablet (20 mg total) by mouth daily. 90 tablet 1   empagliflozin  (JARDIANCE ) 25 MG TABS tablet Take 1 tablet (25 mg total) by mouth daily before breakfast. To replace 10mg - place on hold until patient requests please 90 tablet 0   gabapentin  (NEURONTIN ) 300 MG capsule TAKE 1 CAPSULE(300 MG) BY MOUTH THREE TIMES DAILY 270 capsule 1   insulin  glargine (LANTUS  SOLOSTAR) 100 UNIT/ML Solostar Pen Inject 30 Units into the skin daily. 15 mL 1   metFORMIN  (GLUCOPHAGE ) 1000 MG  tablet TAKE 1 TABLET(1000 MG) BY MOUTH TWICE DAILY WITH A MEAL 180 tablet 0   omeprazole  (PRILOSEC) 20 MG capsule TAKE 1 CAPSULE(20 MG) BY MOUTH DAILY 90 capsule 0   rosuvastatin  (CRESTOR ) 10 MG tablet TAKE 1 TABLET(10 MG) BY MOUTH DAILY 90 tablet 0   sacubitril -valsartan  (ENTRESTO ) 97-103 MG Take 1 tablet by mouth 2 (two) times daily. 180 tablet 0   Semaglutide ,0.25 or 0.5MG /DOS, (OZEMPIC , 0.25 OR 0.5 MG/DOSE,) 2 MG/1.5ML SOPN Inject 0.5 mg into the skin once a week. 4 mL 0   spironolactone  (ALDACTONE ) 25 MG tablet TAKE 1 TABLET(25 MG) BY MOUTH DAILY 90 tablet 1   torsemide  (DEMADEX ) 20 MG tablet Take 2 tablets (40 mg total) by mouth daily for 3 days, THEN 1 tablet (20 mg total) daily. (Patient taking differently: Take 1 tablet (20 mg total) daily.) 36 tablet 6   Vericiguat  2.5 MG TABS Take 1 tablet (2.5 mg total) by mouth daily. 30 tablet 5   No current facility-administered medications for this visit.   BP 112/72   Pulse 69   Wt 275 lb 2 oz (124.8 kg)   SpO2 100%   BMI 37.31 kg/m  General: NAD Neck: No JVD, no thyromegaly or thyroid nodule.  Lungs: Clear to auscultation bilaterally with normal respiratory effort. CV: Nondisplaced PMI.  Heart regular S1/S2, no S3/S4, no murmur.  No peripheral edema.  No carotid bruit.  Normal pedal pulses.  Abdomen: Soft, nontender, no hepatosplenomegaly, no distention.  Skin: Intact without lesions or rashes.  Neurologic: Alert and oriented x 3.  Psych: Normal affect. Extremities: No clubbing or cyanosis.  HEENT: Normal.   Assessment/Plan: 1. Chronic systolic CHF: Nonischemic cardiomyopathy.  Cath in 2018 and coronary CTA in 5/23 showed no coronary disease.  Cardiac MRI in 10/23 showed  EF 32%, no LGE, RV EF 47%, and echo in 1/24 showed EF 40-45%, mild LV dilation, normal RV, moderate MR, IVC normal.  She does not have a history of ETOH or drug abuse.  She does not have a strong family history of cardiomyopathy.  CPX in 4/24 suggests that her  predominant limitation is obesity/deconditioning. Most recent echo in 1/25 showed EF 30-35%, moderate LV dilation, normal RV size/systolic function, moderate-severe MR.  She does not appear significantly volume overloaded on exam but has NYHA class III symptoms.   - Continue torsemide  20 mg daily, BMET/BNP today.  - Continue Coreg  25 mg bid.  - Continue empagliflozin  10 daily - Continue Entresto  97/103 bid - Continue spironolactone  25 daily.  - Would not add Bidil given chronic headaches (currently being worked up by neurology).  - She will start vericiguat  2.5 mg daily.   - With persistently low EF, she has been referred to EP for ICD.  With class III symptoms and EF 30-35% on maximal medical management, I would also like her considered for cardiac contractility  modulator.  She can discuss this with EP when she has her appt.  2. Left leg pain/weakness: With ambulation it is worse.  ABIs normal.  I think that this may be sciatica.  - Followup with PCP for evaluation.   3. OSA:  Has not been able to tolerate CPAP.  4. Obesity: Frustrated with lack of weight loss with semaglutide .  - I will see if we can transition her to tirzepatide.  5. Mitral regurgitation: Moderate-severe MR on 1/25 echo.  This is worse than the past.  Suspect functional MR.  - I will arrange for repeat echo to reassess MR, if it looks like it could be severe, will arrange for TEE.  Followup in 1 month.   I spent 32 minutes reviewing records, interviewing/examining patient, and managing orders.   Ezra Shuck 07/03/2024

## 2024-07-03 NOTE — Patient Instructions (Signed)
 Medication Changes:  SWITCH Ozempic  to Mounjaro  START Vericiguat  2.5mg  daily  Lab Work:  Go downstairs to [place] on LOWER LEVEL to have your blood work completed.  We will only call you if the results are abnormal or if the provider would like to make medication changes.  No news is good news.     Testing/Procedures:  Your physician has requested that you have an echocardiogram. Echocardiography is a painless test that uses sound waves to create images of your heart. It provides your doctor with information about the size and shape of your heart and how well your heart's chambers and valves are working. This procedure takes approximately one hour. There are no restrictions for this procedure. Please do NOT wear cologne, perfume, aftershave, or lotions (deodorant is allowed). Please arrive 15 minutes prior to your appointment time.  Please note: We ask at that you not bring children with you during ultrasound (echo/ vascular) testing. Due to room size and safety concerns, children are not allowed in the ultrasound rooms during exams. Our front office staff cannot provide observation of children in our lobby area while testing is being conducted. An adult accompanying a patient to their appointment will only be allowed in the ultrasound room at the discretion of the ultrasound technician under special circumstances. We apologize for any inconvenience.  SOMEONE WILL CONTACT YOU IN ORDER TO SCHEDULE YOUR APPOINTMENT.  Referrals:  You have been referred to [facility]. They will contact you in order to schedule your appointment.   Follow-Up in: Please follow up with the [facility] in 1 month with Dr. Rolan.    Thank you for choosing [facility].    At the [facility], you and your health needs are our priority. We have a designated team specialized in the treatment of Heart Failure. This Care Team  includes your primary Heart Failure Specialized Cardiologist (physician), Advanced Practice Providers (APPs- Physician Assistants and Nurse Practitioners), and Pharmacist who all work together to provide you with the care you need, when you need it.   You may see any of the following providers on your designated Care Team at your next follow up:  Dr. Toribio Fuel Dr. Ezra Rolan Dr. Ria Commander Dr. Morene Brownie Ellouise Class, FNP Jaun Bash, RPH-CPP  Please be sure to bring in all your medications bottles to every appointment.   Need to Contact Us :  If you have any questions or concerns before your next appointment please send us  a message through [place] or call our office at [phone].    TO LEAVE A MESSAGE FOR THE NURSE SELECT OPTION 2, PLEASE LEAVE A MESSAGE INCLUDING: YOUR NAME DATE OF BIRTH CALL BACK NUMBER REASON FOR CALL**this is important as we prioritize the call backs  YOU WILL RECEIVE A CALL BACK THE SAME DAY AS LONG AS YOU CALL BEFORE 4:00 PM

## 2024-07-03 NOTE — Telephone Encounter (Signed)
 Prior authorizations for Mounjaro (Key: [id number]) and Verquvo  (Key: [id number]) submitted via covermymeds. Awaiting response.

## 2024-07-05 ENCOUNTER — Ambulatory Visit (HOSPITAL_COMMUNITY): Payer: Self-pay | Admitting: Cardiology

## 2024-07-05 LAB — LIPID PANEL
Chol/HDL Ratio: 2.3 ratio (ref 0.0–4.4)
Cholesterol, Total: 127 mg/dL (ref 100–199)
HDL: 55 mg/dL (ref >39)
LDL Chol Calc (NIH): 54 mg/dL (ref 0–99)
Triglycerides: 97 mg/dL (ref 0–149)
VLDL Cholesterol Cal: 18 mg/dL (ref 5–40)

## 2024-07-05 LAB — BASIC METABOLIC PANEL WITH GFR
BUN/Creatinine Ratio: 9 — ABNORMAL LOW (ref 12–28)
BUN: 9 mg/dL (ref 8–27)
CO2: 24 mmol/L (ref 20–29)
Calcium: 9.5 mg/dL (ref 8.7–10.3)
Chloride: 96 mmol/L (ref 96–106)
Creatinine, Ser: 1.01 mg/dL — ABNORMAL HIGH (ref 0.57–1.00)
Glucose: 302 mg/dL — ABNORMAL HIGH (ref 70–99)
Potassium: 4.7 mmol/L (ref 3.5–5.2)
Sodium: 137 mmol/L (ref 134–144)
eGFR: 63 mL/min/1.73 (ref >59)

## 2024-07-05 LAB — BRAIN NATRIURETIC PEPTIDE: BNP: 17.4 pg/mL (ref 0.0–100.0)

## 2024-07-06 ENCOUNTER — Other Ambulatory Visit (HOSPITAL_COMMUNITY): Payer: Self-pay

## 2024-07-06 NOTE — Telephone Encounter (Signed)
 Ver[employer]  PA denied, appeal faxed.

## 2024-07-08 ENCOUNTER — Encounter: Payer: Self-pay | Admitting: Nurse Practitioner

## 2024-07-08 ENCOUNTER — Ambulatory Visit (INDEPENDENT_AMBULATORY_CARE_PROVIDER_SITE_OTHER): Admitting: Nurse Practitioner

## 2024-07-08 VITALS — BP 118/74 | HR 73 | Temp 98.7°F | Ht 72.0 in | Wt 275.4 lb

## 2024-07-08 DIAGNOSIS — E1169 Type 2 diabetes mellitus with other specified complication: Secondary | ICD-10-CM | POA: Diagnosis not present

## 2024-07-08 DIAGNOSIS — I5020 Unspecified systolic (congestive) heart failure: Secondary | ICD-10-CM | POA: Diagnosis not present

## 2024-07-08 DIAGNOSIS — Z124 Encounter for screening for malignant neoplasm of cervix: Secondary | ICD-10-CM

## 2024-07-08 DIAGNOSIS — L309 Dermatitis, unspecified: Secondary | ICD-10-CM | POA: Diagnosis not present

## 2024-07-08 DIAGNOSIS — I10 Essential (primary) hypertension: Secondary | ICD-10-CM

## 2024-07-08 DIAGNOSIS — F419 Anxiety disorder, unspecified: Secondary | ICD-10-CM

## 2024-07-08 DIAGNOSIS — Z794 Long term (current) use of insulin: Secondary | ICD-10-CM

## 2024-07-08 DIAGNOSIS — F339 Major depressive disorder, recurrent, unspecified: Secondary | ICD-10-CM

## 2024-07-08 MED ORDER — TRIAMCINOLONE ACETONIDE 0.1 % EX CREA: 1.0000 | TOPICAL_CREAM | Freq: Two times a day (BID) | CUTANEOUS | 1 refills | Status: AC

## 2024-07-08 MED ORDER — EMPAGLIFLOZIN 25 MG PO TABS: 25.0000 mg | ORAL_TABLET | Freq: Every day | ORAL | 1 refills | Status: DC

## 2024-07-08 NOTE — Assessment & Plan Note (Signed)
 Recommended eating smaller high protein, low fat meals more frequently and exercising 30 mins a day 5 times a week with a goal of 10-15lb weight loss in the next 3 months.

## 2024-07-08 NOTE — Assessment & Plan Note (Signed)
Chronic.  Controlled.  Continue with current medication regimen of Carvedilol and Valsartan (Entresto).  Labs ordered today.  Return to clinic in 3 months for reevaluation.  Call sooner if concerns arise.   

## 2024-07-08 NOTE — Assessment & Plan Note (Signed)
 Chronic.  Ongoing. On Ozempic , Metformin  and Jardiance  and Lantus  30u.  Cardiologist wants to change patient to Mounjaro .  She is working with their pharmacist.  Doing well with current regimen.  Sugars are running <130.  However, checked last week and was >300.  Labs ordered today. Will make recommendations based on results. Follow up in 3 months.  Call sooner if concerns arise.

## 2024-07-08 NOTE — Progress Notes (Signed)
 BP 118/74   Pulse 73   Temp 98.7 F (37.1 C) (Oral)   Ht 6' (1.829 m)   Wt 275 lb 6.4 oz (124.9 kg)   SpO2 97%   BMI 37.35 kg/m    Subjective:    Patient ID: Emily Hinton, female    DOB: May 26, 1961, 63 y.o.   MRN: [id number]  HPI: Emily Coltrane- Hinton is a 63 y.o. female  Chief Complaint  Patient presents with   Anxiety   Depression   Diabetes    No recent eye exam per patient- requesting referral to Optometry for this   Hyperlipidemia   Hypertension   Referral    Patient states she would like a GYN referral to have a yearly check up, no concerns    HYPERTENSION / HYPERLIPIDEMIA/HF Has been following up with Cardiology and HF clinic.  Working on weight loss. Has been out of Jardiance  and needs a refill.  She is weighing daily.  States sometimes she goes down 5lbs then goes right back up. Satisfied with current treatment? yes Duration of hypertension: years BP monitoring frequency: daily BP range: <120/70 BP medication side effects: no Past BP meds: carvedilol  and valsartan  Duration of hyperlipidemia: years Cholesterol medication side effects: no Cholesterol supplements: none Past cholesterol medications: rosuvastatin  (crestor ) Medication compliance: excellent compliance Aspirin : no Recent stressors: no Recurrent headaches: yes Visual changes: no Palpitations: no Dyspnea: yes- but improving Chest pain: no Lower extremity edema: yes- but improving Dizzy/lightheaded: no  DIABETES Patient 's cardiologist is switching her from Ozempic  to Mounjaro .  She is working with the pharmacist to get that taken care of.  Seen in Cardiology last week and glucose was >300. Hypoglycemic episodes:no Polydipsia/polyuria: no Visual disturbance: no Chest pain: no Paresthesias: no Glucose Monitoring: yes  Accucheck frequency: Daily  Fasting glucose: 130  Post prandial:  Evening:  Before meals: Taking Insulin ?: yes  Long acting insulin : Lantus   30u  Short acting insulin :  Blood Pressure Monitoring: daily Retinal Examination: Up to Date Foot Exam: up to date Diabetic Education: Not Completed Pneumovax: Up to Date Influenza: Up to Date Aspirin : no  VP SHUNT Patient was having sharp pains in her head and was seen by Neurology and waiting for test results.   DEPRESSION/ANXIETY Patient states he mood has been better.  She is taking citalopram  20mg .  She does still have anxiety with driving.  Her sons still cause her a lot of stress. She does have a lot of fatigue and that gets her down.  She feels like this dose of citalopram  is working well for her.   Patient states she has a rash on both feet.       Relevant past medical, surgical, family and social history reviewed and updated as indicated. Interim medical history since our last visit reviewed. Allergies and medications reviewed and updated.  Review of Systems  Eyes:  Negative for visual disturbance.  Respiratory:  Negative for cough, chest tightness and shortness of breath.   Cardiovascular:  Negative for chest pain, palpitations and leg swelling.  Endocrine: Negative for polydipsia and polyuria.  Musculoskeletal:        Left leg pain  Skin:  Positive for pallor.  Neurological:  Negative for dizziness, numbness and headaches.  Psychiatric/Behavioral:  Positive for dysphoric mood. Negative for suicidal ideas. The patient is nervous/anxious.     Per HPI unless specifically indicated above     Objective:    BP 118/74   Pulse 73   Temp 98.7 F (  37.1 C) (Oral)   Ht 6' (1.829 m)   Wt 275 lb 6.4 oz (124.9 kg)   SpO2 97%   BMI 37.35 kg/m   Wt Readings from Last 3 Encounters:  07/08/24 275 lb 6.4 oz (124.9 kg)  07/03/24 275 lb 2 oz (124.8 kg)  06/26/24 271 lb (122.9 kg)    Physical Exam Vitals and nursing note reviewed.  Constitutional:      General: She is not in acute distress.    Appearance: Normal appearance. She is obese. She is not ill-appearing,  toxic-appearing or diaphoretic.  HENT:     Head: Normocephalic.     Right Ear: External ear normal.     Left Ear: External ear normal.     Nose: Nose normal.     Mouth/Throat:     Mouth: Mucous membranes are moist.     Pharynx: Oropharynx is clear.  Eyes:     General:        Right eye: No discharge.        Left eye: No discharge.     Extraocular Movements: Extraocular movements intact.     Conjunctiva/sclera: Conjunctivae normal.     Pupils: Pupils are equal, round, and reactive to light.  Cardiovascular:     Rate and Rhythm: Normal rate and regular rhythm.     Heart sounds: No murmur heard. Pulmonary:     Effort: Pulmonary effort is normal. No respiratory distress.     Breath sounds: Normal breath sounds. No wheezing or rales.  Musculoskeletal:     Cervical back: Normal range of motion and neck supple.     Right lower leg: No edema.     Left lower leg: No edema.  Skin:    General: Skin is warm and dry.     Capillary Refill: Capillary refill takes less than 2 seconds.     Findings: Rash present. Rash is papular.     Comments: On bilateral feet  Neurological:     General: No focal deficit present.     Mental Status: She is alert and oriented to person, place, and time. Mental status is at baseline.  Psychiatric:        Mood and Affect: Mood normal.        Behavior: Behavior normal.        Thought Content: Thought content normal.        Judgment: Judgment normal.     Results for orders placed or performed in visit on 07/03/24  Basic metabolic panel with GFR   Collection Time: 07/03/24 10:14 AM  Result Value Ref Range   Glucose 302 (H) 70 - 99 mg/dL   BUN 9 8 - 27 mg/dL   Creatinine, Ser 8.98 (H) 0.57 - 1.00 mg/dL   eGFR 63 >40 fO/fpw/8.26   BUN/Creatinine Ratio 9 (L) 12 - 28   Sodium 137 134 - 144 mmol/L   Potassium 4.7 3.5 - 5.2 mmol/L   Chloride 96 96 - 106 mmol/L   CO2 24 20 - 29 mmol/L   Calcium  9.5 8.7 - 10.3 mg/dL  Brain natriuretic peptide   Collection  Time: 07/03/24 10:14 AM  Result Value Ref Range   BNP 17.4 0.0 - 100.0 pg/mL  Lipid panel   Collection Time: 07/03/24 10:14 AM  Result Value Ref Range   Cholesterol, Total 127 100 - 199 mg/dL   Triglycerides 97 0 - 149 mg/dL   HDL 55 >60 mg/dL   VLDL Cholesterol Cal 18 5 - 40 mg/dL  LDL Chol Calc (NIH) 54 0 - 99 mg/dL   Chol/HDL Ratio 2.3 0.0 - 4.4 ratio      Assessment & Plan:   Problem List Items Addressed This Visit       Cardiovascular and Mediastinum   Essential hypertension   Chronic.  Controlled.  Continue with current medication regimen of Carvedilol  and Valsartan  (Entresto ).  Labs ordered today.  Return to clinic in 3 months for reevaluation.  Call sooner if concerns arise.       HFrEF (heart failure with reduced ejection fraction) (HCC) - Primary   Chronic.  Improved.  Has not followed up with Cardiology.  Does weigh daily- 275lb.  Recommend patient make an appointment and attend the appointment with cardiology.   Per patient breathing has improved and swelling have improved with Torsemide . Follow up in 3 months.  Call sooner if concerns arise. Will help patient make appointment with Cardiology.   - Reminded to call for an overnight weight gain of >2 pounds or a weekly weight gain of >5 pounds - not adding salt to food and read food labels. Reviewed the importance of keeping daily sodium intake to 2000mg  daily. - Avoid Ibuprofen products.      Relevant Orders   Comprehensive metabolic panel with GFR   CBC With Diff/Platelet     Endocrine   Diabetes mellitus (HCC)   Chronic.  Ongoing. On Ozempic , Metformin  and Jardiance  and Lantus  30u.  Cardiologist wants to change patient to Mounjaro .  She is working with their pharmacist.  Doing well with current regimen.  Sugars are running <130.  However, checked last week and was >300.  Labs ordered today. Will make recommendations based on results. Follow up in 3 months.  Call sooner if concerns arise.       Relevant  Medications   empagliflozin  (JARDIANCE ) 25 MG TABS tablet   Other Relevant Orders   Hemoglobin A1c     Other   Morbid obesity (HCC)   Recommended eating smaller high protein, low fat meals more frequently and exercising 30 mins a day 5 times a week with a goal of 10-15lb weight loss in the next 3 months.       Relevant Medications   empagliflozin  (JARDIANCE ) 25 MG TABS tablet   Depression, recurrent   Chronic. Improved.  Continue with Celexa  20mg .  Side effects and benefits of medication discussed during visit.  Follow up in 6 months.  Call sooner if concerns arise.       Anxiety   Chronic. Improved.  Continue with Celexa  20mg .  Side effects and benefits of medication discussed during visit.  Follow up in 6 months.  Call sooner if concerns arise.       Other Visit Diagnoses       Eczema, unspecified type       Will treat with Triamconalone cream.  Complete course of medication.  Follow up if not improved.           Follow up plan: Return in about 3 months (around 10/07/2024) for HTN, HLD, DM2 FU.

## 2024-07-08 NOTE — Assessment & Plan Note (Signed)
 Chronic. Improved.  Continue with Celexa  20mg .  Side effects and benefits of medication discussed during visit.  Follow up in 6 months.  Call sooner if concerns arise.

## 2024-07-08 NOTE — Assessment & Plan Note (Signed)
 Chronic.  Improved.  Has not followed up with Cardiology.  Does weigh daily- 275lb.  Recommend patient make an appointment and attend the appointment with cardiology.   Per patient breathing has improved and swelling have improved with Torsemide . Follow up in 3 months.  Call sooner if concerns arise. Will help patient make appointment with Cardiology.   - Reminded to call for an overnight weight gain of >2 pounds or a weekly weight gain of >5 pounds - not adding salt to food and read food labels. Reviewed the importance of keeping daily sodium intake to 2000mg  daily. - Avoid Ibuprofen products.

## 2024-07-09 ENCOUNTER — Other Ambulatory Visit (HOSPITAL_COMMUNITY): Payer: Self-pay

## 2024-07-09 LAB — COMPREHENSIVE METABOLIC PANEL WITH GFR
ALT: 18 IU/L (ref 0–32)
AST: 15 IU/L (ref 0–40)
Albumin: 4.6 g/dL (ref 3.9–4.9)
Alkaline Phosphatase: 125 IU/L (ref 49–135)
BUN/Creatinine Ratio: 10 — ABNORMAL LOW (ref 12–28)
BUN: 10 mg/dL (ref 8–27)
Bilirubin Total: 0.3 mg/dL (ref 0.0–1.2)
CO2: 25 mmol/L (ref 20–29)
Calcium: 9.8 mg/dL (ref 8.7–10.3)
Chloride: 94 mmol/L — ABNORMAL LOW (ref 96–106)
Creatinine, Ser: 1.02 mg/dL — ABNORMAL HIGH (ref 0.57–1.00)
Globulin, Total: 3.4 g/dL (ref 1.5–4.5)
Glucose: 320 mg/dL — ABNORMAL HIGH (ref 70–99)
Potassium: 5.2 mmol/L (ref 3.5–5.2)
Sodium: 136 mmol/L (ref 134–144)
Total Protein: 8 g/dL (ref 6.0–8.5)
eGFR: 63 mL/min/1.73 (ref >59)

## 2024-07-09 LAB — HEMOGLOBIN A1C
Est. average glucose Bld gHb Est-mCnc: 395 mg/dL
Hgb A1c MFr Bld: 15.4 % — ABNORMAL HIGH (ref 4.8–5.6)

## 2024-07-09 LAB — CBC WITH DIFF/PLATELET
Basophils Absolute: 0 x10E3/uL (ref 0.0–0.2)
Basos: 0 %
EOS (ABSOLUTE): 0.1 x10E3/uL (ref 0.0–0.4)
Eos: 2 %
Hematocrit: 45.2 % (ref 34.0–46.6)
Hemoglobin: 14.5 g/dL (ref 11.1–15.9)
Immature Grans (Abs): 0 x10E3/uL (ref 0.0–0.1)
Immature Granulocytes: 0 %
Lymphocytes Absolute: 1.9 x10E3/uL (ref 0.7–3.1)
Lymphs: 30 %
MCH: 29.7 pg (ref 26.6–33.0)
MCHC: 32.1 g/dL (ref 31.5–35.7)
MCV: 93 fL (ref 79–97)
Monocytes Absolute: 0.5 x10E3/uL (ref 0.1–0.9)
Monocytes: 8 %
Neutrophils Absolute: 3.7 x10E3/uL (ref 1.4–7.0)
Neutrophils: 60 %
Platelets: 331 x10E3/uL (ref 150–450)
RBC: 4.88 x10E6/uL (ref 3.77–5.28)
RDW: 12.5 % (ref 11.7–15.4)
WBC: 6.3 x10E3/uL (ref 3.4–10.8)

## 2024-07-09 MED ORDER — MOUNJARO 2.5 MG/0.5ML ~~LOC~~ SOAJ
2.5000 mg | SUBCUTANEOUS | 0 refills | Status: DC
Start: 2024-07-09 — End: 2024-08-12

## 2024-07-09 MED ORDER — VERICIGUAT 2.5 MG PO TABS: 2.5000 mg | ORAL_TABLET | Freq: Every day | ORAL | 5 refills | Status: DC

## 2024-07-09 NOTE — Addendum Note (Signed)
 Addended by: DELSIE JAUN RAMAN on: 07/09/2024 01:36 PM   Modules accepted: Orders

## 2024-07-09 NOTE — Telephone Encounter (Signed)
 [employer]  appeal approved. Mounjaro  approved. Copay $47.

## 2024-07-10 ENCOUNTER — Ambulatory Visit: Payer: Self-pay | Admitting: Nurse Practitioner

## 2024-07-16 ENCOUNTER — Telehealth: Payer: Self-pay

## 2024-07-16 DIAGNOSIS — E1169 Type 2 diabetes mellitus with other specified complication: Secondary | ICD-10-CM

## 2024-07-16 NOTE — Progress Notes (Unsigned)
   07/16/2024  Patient ID: Arlyne Cone- Rogena, female   DOB: Jun 25, 1961, 63 y.o.   MRN: [id number]  Subjective/Objective: Patient outreach to follow-up on management of diabetes  Diabetes Management Plan -Current medications:metformin  1000mg  BID, Mounjaro  2.5mg  weekly, Lantus  40 untis daily, Jardiance  25mg  daily -Patient recently switched from Ozempic  to Mounjaro - she has had 1 dose of the 2.5mg  and endorses tolerating well so far -Patient is down to 4 days of Jardiance  25mg , and copay for refill at pharmacy is almost $400 -A1c recently increased drastically to 15.4% from 6.5%; patient does not endorse being out of any diabetes medications recently -Does endorse s/sx of hyperglycemia, including lethargy, stomach upset, thirst, and increased urination -Patient does monitor home BG and states FBG this morning was 136.  She states this is consistent or even a little higher than other recent readings.  Assessment/Plan:  Diabetes Management Plan -Likely that patient would qualify for [employer] for Jardiance  (based on cardiomyopathy).  I will work on enrollment and provide information to pharmacy to assist with copay. -Based on A1c and symptoms of hyperglycemia, I recommend urinalysis to check for ketones in urine; if present, hold Jardiance  until A1c <9% and no ketones present to prevent DKA -Continue Lantus  40 units and metformin  1000mg  BID -Complete 4 weeks of Mounjaro  2.5mg  weekly, then consider increasing to 5mg  weekly if patient continues to tolerate well -Unsure of accuracy of home BG readings based on reported FBG versus recent A1c- will discuss further with patient to assess if new glucometer and testing supplies may be needed  Follow-up: 4 weeks  Channing DELENA Mealing, PharmD, DPLA

## 2024-07-17 ENCOUNTER — Telehealth: Payer: Self-pay

## 2024-07-17 DIAGNOSIS — E1169 Type 2 diabetes mellitus with other specified complication: Secondary | ICD-10-CM

## 2024-07-17 NOTE — Progress Notes (Signed)
   07/17/2024  Patient ID: Emily Hinton, female   DOB: 12-Sep-1961, 63 y.o.   MRN: [id number]  Patient is already enrolled in [facility] for cardiomyopathy diagnosis that will cover copay for Jardiance  at the pharmacy.  Made patient aware of this, but that a urinalysis needs to be done prior to continuing Jardiance  25mg  daily at this time.  Patient plans to come to [facility] Monday or Tuesday to have this completed and has a follow-up with PCP on 10/10.  If ketones are present in the urine, I recommend holding Jardiance  until A1c improves and no ketones are present to prevent risk of DKA.  Patient states home glucometer is less than 1 yr old, so it should be accurate.  FBG this morning was >200, and patient does verify she has increased Lantus  to 40 units daily and is taking Mounjaro  2.5mg  weekly and metformin  1000mg  BID as prescribed.  Goal to further increase Mounjaro  as able.  Channing DELENA Mealing, PharmD, DPLA

## 2024-07-17 NOTE — Progress Notes (Signed)
 Emily Hinton                                          MRN: [id number]   07/17/2024   The [facility] Quality Team Specialist reviewed this patient medical record for the purposes of chart review for care gap closure. The following were reviewed: chart review for care gap closure-diabetic eye exam.    [facility] Quality Team

## 2024-07-20 NOTE — Addendum Note (Signed)
 Addended by: MELVIN PAO on: 07/20/2024 08:09 AM   Modules accepted: Orders

## 2024-07-21 ENCOUNTER — Encounter: Payer: Self-pay | Admitting: Nurse Practitioner

## 2024-07-21 ENCOUNTER — Ambulatory Visit: Admitting: Nurse Practitioner

## 2024-07-21 ENCOUNTER — Ambulatory Visit: Payer: Self-pay | Admitting: Nurse Practitioner

## 2024-07-21 VITALS — BP 105/76 | HR 90 | Temp 97.9°F | Ht 72.0 in | Wt 273.4 lb

## 2024-07-21 DIAGNOSIS — E1169 Type 2 diabetes mellitus with other specified complication: Secondary | ICD-10-CM | POA: Diagnosis not present

## 2024-07-21 DIAGNOSIS — Z794 Long term (current) use of insulin: Secondary | ICD-10-CM | POA: Diagnosis not present

## 2024-07-21 LAB — URINALYSIS, ROUTINE W REFLEX MICROSCOPIC
Bilirubin, UA: NEGATIVE
Glucose, UA: NEGATIVE
Ketones, UA: NEGATIVE
Leukocytes,UA: NEGATIVE
Nitrite, UA: NEGATIVE
Protein,UA: NEGATIVE
RBC, UA: NEGATIVE
Specific Gravity, UA: 1.015 (ref 1.005–1.030)
Urobilinogen, Ur: 0.2 mg/dL (ref 0.2–1.0)
pH, UA: 5.5 (ref 5.0–7.5)

## 2024-07-21 MED ORDER — LANTUS SOLOSTAR 100 UNIT/ML ~~LOC~~ SOPN: 50.0000 [IU] | PEN_INJECTOR | Freq: Every day | SUBCUTANEOUS | Status: DC

## 2024-07-21 NOTE — Progress Notes (Signed)
 BP 105/76   Pulse 90   Temp 97.9 F (36.6 C) (Oral)   Ht 6' (1.829 m)   Wt 273 lb 6 oz (124 kg)   SpO2 99%   BMI 37.08 kg/m    Subjective:    Patient ID: Emily Hinton, female    DOB: Jul 14, 1961, 63 y.o.   MRN: [id number]  HPI: Emily Hinton is a 63 y.o. female  Chief Complaint  Patient presents with   Dizziness   Nausea   Fatigue   DIABETES Patient states she has been using her Lantus  and Humalog .  She started Mounjaro  last week.  States it is making her nauseous. She does plan to continue with the medication this week. States she is having nausea, dizziness and fatigue. Hypoglycemic episodes:no Polydipsia/polyuria: no Visual disturbance: no Chest pain: no Paresthesias: no Glucose Monitoring: yes  Accucheck frequency: Daily  Fasting glucose: 130  Post prandial:  Evening:  Before meals: Taking Insulin ?: yes  Long acting insulin : Lantus  40u  Short acting insulin :  Blood Pressure Monitoring: daily Retinal Examination: Up to Date Foot Exam: up to date Diabetic Education: Not Completed Pneumovax: Up to Date Influenza: Up to Date Aspirin : no  Relevant past medical, surgical, family and social history reviewed and updated as indicated. Interim medical history since our last visit reviewed. Allergies and medications reviewed and updated.  Review of Systems  Constitutional:  Positive for fatigue.  Gastrointestinal:  Positive for nausea.  Neurological:  Positive for dizziness.    Per HPI unless specifically indicated above     Objective:    BP 105/76   Pulse 90   Temp 97.9 F (36.6 C) (Oral)   Ht 6' (1.829 m)   Wt 273 lb 6 oz (124 kg)   SpO2 99%   BMI 37.08 kg/m   Wt Readings from Last 3 Encounters:  07/21/24 273 lb 6 oz (124 kg)  07/08/24 275 lb 6.4 oz (124.9 kg)  07/03/24 275 lb 2 oz (124.8 kg)    Physical Exam Vitals and nursing note reviewed.  Constitutional:      General: She is not in acute distress.     Appearance: Normal appearance. She is obese. She is not ill-appearing, toxic-appearing or diaphoretic.  HENT:     Head: Normocephalic.     Right Ear: External ear normal.     Left Ear: External ear normal.     Nose: Nose normal.     Mouth/Throat:     Mouth: Mucous membranes are moist.     Pharynx: Oropharynx is clear.  Eyes:     General:        Right eye: No discharge.        Left eye: No discharge.     Extraocular Movements: Extraocular movements intact.     Conjunctiva/sclera: Conjunctivae normal.     Pupils: Pupils are equal, round, and reactive to light.  Cardiovascular:     Rate and Rhythm: Normal rate and regular rhythm.     Heart sounds: No murmur heard. Pulmonary:     Effort: Pulmonary effort is normal. No respiratory distress.     Breath sounds: Normal breath sounds. No wheezing or rales.  Musculoskeletal:     Cervical back: Normal range of motion and neck supple.  Skin:    General: Skin is warm and dry.     Capillary Refill: Capillary refill takes less than 2 seconds.  Neurological:     General: No focal deficit present.  Mental Status: She is alert and oriented to person, place, and time. Mental status is at baseline.  Psychiatric:        Mood and Affect: Mood normal.        Behavior: Behavior normal.        Thought Content: Thought content normal.        Judgment: Judgment normal.     Results for orders placed or performed in visit on 07/08/24  Comprehensive metabolic panel with GFR   Collection Time: 07/08/24 10:37 AM  Result Value Ref Range   Glucose 320 (H) 70 - 99 mg/dL   BUN 10 8 - 27 mg/dL   Creatinine, Ser 8.97 (H) 0.57 - 1.00 mg/dL   eGFR 63 >40 fO/fpw/8.26   BUN/Creatinine Ratio 10 (L) 12 - 28   Sodium 136 134 - 144 mmol/L   Potassium 5.2 3.5 - 5.2 mmol/L   Chloride 94 (L) 96 - 106 mmol/L   CO2 25 20 - 29 mmol/L   Calcium  9.8 8.7 - 10.3 mg/dL   Total Protein 8.0 6.0 - 8.5 g/dL   Albumin 4.6 3.9 - 4.9 g/dL   Globulin, Total 3.4 1.5 - 4.5  g/dL   Bilirubin Total 0.3 0.0 - 1.2 mg/dL   Alkaline Phosphatase 125 49 - 135 IU/L   AST 15 0 - 40 IU/L   ALT 18 0 - 32 IU/L  CBC With Diff/Platelet   Collection Time: 07/08/24 10:37 AM  Result Value Ref Range   WBC 6.3 3.4 - 10.8 x10E3/uL   RBC 4.88 3.77 - 5.28 x10E6/uL   Hemoglobin 14.5 11.1 - 15.9 g/dL   Hematocrit 54.7 65.9 - 46.6 %   MCV 93 79 - 97 fL   MCH 29.7 26.6 - 33.0 pg   MCHC 32.1 31.5 - 35.7 g/dL   RDW 87.4 88.2 - 84.5 %   Platelets 331 150 - 450 x10E3/uL   Neutrophils 60 Not Estab. %   Lymphs 30 Not Estab. %   Monocytes 8 Not Estab. %   Eos 2 Not Estab. %   Basos 0 Not Estab. %   Neutrophils Absolute 3.7 1.4 - 7.0 x10E3/uL   Lymphocytes Absolute 1.9 0.7 - 3.1 x10E3/uL   Monocytes Absolute 0.5 0.1 - 0.9 x10E3/uL   EOS (ABSOLUTE) 0.1 0.0 - 0.4 x10E3/uL   Basophils Absolute 0.0 0.0 - 0.2 x10E3/uL   Immature Granulocytes 0 Not Estab. %   Immature Grans (Abs) 0.0 0.0 - 0.1 x10E3/uL  Hemoglobin A1c   Collection Time: 07/08/24 10:37 AM  Result Value Ref Range   Hgb A1c MFr Bld 15.4 (H) 4.8 - 5.6 %   Est. average glucose Bld gHb Est-mCnc 395 mg/dL      Assessment & Plan:   Problem List Items Addressed This Visit       Endocrine   Diabetes mellitus (HCC) - Primary   Chronic. Not well controlled.  Encouraged patient to continue with Mounjaro .  Can give in her leg to see if side effects improve.  Increase Lantus  to 50u and Humalog  to 5u TID.  UA checked in office and no evidence of Ketones. Will restart Jardiance . 1 week sample given during office visit today.  Follow up in 1 month.  Call sooner if concerns arise.       Relevant Medications   insulin  lispro (HUMALOG ) 100 UNIT/ML injection   insulin  glargine (LANTUS  SOLOSTAR) 100 UNIT/ML Solostar Pen   Other Relevant Orders   Urinalysis, Routine w reflex microscopic  Follow up plan: Return in about 1 month (around 08/21/2024) for Medication Management.

## 2024-07-21 NOTE — Assessment & Plan Note (Signed)
 Chronic. Not well controlled.  Encouraged patient to continue with Mounjaro .  Can give in her leg to see if side effects improve.  Increase Lantus  to 50u and Humalog  to 5u TID.  UA checked in office and no evidence of Ketones. Will restart Jardiance . 1 week sample given during office visit today.  Follow up in 1 month.  Call sooner if concerns arise.

## 2024-07-23 ENCOUNTER — Telehealth: Payer: Self-pay | Admitting: Pharmacy Technician

## 2024-07-23 NOTE — Progress Notes (Signed)
   07/23/2024 Name: Emily Hinton MRN: [id number] DOB: 1961-05-15  Patient is appearing on a report for [facility] and last engaged with the clinical pharmacist to discuss diabetes on 07/17/2024. Contacted patient today to discuss diabetes management and completed medication review.   Diabetes Plan from last clinical pharmacist appointment:  Patient is already enrolled in [facility] for cardiomyopathy diagnosis that will cover copay for Jardiance  at the pharmacy. Made patient aware of this, but that a urinalysis needs to be done prior to continuing Jardiance  25mg  daily at this time. Patient plans to come to [facility] Monday or Tuesday to have this completed and has a follow-up with PCP on 10/10. If ketones are present in the urine, I recommend holding Jardiance  until A1c improves and no ketones are present to prevent risk of DKA. Patient states home glucometer is less than 1 yr old, so it should be accurate. FBG this morning was >200, and patient does verify she has increased Lantus  to 40 units daily and is taking Mounjaro  2.5mg  weekly and metformin  1000mg  BID as prescribed. Goal to further increase Mounjaro  as able.    Medication Adherence Barriers Identified:  Patient made recommended medication changes per plan: Yes was asked to call pharmacy by PharmD. Was asked to ensure pharmacy was billing Jardiance  correctly as patient is enrolled in a [name]. Spoke to pharmacist who was able to process the claim with no cost to patient. Pharmacist informs medication will have to be ordered and patient can pick it up after lunch on 07/24/24. Patient informs she is adherent to the following medications and has them on hand to take: Mounjaro  2.5mg  weekly, Lantus  50 units daily( she knows this is a new dose), Humalog  5 units 3 times a day (she knows this is a new dose), Jardiance  25mg  daily and Metformin  1000mg  twice a day. Access issues with any new medication or testing  device: No Patient informs she currently has all of these medications listed above on hand. Per Dr Annemarie data, Jardiance  was filled today, Mounjaro  last sold on 9/28 for 28 day supply and Metformin  last shows a fill date of 11/12/23 for 180 tablets or 90 days supply ([facility] medication dispense history shows a last fill date of 02/12/2024). No fill history data available for Lantus  and Humalog .  Patient is checking blood sugars as prescribed: Yes However, patient was not able to provide me with any results at the time of this call. Patient's last A1C on 07/08/24 was 15.4 an increase from 8 months ago. Patient reminded of alternate administration site  for Mounjaro  as informed by MD at 07/21/24 office visit to help reduce side effects of Mounjaro    Medication Adherence Barriers Addressed/Actions Taken:  Reviewed medication changes per plan from last clinical pharmacist note Medication Access for Jardiance  Will discuss medication access concerns with pharmacist Contacted pharmacy regarding billing Jardiance  to primary and secondary insurance cards for Jardiance . Educated patient to contact pharmacy regarding new prescriptions  Reviewed instructions for monitoring blood sugars at home and reminded patient to keep a written log to review with pharmacist Reminded patient of date/time of upcoming clinical pharmacist follow up and any upcoming PCP/specialists visits. Patient denies transportation barriers to the appointment. Yes  Next clinical pharmacist appointment is scheduled for: 08/14/2024   Kate Caddy, CPhT [facility] Office: [phone] Email: [email or web address]

## 2024-07-24 ENCOUNTER — Ambulatory Visit: Admitting: Nurse Practitioner

## 2024-07-31 ENCOUNTER — Encounter: Admitting: Cardiology

## 2024-08-11 ENCOUNTER — Telehealth: Payer: Self-pay | Admitting: Cardiology

## 2024-08-11 NOTE — Telephone Encounter (Signed)
 Called to r/s appt on 11/14

## 2024-08-12 ENCOUNTER — Telehealth: Payer: Self-pay | Admitting: Cardiology

## 2024-08-12 MED ORDER — MOUNJARO 2.5 MG/0.5ML ~~LOC~~ SOAJ: 2.5000 mg | SUBCUTANEOUS | 2 refills | Status: DC

## 2024-08-12 NOTE — Telephone Encounter (Signed)
 Mounjaro refill sent to pharmacy.

## 2024-08-13 NOTE — Progress Notes (Signed)
 08/14/2024 Name: Emily Hinton MRN: [id number] DOB: 1961-07-01  Chief Complaint  Patient presents with   Diabetes Management Plan   Emily Chuck- Shawl is a 63 y.o. year old female who presented for a telephone follow-up visit.   Subjective:  Care Team: Primary Care Provider: Melvin Pao, NP ; Next Scheduled Visit: 11/7  Medication Access/Adherence  Current Pharmacy:  [id number] [address], [place] - [address] AT [facility] OF [address] [place] [address] Phone: [phone] Fax: [phone]  -Patient reports affordability concerns with their medications: Yes  -Patient reports access/transportation concerns to their pharmacy: No  -Patient reports adherence concerns with their medications:  Yes    Diabetes: Current medications: Mounjaro  2.5mg  weekly, Jardiance  25mg  daily, Lantus  50 units at bedtime, Hunalog 5 units TID with meals, metformin  1000mg  BID -Started Mounjaro  approximately 1 month ago; had stomach upset after first dose; but this has resolved, and she is tolerating the medication well now (giving in leg versus stomach) -Lantus  recently increased from 40 units to 50 units daily -Humalog  increased from 3 untis TID to 5 units TID -Current glucose readings: FBG 130-140, 95-110 later in the afternoon -Patient had gone without Jardiance  for a while due to cost.  Recently verified no ketones present in urine, so it was safe to resume medication.  Provided [employer] information to pharmacy that covered Jardiance  copay; and patient was able to pick up at no charge -Running low on metformin  and needs a refill  Macrovascular and Microvascular Risk Reduction:  Statin? yes (rosuvastatin  10mg  daily); ACEi/ARB? yes (Entresto  97/103 BID) Last urinary albumin/creatinine ratio:  Lab Results  Component Value Date   MICRALBCREAT 30-300 (H) 12/07/2022   Last eye exam:  Lab Results   Component Value Date   HMDIABEYEEXA No Retinopathy 08/06/2022   Last foot exam: 08/14/2022 Tobacco Use:  Tobacco Use: Low Risk  (07/21/2024)   Patient History    Smoking Tobacco Use: Never    Smokeless Tobacco Use: Never    Passive Exposure: Not on file   Heart Failure (EF 40-45%):  Current medications:  ACEi/ARB/ARNI: Entresto  97/103mg  BID- patient has been without this medication >1 month due to cost SGLT2i: Jardiance  25mg  daily recently resumed Beta blocker: carvedilol  25mg  BID Mineralocorticoid Receptor Antagonist: Spironolactone  25mg  daily Diuretic regimen: Torsemide  20mg  daily  -Last office BP of 105/76 on 10/7  Objective: Lab Results  Component Value Date   HGBA1C 15.4 (H) 07/08/2024   Lab Results  Component Value Date   CREATININE 1.02 (H) 07/08/2024   BUN 10 07/08/2024   NA 136 07/08/2024   K 5.2 07/08/2024   CL 94 (L) 07/08/2024   CO2 25 07/08/2024   Lab Results  Component Value Date   CHOL 127 07/03/2024   HDL 55 07/03/2024   LDLCALC 54 07/03/2024   TRIG 97 07/03/2024   CHOLHDL 2.3 07/03/2024   Medications Reviewed Today     Reviewed by Deanna Channing LABOR, RPH (Pharmacist) on 08/14/24 at 1116  Med List Status: <None>   Medication Order Taking? Sig Documenting Provider Last Dose Status Informant  acetaminophen  (TYLENOL ) 500 MG tablet [id number]  Take 500 mg by mouth every 6 (six) hours as needed. [provider]  Active   aspirin  81 MG chewable tablet [id number]  Chew 1 tablet (81 mg total) by mouth daily. Abigail Bernardino HERO, PA-C  Active   carvedilol  (COREG ) 25 MG tablet [id number]  Take 1 tablet (25 mg total)  by mouth 2 (two) times daily. Abigail Bernardino HERO, PA-C  Active   citalopram  (CELEXA ) 20 MG tablet [id number]  Take 1 tablet (20 mg total) by mouth daily. Melvin Pao, NP  Active   empagliflozin  (JARDIANCE ) 25 MG TABS tablet [id number] Yes Take 1 tablet (25 mg total) by mouth daily before breakfast. To replace 10mg - place on hold until patient requests  please Melvin Pao, NP  Active   gabapentin  (NEURONTIN ) 300 MG capsule [id number]  TAKE 1 CAPSULE(300 MG) BY MOUTH THREE TIMES DAILY Melvin Pao, NP  Active   insulin  glargine (LANTUS  SOLOSTAR) 100 UNIT/ML Solostar Pen [id number] Yes Inject 50 Units into the skin at bedtime. Melvin Pao, NP  Active   insulin  lispro (HUMALOG ) 100 UNIT/ML injection [id number] Yes Inject 5 Units into the skin 3 (three) times daily before meals. [provider]  Active   metFORMIN  (GLUCOPHAGE ) 1000 MG tablet [id number]  TAKE 1 TABLET(1000 MG) BY MOUTH TWICE DAILY WITH A MEAL Melvin Pao, NP  Active   omeprazole  (PRILOSEC) 20 MG capsule [id number]  TAKE 1 CAPSULE(20 MG) BY MOUTH DAILY Melvin Pao, NP  Active   rosuvastatin  (CRESTOR ) 10 MG tablet [id number] Yes TAKE 1 TABLET(10 MG) BY MOUTH DAILY Melvin Pao, NP  Active   sacubitril -valsartan  (ENTRESTO ) 97-103 MG [id number]  Take 1 tablet by mouth 2 (two) times daily.  Patient not taking: Reported on 08/14/2024   Abigail Bernardino HERO, PA-C  Active            Med Note ZENA, [place] A   Wed Jul 24, 2023  4:31 PM) Working on PAP  spironolactone  (ALDACTONE ) 25 MG tablet [id number] Yes TAKE 1 TABLET(25 MG) BY MOUTH DAILY Gollan, Timothy J, MD  Active   tirzepatide  (MOUNJARO ) 2.5 MG/0.5ML Pen [id number] Yes Inject 2.5 mg into the skin once a week. Rolan Ezra RAMAN, MD  Active   torsemide  (DEMADEX ) 20 MG tablet [id number] Yes Take 2 tablets (40 mg total) by mouth daily for 3 days, THEN 1 tablet (20 mg total) daily.  Patient taking differently: Take 1 tablet (20 mg total) daily.   Abigail Bernardino HERO, PA-C  Active   triamcinolone  cream (KENALOG ) 0.1 % [id number]  Apply 1 Application topically 2 (two) times daily. Melvin Pao, NP  Active   Vericiguat  2.5 MG TABS [id number]  Take 1 tablet (2.5 mg total) by mouth daily. Rolan Ezra RAMAN, MD  Active            Assessment/Plan:   Diabetes: -Currently uncontrolled; goal A1c <7%. Cardiorenal risk  reduction is optimized.. Blood pressure is at goal <130/80. LDL is at goal.  -Continue current regimen at this time -Mounjaro  2.5mg  refills sent to [facility] 10/29, and refill is ready for pick up per pharmacy ($47 copay) -No refills remain on metformin ; order pending for PCP to sign if in agreement  Heart Failure: -Currently appropriately managed -Resume Entresto  97/103mg  BID; [employer] Cardiomyopathy grant will cover this medication also -Contacted pharmacy to provide [name] information, but prescription for Entresto  expired in August; order pending for PCP to sign if in agreement since patient sees them again next week and can f/u on BP control -Monitor home BP regularly since BP was low at last OV, and patient had not been taking Entresto .  Consider dose decrease if hypotension occurs.  Follow Up Plan: 12/8  Channing DELENA Mealing, PharmD, DPLA

## 2024-08-14 ENCOUNTER — Other Ambulatory Visit: Payer: Self-pay | Admitting: Physician Assistant

## 2024-08-14 ENCOUNTER — Other Ambulatory Visit: Payer: Self-pay

## 2024-08-14 DIAGNOSIS — I5020 Unspecified systolic (congestive) heart failure: Secondary | ICD-10-CM

## 2024-08-14 DIAGNOSIS — E781 Pure hyperglyceridemia: Secondary | ICD-10-CM

## 2024-08-14 DIAGNOSIS — E1169 Type 2 diabetes mellitus with other specified complication: Secondary | ICD-10-CM

## 2024-08-14 DIAGNOSIS — I10 Essential (primary) hypertension: Secondary | ICD-10-CM

## 2024-08-17 MED ORDER — SACUBITRIL-VALSARTAN 97-103 MG PO TABS: 1.0000 | ORAL_TABLET | Freq: Two times a day (BID) | ORAL | 0 refills | Status: DC

## 2024-08-17 MED ORDER — METFORMIN HCL 1000 MG PO TABS: 1000.0000 mg | ORAL_TABLET | Freq: Two times a day (BID) | ORAL | 2 refills | Status: AC

## 2024-08-21 ENCOUNTER — Encounter: Payer: Self-pay | Admitting: Nurse Practitioner

## 2024-08-21 ENCOUNTER — Ambulatory Visit: Admitting: Nurse Practitioner

## 2024-08-21 VITALS — BP 111/72 | HR 67 | Temp 98.6°F | Ht 72.0 in | Wt 285.2 lb

## 2024-08-21 DIAGNOSIS — Z794 Long term (current) use of insulin: Secondary | ICD-10-CM

## 2024-08-21 DIAGNOSIS — E1169 Type 2 diabetes mellitus with other specified complication: Secondary | ICD-10-CM

## 2024-08-21 MED ORDER — INSULIN LISPRO 100 UNIT/ML IJ SOLN: 5.0000 [IU] | Freq: Three times a day (TID) | INTRAMUSCULAR | 1 refills | Status: DC

## 2024-08-21 MED ORDER — TIRZEPATIDE 5 MG/0.5ML ~~LOC~~ SOAJ: 5.0000 mg | SUBCUTANEOUS | 1 refills | Status: DC

## 2024-08-21 MED ORDER — LANTUS SOLOSTAR 100 UNIT/ML ~~LOC~~ SOPN: 60.0000 [IU] | PEN_INJECTOR | Freq: Every day | SUBCUTANEOUS | 1 refills | Status: AC

## 2024-08-21 NOTE — Assessment & Plan Note (Signed)
 Chronic. Not well controlled.  Will increase Mounjaro  to 5mg .  Lantus  increased to 60u.  Continue to check sugars at home and bring log to next visit.

## 2024-08-21 NOTE — Progress Notes (Signed)
 BP 111/72   Pulse 67   Temp 98.6 F (37 C) (Oral)   Ht 6' (1.829 m)   Wt 285 lb 3.2 oz (129.4 kg)   SpO2 99%   BMI 38.68 kg/m    Subjective:    Patient ID: Emily Hinton, female    DOB: 1961-01-31, 63 y.o.   MRN: [id number]  HPI: Emily Hinton is a 63 y.o. female  Chief Complaint  Patient presents with   Diabetes   DIABETES Patient states she if feeling okay.  Denies any side effects from Mounjaro .  Denies nausea, vomiting, constipation. Hypoglycemic episodes:no Polydipsia/polyuria: no Visual disturbance: no Chest pain: no Paresthesias: no Glucose Monitoring: yes  Accucheck frequency: Daily  Fasting glucose: 133  Post prandial:  Evening:  Before meals: Taking Insulin ?: yes  Long acting insulin : Lantus  50u  Short acting insulin : 5u TID Blood Pressure Monitoring: daily Retinal Examination: Up to Date Foot Exam: up to date Diabetic Education: Not Completed Pneumovax: Up to Date Influenza: Up to Date Aspirin : no  Relevant past medical, surgical, family and social history reviewed and updated as indicated. Interim medical history since our last visit reviewed. Allergies and medications reviewed and updated.  Review of Systems  Eyes:  Negative for visual disturbance.  Cardiovascular:  Negative for chest pain.  Gastrointestinal:  Negative for constipation, nausea and vomiting.  Endocrine: Negative for polydipsia and polyuria.  Neurological:  Negative for numbness.    Per HPI unless specifically indicated above     Objective:    BP 111/72   Pulse 67   Temp 98.6 F (37 C) (Oral)   Ht 6' (1.829 m)   Wt 285 lb 3.2 oz (129.4 kg)   SpO2 99%   BMI 38.68 kg/m   Wt Readings from Last 3 Encounters:  08/21/24 285 lb 3.2 oz (129.4 kg)  07/21/24 273 lb 6 oz (124 kg)  07/08/24 275 lb 6.4 oz (124.9 kg)    Physical Exam Vitals and nursing note reviewed.  Constitutional:      General: She is not in acute distress.    Appearance:  Normal appearance. She is obese. She is not ill-appearing, toxic-appearing or diaphoretic.  HENT:     Head: Normocephalic.     Right Ear: External ear normal.     Left Ear: External ear normal.     Nose: Nose normal.     Mouth/Throat:     Mouth: Mucous membranes are moist.     Pharynx: Oropharynx is clear.  Eyes:     General:        Right eye: No discharge.        Left eye: No discharge.     Extraocular Movements: Extraocular movements intact.     Conjunctiva/sclera: Conjunctivae normal.     Pupils: Pupils are equal, round, and reactive to light.  Cardiovascular:     Rate and Rhythm: Normal rate and regular rhythm.     Heart sounds: No murmur heard. Pulmonary:     Effort: Pulmonary effort is normal. No respiratory distress.     Breath sounds: Normal breath sounds. No wheezing or rales.  Musculoskeletal:     Cervical back: Normal range of motion and neck supple.  Skin:    General: Skin is warm and dry.     Capillary Refill: Capillary refill takes less than 2 seconds.  Neurological:     General: No focal deficit present.     Mental Status: She is alert and oriented to person, place,  and time. Mental status is at baseline.  Psychiatric:        Mood and Affect: Mood normal.        Behavior: Behavior normal.        Thought Content: Thought content normal.        Judgment: Judgment normal.     Results for orders placed or performed in visit on 07/21/24  Urinalysis, Routine w reflex microscopic   Collection Time: 07/21/24 11:15 AM  Result Value Ref Range   Specific Gravity, UA 1.015 1.005 - 1.030   pH, UA 5.5 5.0 - 7.5   Color, UA Yellow Yellow   Appearance Ur Clear Clear   Leukocytes,UA Negative Negative   Protein,UA Negative Negative/Trace   Glucose, UA Negative Negative   Ketones, UA Negative Negative   RBC, UA Negative Negative   Bilirubin, UA Negative Negative   Urobilinogen, Ur 0.2 0.2 - 1.0 mg/dL   Nitrite, UA Negative Negative   Microscopic Examination Comment        Assessment & Plan:   Problem List Items Addressed This Visit       Endocrine   Diabetes mellitus (HCC) - Primary   Chronic. Not well controlled.  Will increase Mounjaro  to 5mg .  Lantus  increased to 60u.  Continue to check sugars at home and bring log to next visit.      Relevant Medications   tirzepatide  (MOUNJARO ) 5 MG/0.5ML Pen   insulin  glargine (LANTUS  SOLOSTAR) 100 UNIT/ML Solostar Pen   insulin  lispro (HUMALOG ) 100 UNIT/ML injection      Follow up plan: Return in about 2 months (around 10/21/2024) for HTN, HLD, DM2 FU.

## 2024-08-23 ENCOUNTER — Other Ambulatory Visit: Payer: Self-pay | Admitting: Nurse Practitioner

## 2024-08-24 NOTE — Progress Notes (Unsigned)
   GYN ENCOUNTER  Subjective  HPI: Emily Hinton is a 63 y.o. No obstetric history on file. who presents today for ***  Past Medical History:  Diagnosis Date   CHF (congestive heart failure) (HCC)    CSF leak    Diabetes mellitus without complication (HCC)    DM2 (diabetes mellitus, type 2) (HCC)    Essential hypertension    Heart attack (HCC)    Hyperlipidemia    Morbid obesity (HCC)    NICM (nonischemic cardiomyopathy) (HCC)    Nonobstructive atherosclerosis of coronary artery    OSA (obstructive sleep apnea)    Past Surgical History:  Procedure Laterality Date   CERVICAL FUSION  2017   CSF leak repair x 2     2014 and 2017   CSF SHUNT  2017   OB History   No obstetric history on file.    Allergies  Allergen Reactions   Cashew Nut (Anacardium Occidentale) Skin Test Hives, Itching and Rash   Cashew Nut Oil Hives, Itching and Rash    Constitutional: Denied constitutional symptoms, night sweats, recent illness, fatigue, fever, insomnia and weight loss.  Eyes: Denied eye symptoms, eye pain, photophobia, vision change and visual disturbance.  Ears/Nose/Throat/Neck: Denied ear, nose, throat or neck symptoms, hearing loss, nasal discharge, sinus congestion and sore throat.  Cardiovascular: Denied cardiovascular symptoms, arrhythmia, chest pain/pressure, edema, exercise intolerance, orthopnea and palpitations.  Respiratory: Denied pulmonary symptoms, asthma, pleuritic pain, productive sputum, cough, dyspnea and wheezing.  Gastrointestinal: Denied, gastro-esophageal reflux, melena, nausea and vomiting.  Genitourinary:*** Denied genitourinary symptoms including symptomatic vaginal discharge, pelvic relaxation issues, and urinary complaints.  Musculoskeletal: Denied musculoskeletal symptoms, stiffness, swelling, muscle weakness and myalgia.  Dermatologic: Denied dermatology symptoms, rash and scar.  Neurologic: Denied neurology symptoms, dizziness, headache, neck  pain and syncope.  Psychiatric: Denied psychiatric symptoms, anxiety and depression.  Endocrine: Denied endocrine symptoms including hot flashes and night sweats.    Objective  There were no vitals taken for this visit.  Physical examination   Pelvic:   Vulva: Normal appearance.  No lesions.  Vagina: No lesions or abnormalities noted.  Support: Normal pelvic support.  Urethra No masses tenderness or scarring.  Meatus Normal size without lesions or prolapse.  Cervix: Normal appearance.  No lesions.  Anus: Normal exam.  No lesions.  Perineum: Normal exam.  No lesions.        Bimanual   Uterus: Normal size.  Non-tender.  Mobile.  AV.  Adnexae: No masses.  Non-tender to palpation.  Cul-de-sac: Negative for abnormality.    Assessment   Plan    Eleanor Canny, CNM

## 2024-08-24 NOTE — Telephone Encounter (Signed)
 Requested Prescriptions  Refused Prescriptions Disp Refills   ENTRESTO  97-103 MG [Pharmacy Med Name: ENTRESTO  97-103MG  TABLETS] 60 tablet 0    Sig: TAKE 1 TABLET BY MOUTH TWICE DAILY     Off-Protocol Failed - 08/24/2024  5:34 PM      Failed - Medication not assigned to a protocol, review manually.      Passed - Valid encounter within last 12 months    Recent Outpatient Visits           3 days ago Type 2 diabetes mellitus with other specified complication, with long-term current use of insulin  [facility])   [place] [facility] Melvin Pao, NP   1 month ago Type 2 diabetes mellitus with other specified complication, with long-term current use of insulin  [facility])   [facility] Melvin Pao, NP   1 month ago HFrEF (heart failure with reduced ejection fraction) [facility])   [place] [facility] Melvin Pao, NP   4 months ago Morbid obesity [facility])   [place] [facility] Melvin Pao, NP   8 months ago HFrEF (heart failure with reduced ejection fraction) [facility])   [place] [facility] [place] Melvin Pao, NP       Future Appointments             In 2 months Dunn, Bernardino HERO, PA-C [facility]

## 2024-08-28 ENCOUNTER — Ambulatory Visit (INDEPENDENT_AMBULATORY_CARE_PROVIDER_SITE_OTHER): Admitting: Obstetrics

## 2024-08-28 ENCOUNTER — Encounter: Payer: Self-pay | Admitting: Obstetrics

## 2024-08-28 ENCOUNTER — Other Ambulatory Visit (HOSPITAL_COMMUNITY)
Admission: RE | Admit: 2024-08-28 | Discharge: 2024-08-28 | Disposition: A | Source: Ambulatory Visit | Attending: Obstetrics | Admitting: Obstetrics

## 2024-08-28 ENCOUNTER — Ambulatory Visit: Attending: Cardiology

## 2024-08-28 ENCOUNTER — Encounter: Admitting: Cardiology

## 2024-08-28 VITALS — BP 136/89 | HR 69 | Ht 71.0 in | Wt 286.0 lb

## 2024-08-28 DIAGNOSIS — Z113 Encounter for screening for infections with a predominantly sexual mode of transmission: Secondary | ICD-10-CM | POA: Diagnosis not present

## 2024-08-28 DIAGNOSIS — N958 Other specified menopausal and perimenopausal disorders: Secondary | ICD-10-CM

## 2024-08-28 DIAGNOSIS — Z124 Encounter for screening for malignant neoplasm of cervix: Secondary | ICD-10-CM | POA: Insufficient documentation

## 2024-08-28 DIAGNOSIS — Z7689 Persons encountering health services in other specified circumstances: Secondary | ICD-10-CM

## 2024-08-28 DIAGNOSIS — Z1151 Encounter for screening for human papillomavirus (HPV): Secondary | ICD-10-CM | POA: Insufficient documentation

## 2024-08-28 DIAGNOSIS — R102 Pelvic and perineal pain unspecified side: Secondary | ICD-10-CM | POA: Insufficient documentation

## 2024-08-28 DIAGNOSIS — Z01419 Encounter for gynecological examination (general) (routine) without abnormal findings: Secondary | ICD-10-CM | POA: Diagnosis present

## 2024-08-28 DIAGNOSIS — Z1231 Encounter for screening mammogram for malignant neoplasm of breast: Secondary | ICD-10-CM

## 2024-08-28 DIAGNOSIS — I5020 Unspecified systolic (congestive) heart failure: Secondary | ICD-10-CM

## 2024-08-28 DIAGNOSIS — F410 Panic disorder [episodic paroxysmal anxiety] without agoraphobia: Secondary | ICD-10-CM

## 2024-08-28 LAB — ECHOCARDIOGRAM COMPLETE
AR max vel: 1.57 cm2
AV Area VTI: 1.56 cm2
AV Area mean vel: 1.52 cm2
AV Mean grad: 6 mmHg
AV Peak grad: 10.8 mmHg
Ao pk vel: 1.64 m/s
Area-P 1/2: 3.5 cm2
Height: 71 in
S' Lateral: 4.5 cm
Weight: 4576 [oz_av]

## 2024-08-28 MED ORDER — PREMARIN 0.625 MG/GM VA CREA: 0.5000 g | TOPICAL_CREAM | Freq: Every day | VAGINAL | 12 refills | Status: AC

## 2024-08-28 NOTE — Progress Notes (Signed)
 ANNUAL GYNECOLOGICAL EXAM  SUBJECTIVE  HPI  Emily Hinton is a 63 y.o.-year-old [id number] who presents for an annual gynecological exam today.  She denies pelvic pain, abnormal vaginal bleeding, and UTI symptoms. She reports vaginal itching and odor with a creamy white discharge. She reports mild dyspareunia d/t vaginal dryness.  Medical/Surgical History Past Medical History:  Diagnosis Date   CHF (congestive heart failure) (HCC)    CSF leak    Diabetes mellitus without complication (HCC)    DM2 (diabetes mellitus, type 2) (HCC)    Essential hypertension    Heart attack (HCC)    Hyperlipidemia    Morbid obesity (HCC)    NICM (nonischemic cardiomyopathy) (HCC)    Nonobstructive atherosclerosis of coronary artery    OSA (obstructive sleep apnea)    Past Surgical History:  Procedure Laterality Date   CERVICAL FUSION  2017   CSF leak repair x 2     2014 and 2017   CSF SHUNT  2017    Social History Lives with sons, stepson, and grandson. Feels safe there Work: elder care Exercise: walking Substances: Denies EtOH, tobacco, vape, and recreational drugs  Obstetric History OB History     Gravida  4   Para  2   Term  2   Preterm      AB  2   Living  2      SAB      IAB      Ectopic      Multiple      Live Births  2            GYN/Menstrual History No LMP recorded. Patient is postmenopausal. Last Pap: unsure Contraception: post-menopausal  Prevention Mammogram: yearly Colonoscopy: last done 5 years ago. Due in 5 years.  Current Medications Outpatient Medications Prior to Visit  Medication Sig   acetaminophen  (TYLENOL ) 500 MG tablet Take 500 mg by mouth every 6 (six) hours as needed.   aspirin  81 MG chewable tablet Chew 1 tablet (81 mg total) by mouth daily.   carvedilol  (COREG ) 25 MG tablet Take 1 tablet (25 mg total) by mouth 2 (two) times daily.   citalopram  (CELEXA ) 20 MG tablet Take 1 tablet (20 mg total) by mouth daily.    empagliflozin  (JARDIANCE ) 25 MG TABS tablet Take 1 tablet (25 mg total) by mouth daily before breakfast. To replace 10mg - place on hold until patient requests please   gabapentin  (NEURONTIN ) 300 MG capsule TAKE 1 CAPSULE(300 MG) BY MOUTH THREE TIMES DAILY   insulin  glargine (LANTUS  SOLOSTAR) 100 UNIT/ML Solostar Pen Inject 60 Units into the skin at bedtime.   insulin  lispro (HUMALOG ) 100 UNIT/ML injection Inject 0.05 mLs (5 Units total) into the skin 3 (three) times daily before meals.   metFORMIN  (GLUCOPHAGE ) 1000 MG tablet Take 1 tablet (1,000 mg total) by mouth 2 (two) times daily with a meal.   omeprazole  (PRILOSEC) 20 MG capsule TAKE 1 CAPSULE(20 MG) BY MOUTH DAILY   rosuvastatin  (CRESTOR ) 10 MG tablet TAKE 1 TABLET(10 MG) BY MOUTH DAILY   sacubitril -valsartan  (ENTRESTO ) 97-103 MG Take 1 tablet by mouth 2 (two) times daily. [employer] grant secondary to insurance, please: ID [id number] BIN [id number] PCN [id number] Group [id number]   spironolactone  (ALDACTONE ) 25 MG tablet TAKE 1 TABLET(25 MG) BY MOUTH DAILY   tirzepatide  (MOUNJARO ) 5 MG/0.5ML Pen Inject 5 mg into the skin once a week.   torsemide  (DEMADEX ) 20 MG tablet Take 2 tablets (40 mg total) by mouth daily  for 3 days, THEN 1 tablet (20 mg total) daily. (Patient taking differently: Take 1 tablet (20 mg total) daily.)   triamcinolone  cream (KENALOG ) 0.1 % Apply 1 Application topically 2 (two) times daily.   Vericiguat  2.5 MG TABS Take 1 tablet (2.5 mg total) by mouth daily.   No facility-administered medications prior to visit.        ROS Constitutional: Denied constitutional symptoms, night sweats, recent illness, fatigue, fever, insomnia and weight loss.  Eyes: Denied eye symptoms, eye pain, photophobia, vision change and visual disturbance.  Ears/Nose/Throat/Neck: Denied ear, nose, throat or neck symptoms, hearing loss, nasal discharge, sinus congestion and sore throat.  Cardiovascular: Denied cardiovascular symptoms, arrhythmia,  chest pain/pressure, edema, exercise intolerance, orthopnea and palpitations.  Respiratory: Denied pulmonary symptoms, asthma, pleuritic pain, productive sputum, cough, dyspnea and wheezing.  Gastrointestinal: Denied gastro-esophageal reflux, melena, nausea and vomiting.  Genitourinary: See HPI  Musculoskeletal: Neck/head pain from surgery. Denied musculoskeletal symptoms, stiffness, swelling, muscle weakness and myalgia.  Dermatologic: Denied dermatology symptoms, rash and scar.  Neurologic: Denied neurology symptoms, dizziness, headache, neck pain and syncope.  Psychiatric: Anxiety/panic attacks r/t driving  Endocrine: Denied endocrine symptoms including hot flashes and night sweats.    OBJECTIVE  BP 136/89   Pulse 69   Ht 5' 11 (1.803 m)   Wt 286 lb (129.7 kg)   BMI 39.89 kg/m    Physical examination General NAD, Conversant  HEENT Atraumatic; Op clear with mmm.  Normo-cephalic. Pupils reactive. Anicteric sclerae  Thyroid/Neck Smooth without nodularity or enlargement. Normal ROM.  Neck Supple.  Skin No rashes, lesions or ulceration. Normal palpated skin turgor. No nodularity.  Breasts: No masses or discharge.  Symmetric.  No axillary adenopathy.  Lungs: Clear to auscultation.No rales or wheezes. Normal Respiratory effort, no retractions.  Heart: NSR.  No murmurs or rubs appreciated. No peripheral edema  Abdomen: Soft.  Non-tender.  No masses.  No HSM. No hernia  Extremities: Moves all appropriately.  Normal ROM for age. No lymphadenopathy.  Neuro: Oriented to PPT.  Normal mood. Normal affect.     Pelvic:   Vulva: Normal appearance.  No lesions.  Vagina: No lesions or abnormalities noted. Menopausal changes to vaginal tissue. Moderate amount of thin white discharge.  Support: Normal pelvic support.  Urethra No masses tenderness or scarring.  Meatus Normal size without lesions or prolapse.  Cervix: Normal appearance.  No lesions. Pap collected  Perineum: Normal exam.  No  lesions.    ASSESSMENT  1) Annual exam 2) Due for Pap 3) Vaginal discharge/itching 4) Dyspareunia, genitourinary syndrome of menopause 5) Anxiety r/t driving  PLAN 1) Physical exam as noted. Discussed healthy lifestyle choices and preventive care. STI testing today. Routine labs with PCP. Mammogram ordered. 2) Pap collected. F/u based no results 3) Swabs collected 4) Discussed use of vaginal estrogen. Jadea would like to try a cream. Rx sent to pharmacy. Discussed proper administration. 5) Interested in CBT. Referral sent to behavior health.  Return in one year for annual exam or as needed for concerns.   Ishmel Acevedo, CNM

## 2024-08-29 LAB — HEP, RPR, HIV PANEL
HIV Screen 4th Generation wRfx: NONREACTIVE
Hepatitis B Surface Ag: NEGATIVE
RPR Ser Ql: NONREACTIVE

## 2024-08-31 LAB — CERVICOVAGINAL ANCILLARY ONLY
Bacterial Vaginitis (gardnerella): POSITIVE — AB
Candida Glabrata: NEGATIVE
Candida Vaginitis: NEGATIVE
Chlamydia: NEGATIVE
Comment: NEGATIVE
Comment: NEGATIVE
Comment: NEGATIVE
Comment: NEGATIVE
Comment: NEGATIVE
Comment: NORMAL
Neisseria Gonorrhea: NEGATIVE
Trichomonas: NEGATIVE

## 2024-09-02 ENCOUNTER — Encounter: Payer: Self-pay | Admitting: Obstetrics

## 2024-09-02 ENCOUNTER — Ambulatory Visit: Payer: Self-pay | Admitting: Obstetrics

## 2024-09-02 ENCOUNTER — Other Ambulatory Visit: Payer: Self-pay | Admitting: Obstetrics

## 2024-09-02 DIAGNOSIS — B9689 Other specified bacterial agents as the cause of diseases classified elsewhere: Secondary | ICD-10-CM | POA: Insufficient documentation

## 2024-09-02 LAB — CYTOLOGY - PAP
Adequacy: ABSENT
Comment: NEGATIVE
Diagnosis: NEGATIVE
High risk HPV: NEGATIVE

## 2024-09-02 MED ORDER — METRONIDAZOLE 500 MG PO TABS: 500.0000 mg | ORAL_TABLET | Freq: Two times a day (BID) | ORAL | 0 refills | Status: DC

## 2024-09-02 NOTE — Progress Notes (Signed)
 Emily Hinton                                          MRN: [id number]   09/02/2024   The [facility] Quality Team Specialist reviewed this patient medical record for the purposes of chart review for care gap closure. The following were reviewed: chart review for care gap closure-glycemic status assessment.    [facility]BCI Quality Team

## 2024-09-02 NOTE — Progress Notes (Signed)
+  BV. Rx for metronidazole  500 mg PO BID x 7 days sent to pharmacy. [place] notified via MyChart.  M. Justino, CNM

## 2024-09-02 NOTE — Progress Notes (Signed)
 Emily Hinton                                          MRN: [id number]   09/02/2024   The [facility] Quality Team Specialist reviewed this patient medical record for the purposes of chart review for care gap closure. The following were reviewed: chart review for care gap closure-kidney health evaluation for diabetes:eGFR  and uACR.    [facility]BCI Quality Team

## 2024-09-07 ENCOUNTER — Other Ambulatory Visit: Payer: Self-pay | Admitting: Nurse Practitioner

## 2024-09-08 NOTE — Telephone Encounter (Signed)
 Requested Prescriptions  Pending Prescriptions Disp Refills   omeprazole  (PRILOSEC) 20 MG capsule [Pharmacy Med Name: OMEPRAZOLE  20MG  CAPSULES] 90 capsule 1    Sig: TAKE 1 CAPSULE(20 MG) BY MOUTH DAILY     Gastroenterology: Proton Pump Inhibitors Passed - 09/08/2024 11:18 AM      Passed - Valid encounter within last 12 months    Recent Outpatient Visits           2 weeks ago Type 2 diabetes mellitus with other specified complication, with long-term current use of insulin  [facility])   [place] [facility] Melvin Pao, NP   1 month ago Type 2 diabetes mellitus with other specified complication, with long-term current use of insulin  [facility])   [place] [facility] Melvin Pao, NP   2 months ago HFrEF (heart failure with reduced ejection fraction) [facility])   [place] [facility] Melvin Pao, NP   5 months ago Morbid obesity [facility])   [place] [facility] Melvin Pao, NP   8 months ago HFrEF (heart failure with reduced ejection fraction) [facility])   [place] [facility] Melvin Pao, NP       Future Appointments             In 1 month Dunn, Bernardino HERO, PA-C [employer] [facility]

## 2024-09-14 NOTE — Progress Notes (Unsigned)
 Electrophysiology Office Note:   Date:  09/15/2024  ID:  Emily Hinton, DOB 1961-01-24, MRN [id number]  Primary Cardiologist: Evalene Lunger, MD Electrophysiologist: Fonda Kitty, MD      History of Present Illness:   Emily Hinton is a 63 y.o. female with h/o OSA, type 2 diabetes, CSF leak requiring VP shunt, and chronic systolic heart failure secondary to nonischemic cardiomyopathy who is being seen today for ICD and CCM device evaluations.  Discussed the use of AI scribe software for clinical note transcription with the patient, who gave verbal consent to proceed.  History of Present Illness Emily Hinton is a 64 year old female who presents for evaluation of cardiac device candidacy. She was referred by Dr. Rolan for evaluation of a defibrillator or a cardiac contractility modulation (CCM) device.  She has a history of reduced heart function with prior ultrasounds and a cardiac MRI showing heart pumping function below 35%. Recently, an ultrasound indicated improvement with heart function between 45-50%.  She feels 'all right' and has been staying out of the hospital for a while. She experiences occasional swelling in her legs, particularly in one leg which is slightly larger than the other. 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 She sometimes stumbles when walking.  Her breathing is generally okay, but she experiences difficulty when climbing stairs, requiring her to take it slow. She is able to walk to her car and around the grocery store as long as she maintains a slow pace.   Review of systems complete and found to be negative unless listed in HPI.   EP Information / Studies Reviewed:    EKG is not ordered today. EKG from 06/26/24 reviewed which showed SR, PR and QRS 90ms.      Echo 08/28/24: 1. Left ventricular ejection fraction, by estimation, is 45 to 50%. Left   ventricular ejection fraction by 3D volume is 49 %. The left ventricle has  mildly decreased function. Left ventricular endocardial border not  optimally defined to evaluate regional   wall motion. The left ventricular internal cavity size was mildly  dilated. Left ventricular diastolic parameters are consistent with Grade I  diastolic dysfunction (impaired relaxation). The average left ventricular  global longitudinal strain is -14.0 %.   The global longitudinal strain is abnormal.   2. Right ventricular systolic function is normal. The right ventricular  size is normal.   3. The mitral valve is normal in structure. Mild mitral valve  regurgitation. No evidence of mitral stenosis.   4. The aortic valve has an indeterminant number of cusps. Aortic valve  regurgitation is not visualized. No aortic stenosis is present.   5. The inferior vena cava is normal in size with greater than 50%  respiratory variability, suggesting right atrial pressure of 3 mmHg.    Physical Exam:   VS:  BP 118/80 (BP Location: Left Arm, Patient Position: Sitting, Cuff Size: Large)   Pulse 75   Ht 5' 11 (1.803 m)   Wt 285 lb 12.8 oz (129.6 kg)   SpO2 99%   BMI 39.86 kg/m    Wt Readings from Last 3 Encounters:  09/15/24 285 lb 12.8 oz (129.6 kg)  08/28/24 286 lb (129.7 kg)  08/21/24 285 lb 3.2 oz (129.4 kg)     General: Well developed, in no acute distress.  Neck: No JVD.  Cardiac: Normal rate, regular rhythm.  Resp: Normal work of breathing.  Ext: No  edema.  Neuro: No gross focal deficits.  Psych: Normal affect.    ASSESSMENT AND PLAN:    #Chronic systolic heart failure secondary to NICM: Well compensated on exam today. -Most recent LVEF has improved to 45-50% on echocardiogram. Based on this improvement, she does not currently meet criteria for ICD or CCM. I will contact her primary HF physician and referring, Dr. Rolan, to see if repeating cardiac MRI would be of any use.  -Continue GDMT  regimen of carvedilol , empagliflozin , Entresto , spironolactone . Volume management with torsemide  20 mg daily.  Follow up with EP Team as needed.   Signed, Fonda Kitty, MD

## 2024-09-15 ENCOUNTER — Ambulatory Visit: Attending: Cardiology | Admitting: Cardiology

## 2024-09-15 ENCOUNTER — Encounter: Payer: Self-pay | Admitting: Cardiology

## 2024-09-15 VITALS — BP 118/80 | HR 75 | Ht 71.0 in | Wt 285.8 lb

## 2024-09-15 DIAGNOSIS — I5022 Chronic systolic (congestive) heart failure: Secondary | ICD-10-CM | POA: Diagnosis not present

## 2024-09-15 DIAGNOSIS — I428 Other cardiomyopathies: Secondary | ICD-10-CM | POA: Diagnosis not present

## 2024-09-15 NOTE — Patient Instructions (Signed)

## 2024-09-17 ENCOUNTER — Telehealth: Payer: Self-pay | Admitting: Family

## 2024-09-17 ENCOUNTER — Telehealth: Payer: Self-pay

## 2024-09-17 MED ORDER — INSULIN PEN NEEDLE 31G X 8 MM MISC: 3 refills | Status: DC

## 2024-09-17 MED ORDER — INSULIN LISPRO (1 UNIT DIAL) 100 UNIT/ML (KWIKPEN): 5.0000 [IU] | PEN_INJECTOR | Freq: Three times a day (TID) | SUBCUTANEOUS | 11 refills | Status: DC

## 2024-09-17 NOTE — Progress Notes (Signed)
   09/17/2024  Patient ID: Emily Hinton, female   DOB: 1961-10-06, 63 y.o.   MRN: [id number]  Returning missed call/voicemail from patient.  She is in need of a refill on Lantus , Humalog , and pen needles.  Humalog  was sent in for vials, and the pharmacy does not have a prescription for pen needles.  Lantus  solostar pen should have a refill on it- I will contact pharmacy to fill this.  Orders pending for Humalog  Kwikpen and pen needles for PCP to sign if in agreement.  I have a telephone follow-up visit with the patient 12/8  Channing DELENA Mealing, PharmD, DPLA

## 2024-09-17 NOTE — Progress Notes (Unsigned)
 Advanced Heart Failure Clinic Note    PCP: Melvin Pao, NP Cardiology: Dr. Perla HF Cardiology: Dr. Rolan  Chief complaint: shortness of breath   HPI:  [name] is a 63 y.o. female with history of OSA, type 2 diabetes, CSF leak requiring VP shunt, and nonischemic cardiomyopathy was referred by Dr. Gollan for CHF clinic evaluation.  Patient's cardiomyopathy was diagnosed in 2018 in [place], cath at that time showed no significant CAD.  She subsequently relocated to [facility].  She was admitted to [facility] in 4/23 with PNA and CHF, echo showed EF 40-45%.  Coronary CTA done in 5/23 showed no CAD, calcium  score 0.  Cardiac MRI in 10/23 showed EF 32%, no LGE, RV EF 47%.  Echo in 1/24 showed EF 40-45%, mild LV dilation, normal RV, moderate MR, IVC normal.  Patient does not have a strong family history of cardiomyopathy, her father had CHF but developed after he had renal failure requiring HD.  No drinking or drugs.    CPX in 4/24 showed submaximal study with moderate lung restriction, no significant HF limitation, primarily limited by obesity/deconditioning.   Echo in 1/25 showed EF 30-35%, moderate LV dilation, normal RV size/systolic function, moderate-severe MR.   She returns today for a HF follow-up visit with a chief complaint of shortness of breath when climbing stairs or walking long distance. Has associated fatigue, occasional palpitations, dizziness when bending over, occasional pedal edema more so on the left leg. Decreased appetite, tends to drink more than eat some times. Has been drinking juices. Occasional headaches, worse with stressful events. Not sleeping well and has been unable to tolerate CPAP. Chronic weakness in left leg.   Father recently passed away end of Sep 02, 2024 and she admits that she's been under a lot of stress during these last couple of months.  Continues to voice frustration with her inability to lose weight and the fact that she continues to gain weight.   Labs  (10/23): LDL 91, K 4.4, creatinine 9.06 Labs (2/24): LDL 41, BNP 38, K 3.7, creatinine 1.11 Labs (5/24): K 3.8, creatinine 1.12 Labs (11/24): LDL 42 Labs (3/25): K 4.6, creatinine 1.04 Labs (9/25): K 4.7=>5.2, creatinine 1.02, glucose 302=>320, LFT's normal, BNP 17.4, LDL 54, A1c 15.4%  ECG (9/25, personally reviewed): NSR, LVH with repolarization abnormality.   PMH: 1. OSA: Does not tolerate CPAP.  2. Type 2 diabetes 3. CSF leak: Repairs in 2014 and 2017, VP shunt placed.  4. H/o spinal fusion 5. Chronic systolic CHF: nonischemic cardiomyopathy.  Diagnosed in [place] around 2018.  - Echo (2018): EF 45%.  - LHC (2018): No significant CAD.  - Echo (8/19): EF 45% - Echo (7/22): EF 55% - Echo (4/23): EF 40-45%, normal RV.  - Coronary CTA (5/23): Coronary artery calcium  score 0, no CAD.  - Echo (9/23): EF 35-40%, normal RV - cMRI (10/23): EF 32%, no LGE, RV EF 47%.  - Echo (1/24): EF 40-45%, mild LV dilation, normal RV, moderate MR, IVC normal.  - CPX (4/24): Submaximal with moderate lung restriction in setting of obesity, no significant HF limitation but she has a moderate functional limitation due to obesity/deconditioning.  - Echo (1/25): EF 30-35%, moderate LV dilation, normal RV size/systolic function, moderate-severe MR.  - Echo (11/25): EF 45-50%, G1DD, normal RV, mild MR 6. ABIs (8/24) normal  SH: From [place], former [employer] now on disability.  No smoking, ETOH, drugs.   FH: Father with ESRD and CHF, siblings with no cardiac issues.   ROS: All  systems reviewed and negative except as per HPI.   Current Outpatient Medications  Medication Sig Dispense Refill   acetaminophen  (TYLENOL ) 500 MG tablet Take 500 mg by mouth every 6 (six) hours as needed.     aspirin  81 MG chewable tablet Chew 1 tablet (81 mg total) by mouth daily. 90 tablet 3   carvedilol  (COREG ) 25 MG tablet Take 1 tablet (25 mg total) by mouth 2 (two) times daily. 180 tablet 3   citalopram  (CELEXA ) 20 MG tablet Take 1 tablet  (20 mg total) by mouth daily. 90 tablet 1   conjugated estrogens  (PREMARIN ) vaginal cream Place 0.25 Applicatorfuls vaginally daily. Use nightly for 2 weeks, then twice a week as needed. 42.5 g 12   empagliflozin  (JARDIANCE ) 25 MG TABS tablet Take 1 tablet (25 mg total) by mouth daily before breakfast. To replace 10mg - place on hold until patient requests please 90 tablet 1   gabapentin  (NEURONTIN ) 300 MG capsule TAKE 1 CAPSULE(300 MG) BY MOUTH THREE TIMES DAILY 270 capsule 1   insulin  glargine (LANTUS  SOLOSTAR) 100 UNIT/ML Solostar Pen Inject 60 Units into the skin at bedtime. 18 mL 1   insulin  lispro (HUMALOG  KWIKPEN) 100 UNIT/ML KwikPen Inject 5 Units into the skin 3 (three) times daily. With meals.  Please dispense pens, not vials. 15 mL 11   Insulin  Pen Needle 31G X 8 MM MISC Use to inject insulin  4 times daily- please dispense cheapest alternative on patient's insurance 400 each 3   metFORMIN  (GLUCOPHAGE ) 1000 MG tablet Take 1 tablet (1,000 mg total) by mouth 2 (two) times daily with a meal. 180 tablet 2   metroNIDAZOLE  (FLAGYL ) 500 MG tablet Take 1 tablet (500 mg total) by mouth 2 (two) times daily. 14 tablet 0   omeprazole  (PRILOSEC) 20 MG capsule TAKE 1 CAPSULE(20 MG) BY MOUTH DAILY 90 capsule 1   rosuvastatin  (CRESTOR ) 10 MG tablet TAKE 1 TABLET(10 MG) BY MOUTH DAILY 90 tablet 0   sacubitril -valsartan  (ENTRESTO ) 97-103 MG Take 1 tablet by mouth 2 (two) times daily. [employer] secondary to insurance, please: ID [id number] BIN [id number] PCN [id number] Group [id number] 60 tablet 0   spironolactone  (ALDACTONE ) 25 MG tablet TAKE 1 TABLET(25 MG) BY MOUTH DAILY 90 tablet 1   tirzepatide  (MOUNJARO ) 5 MG/0.5ML Pen Inject 5 mg into the skin once a week. 6 mL 1   torsemide  (DEMADEX ) 20 MG tablet Take 2 tablets (40 mg total) by mouth daily for 3 days, THEN 1 tablet (20 mg total) daily. (Patient taking differently: Take 1 tablet (20 mg total) daily.) 36 tablet 6   triamcinolone  cream (KENALOG ) 0.1 %  Apply 1 Application topically 2 (two) times daily. 30 g 1   Vericiguat  2.5 MG TABS Take 1 tablet (2.5 mg total) by mouth daily. 30 tablet 5   No current facility-administered medications for this visit.   Vitals:   09/18/24 0949  BP: 132/85  Pulse: 78  SpO2: 100%  Weight: 288 lb 3.2 oz (130.7 kg)   Wt Readings from Last 3 Encounters:  09/18/24 288 lb 3.2 oz (130.7 kg)  09/15/24 285 lb 12.8 oz (129.6 kg)  08/28/24 286 lb (129.7 kg)   Lab Results  Component Value Date   CREATININE 1.02 (H) 07/08/2024   CREATININE 1.01 (H) 07/03/2024   CREATININE 1.04 (H) 12/27/2023   Physical Exam: General: Well appearing.  Cor: No JVD. Regular rhythm, rate.  Lungs: clear Abdomen: soft, nontender, nondistended. Extremities: no edema Neuro:. Affect pleasant   Assessment/Plan: 1.  Chronic systolic CHF: Nonischemic cardiomyopathy.  Cath in 2018 and coronary CTA in 5/23 showed no coronary disease.  Cardiac MRI in 10/23 showed  EF 32%, no LGE, RV EF 47%, and echo in 1/24 showed EF 40-45%, mild LV dilation, normal RV, moderate MR, IVC normal.  She does not have a history of ETOH or drug abuse.  She does not have a strong family history of cardiomyopathy.  CPX in 4/24 suggests that her predominant limitation is obesity/deconditioning. Echo in 1/25 showed EF 30-35%, moderate LV dilation, normal RV size/systolic function, moderate-severe MR. Echo 11/25: EF 45-50%, G1DD, normal RV, mild MR   - Euvolemic although continues to have NYHA Hinton III symptoms.   - Continue torsemide  20 mg daily - Continue Coreg  25 mg bid.  - Continue empagliflozin  25mg  daily (DM dose) - Continue Entresto  97/103 bid - Continue spironolactone  25 daily.  - Would not add Bidil given chronic headaches.  - Will increase vericiguat  to 5mg  daily. EF has improved since starting this medication. - Saw EP 12/25 & since EF has improved, she currently doesn't meet criteria for ICD or CCM. Could consider repeating cMRI - will refer to  cardiac rehab as I feel like some of her symptoms are related to deconditioning 2. Left leg pain/weakness: With ambulation it is worse.  ABIs normal. Could be sciatica.  - Followup with PCP for evaluation.   3. OSA:  Has not been able to tolerate CPAP.  4. Obesity: Frustrated with lack of weight loss  - Will increase her mounjaro  to 7.5mg  weekly - Cardiac rehab referral placed 5. Mitral regurgitation: Moderate-severe MR on 1/25 echo.  This is worse than the past.  Suspect functional MR.  - MR mild on 11/25 echo 6: DM (managed by PCP)- - A1c 07/08/24 was 15.4%. Back in 02/25 it was 6.5%   Return in 6 weeks, sooner if needed.   I spent 35 minutes reviewing records, interviewing/ examing patient and managing plan/ orders.   Emily DELENA Class, FNP 09/17/24

## 2024-09-17 NOTE — Telephone Encounter (Signed)
 Called to confirm/remind patient of their appointment at the Advanced Heart Failure Clinic on 09/18/24.   Appointment:   [] Confirmed  [x] Left mess   [] No answer/No voice mail  [] VM Full/unable to leave message  [] Phone not in service  Patient reminded to bring all medications and/or complete list.  Confirmed patient has transportation. Gave directions, instructed to utilize valet parking.

## 2024-09-18 ENCOUNTER — Encounter: Payer: Self-pay | Admitting: Family

## 2024-09-18 ENCOUNTER — Ambulatory Visit: Attending: Family | Admitting: Family

## 2024-09-18 VITALS — BP 132/85 | HR 78 | Wt 288.2 lb

## 2024-09-18 DIAGNOSIS — I5022 Chronic systolic (congestive) heart failure: Secondary | ICD-10-CM

## 2024-09-18 DIAGNOSIS — G4733 Obstructive sleep apnea (adult) (pediatric): Secondary | ICD-10-CM

## 2024-09-18 DIAGNOSIS — Z6838 Body mass index (BMI) 38.0-38.9, adult: Secondary | ICD-10-CM

## 2024-09-18 DIAGNOSIS — M79606 Pain in leg, unspecified: Secondary | ICD-10-CM

## 2024-09-18 DIAGNOSIS — I340 Nonrheumatic mitral (valve) insufficiency: Secondary | ICD-10-CM

## 2024-09-18 DIAGNOSIS — E1165 Type 2 diabetes mellitus with hyperglycemia: Secondary | ICD-10-CM

## 2024-09-18 MED ORDER — VERICIGUAT 5 MG PO TABS: 5.0000 mg | ORAL_TABLET | Freq: Every day | ORAL | 11 refills | Status: DC | PRN

## 2024-09-18 MED ORDER — MOUNJARO 7.5 MG/0.5ML ~~LOC~~ SOAJ: 7.5000 mg | SUBCUTANEOUS | 2 refills | Status: AC

## 2024-09-18 NOTE — Patient Instructions (Signed)
 Medication Changes:  INCREASE Mounjaro  to 7.5mg  injection weekly  INCREASE Verciguat to 5mg  (1 tab) daily   Referrals:  You have been referred to [facility]. They will contact you in order to schedule an appointment.    Follow-Up in: Please follow up with the Advanced Heart Failure Clinic in 6 weeks with Ellouise Class, FNP.   Thank you for choosing [facility] Failure [facility].    At the Advanced Heart Failure Clinic, you and your health needs are our priority. We have a designated team specialized in the treatment of Heart Failure. This Care Team includes your primary Heart Failure Specialized Cardiologist (physician), Advanced Practice Providers (APPs- Physician Assistants and Nurse Practitioners), and Pharmacist who all work together to provide you with the care you need, when you need it.   You may see any of the following providers on your designated Care Team at your next follow up:  Dr. Toribio Fuel Dr. Ezra Shuck Dr. Ria Commander Dr. Morene Brownie Ellouise Class, FNP Jaun Bash, RPH-CPP  Please be sure to bring in all your medications bottles to every appointment.   Need to Contact Us :  If you have any questions or concerns before your next appointment please send us  a message through [place] or call our office at [phone].    TO LEAVE A MESSAGE FOR THE NURSE SELECT OPTION 2, PLEASE LEAVE A MESSAGE INCLUDING: YOUR NAME DATE OF BIRTH CALL BACK NUMBER REASON FOR CALL**this is important as we prioritize the call backs  YOU WILL RECEIVE A CALL BACK THE SAME DAY AS LONG AS YOU CALL BEFORE 4:00 PM

## 2024-09-21 ENCOUNTER — Other Ambulatory Visit: Payer: Self-pay

## 2024-09-21 DIAGNOSIS — I10 Essential (primary) hypertension: Secondary | ICD-10-CM

## 2024-09-21 DIAGNOSIS — I5020 Unspecified systolic (congestive) heart failure: Secondary | ICD-10-CM

## 2024-09-21 DIAGNOSIS — E1169 Type 2 diabetes mellitus with other specified complication: Secondary | ICD-10-CM

## 2024-09-21 NOTE — Progress Notes (Signed)
   09/21/2024 Name: Zorina Mallin MRN: [id number] DOB: 01/11/1961  No chief complaint on file.  Reeve Morrison- Shawl is a 63 y.o. year old female who presented for a telephone follow-up visit.   Subjective:  Care Team: Primary Care Provider: Melvin Pao, NP ; Next Scheduled Visit: 1/9  Diabetes: Current medications: Mounjaro  5mg  weekly, Jardiance  25mg  daily, Lantus  50 units at bedtime, Hunalog 5 units TID with meals, metformin  1000mg  BID -Cardiology is increasing Mounjaro  to 7.5mg  weekly- patient will pick this dose up today and is due for next injection Friday -Current glucose readings: FBG 125-135, 95-110 in the afternoon -Enrolled in [employer] to assist with Jardiance  copay -Pharmacy is currently working on refills for pen needles, Lantus  and Humalog  PENS (patient prefers pens over vials)  Macrovascular and Microvascular Risk Reduction:  Statin? yes (rosuvastatin  10mg  daily); ACEi/ARB? yes (Entresto  97/103 BID) Last urinary albumin/creatinine ratio:  Lab Results  Component Value Date   MICRALBCREAT 30-300 (H) 12/07/2022   Last eye exam:  Lab Results  Component Value Date   HMDIABEYEEXA No Retinopathy 08/06/2022   Last foot exam: 08/14/2022 Tobacco Use:  Tobacco Use: Low Risk  (09/18/2024)   Patient History    Smoking Tobacco Use: Never    Smokeless Tobacco Use: Never    Passive Exposure: Not on file   Heart Failure (EF 40-45%):  Current medications:  ACEi/ARB/ARNI: Entresto  97/103mg  BID SGLT2i: Jardiance  25mg  daily Beta blocker: carvedilol  25mg  BID Mineralocorticoid Receptor Antagonist: Spironolactone  25mg  daily Diuretic regimen: Torsemide  20mg  daily  -Vericiguat  recently increased to 5mg  daily by cardiology -Patient enrolled in [facility] to assist with Jardiance  and Entresto  copays -Does monitor home BP and endorses this stays 110-130/60-80  Objective: Lab Results  Component Value Date   HGBA1C 15.4 (H) 07/08/2024    Lab Results  Component Value Date   CREATININE 1.02 (H) 07/08/2024   BUN 10 07/08/2024   NA 136 07/08/2024   K 5.2 07/08/2024   CL 94 (L) 07/08/2024   CO2 25 07/08/2024   Lab Results  Component Value Date   CHOL 127 07/03/2024   HDL 55 07/03/2024   LDLCALC 54 07/03/2024   TRIG 97 07/03/2024   CHOLHDL 2.3 07/03/2024    Assessment/Plan:   Diabetes: -Currently uncontrolled; goal A1c <7%- home BG readings reflect improved control -LDL at goal of <70, BP at goal of <130/80 -Patient will increase to Mounjaro  7.5mg  weekly with her dose this Friday.  Advised her to decrease Lantus  to 45 units to prevent hypoglycemia based on home BG readings.  Continue metformin  1000mg  BID, Jardiance  25mg  daily, and Humalog  5 units TID with meals. -Continue to monitor FBG and post-prandial BG daily -Sees PCP again 1/9 and will be due for A1c, UACR, CMP and lipid panel.  It appears she is also due for a diabetic foot exam.  Heart Failure: -Currently appropriately managed -Continue current regimen and regular follow-up with cardiology -Continue to monitor home BP and weight regularly  Follow Up Plan: 3 weeks  Channing DELENA Mealing, PharmD, DPLA

## 2024-09-23 ENCOUNTER — Other Ambulatory Visit: Payer: Self-pay | Admitting: *Deleted

## 2024-09-23 DIAGNOSIS — I5022 Chronic systolic (congestive) heart failure: Secondary | ICD-10-CM

## 2024-09-26 ENCOUNTER — Other Ambulatory Visit: Payer: Self-pay

## 2024-09-28 MED ORDER — INSULIN LISPRO (1 UNIT DIAL) 100 UNIT/ML (KWIKPEN): 5.0000 [IU] | PEN_INJECTOR | Freq: Three times a day (TID) | SUBCUTANEOUS | 1 refills | Status: AC

## 2024-09-28 MED ORDER — INSULIN PEN NEEDLE 31G X 8 MM MISC: 3 refills | Status: AC

## 2024-09-30 NOTE — Progress Notes (Signed)
 Emily Hinton                                          MRN: [id number]   09/30/2024   The [facility] Quality Team Specialist reviewed this patient medical record for the purposes of chart review for care gap closure. The following were reviewed: chart review for care gap closure-glycemic status assessment.    [facility]BCI Quality Team

## 2024-09-30 NOTE — Progress Notes (Signed)
 Emily Hinton                                          MRN: [id number]   09/30/2024   The [facility]BCI Quality Team Specialist reviewed this patient medical record for the purposes of chart review for care gap closure. The following were reviewed: chart review for care gap closure-kidney health evaluation for diabetes:eGFR  and uACR.    [facility]BCI Quality Team

## 2024-10-05 ENCOUNTER — Encounter: Payer: Self-pay | Admitting: *Deleted

## 2024-10-16 ENCOUNTER — Other Ambulatory Visit: Payer: Self-pay

## 2024-10-16 ENCOUNTER — Other Ambulatory Visit (INDEPENDENT_AMBULATORY_CARE_PROVIDER_SITE_OTHER): Payer: Self-pay

## 2024-10-16 ENCOUNTER — Encounter: Attending: Cardiology | Admitting: *Deleted

## 2024-10-16 ENCOUNTER — Ambulatory Visit: Admitting: Nurse Practitioner

## 2024-10-16 DIAGNOSIS — I5020 Unspecified systolic (congestive) heart failure: Secondary | ICD-10-CM

## 2024-10-16 DIAGNOSIS — Z794 Long term (current) use of insulin: Secondary | ICD-10-CM

## 2024-10-16 DIAGNOSIS — I10 Essential (primary) hypertension: Secondary | ICD-10-CM

## 2024-10-16 DIAGNOSIS — I5022 Chronic systolic (congestive) heart failure: Secondary | ICD-10-CM | POA: Insufficient documentation

## 2024-10-16 NOTE — Progress Notes (Signed)
" ° °  10/16/2024 Name: Emily Hinton MRN: [id number] DOB: April 11, 1961  Chief Complaint  Patient presents with   Diabetes Management Plan   Emily Hinton is a 64 y.o. year old female who presented for a telephone follow-up visit.   Subjective:  Care Team: Primary Care Provider: Melvin Pao, NP ; Next Scheduled Visit: 1/9  Diabetes: Current medications: Mounjaro  7.5mg  weekly, Jardiance  25mg  daily, Lantus  60 units at bedtime, Hunalog 5 units TID with meals, metformin  1000mg  BID -Patient recently increased to Mounjaro  7.5mg  weekly by cardiology- she endorses tolerating this dose well so far -Current glucose readings: FBG 125-135, 95-110 in the afternoon -Enrolled in [employer] to assist with Jardiance  copay -Does not endorse any s/sx of hypoglycemia or hyperglycemia  Macrovascular and Microvascular Risk Reduction:  Statin? yes (rosuvastatin  10mg  daily); ACEi/ARB? yes (Entresto  97/103 BID) Last urinary albumin/creatinine ratio:  Lab Results  Component Value Date   MICRALBCREAT 30-300 (H) 12/07/2022   Last eye exam:  Lab Results  Component Value Date   HMDIABEYEEXA No Retinopathy 08/06/2022   Last foot exam: 08/14/2022 Tobacco Use:  Tobacco Use: Low Risk (09/18/2024)   Patient History    Smoking Tobacco Use: Never    Smokeless Tobacco Use: Never    Passive Exposure: Not on file   Heart Failure (EF 40-45%): Current medications:  ACEi/ARB/ARNI: Entresto  97/103mg  BID SGLT2i: Jardiance  25mg  daily Beta blocker: carvedilol  25mg  BID Mineralocorticoid Receptor Antagonist: Spironolactone  25mg  daily on medication list, but this has not been filled in 2025 Diuretic regimen: Torsemide  20mg  daily  -Vericiguat  recently increased to 5mg  daily by cardiology -Patient enrolled in [facility] to assist with Jardiance  and Entresto  copays -Does monitor home BP and endorses this stays 110-130/60-80  Objective: Lab Results  Component Value Date    HGBA1C 15.4 (H) 07/08/2024   Lab Results  Component Value Date   CREATININE 1.02 (H) 07/08/2024   BUN 10 07/08/2024   NA 136 07/08/2024   K 5.2 07/08/2024   CL 94 (L) 07/08/2024   CO2 25 07/08/2024   Lab Results  Component Value Date   CHOL 127 07/03/2024   HDL 55 07/03/2024   LDLCALC 54 07/03/2024   TRIG 97 07/03/2024   CHOLHDL 2.3 07/03/2024   Assessment/Plan:   Diabetes: -Currently uncontrolled; goal A1c <7%- home BG readings reflect improved control -LDL at goal of <70, BP at goal of <130/80 -Continue Mounjaro  7.5mg  weekly, metformin  1000mg  BID, Jardiance  25mg  daily, and Humalog  5 units TID with meals at this time -Continue to monitor FBG and post-prandial BG daily -Sees PCP again 1/9 and will be due for A1c, UACR, CMP and lipid panel.  It appears she is also due for a diabetic foot exam. -Consider increasing Mounjaro  to 10mg  weekly if patient continues to tolerate 7.5mg  dose well; goal to decrease Lantus  dose  Heart Failure: -Patient sees cardiology again 1/16- sending a message to let them know patient appears to not be taking spironolactone , and a new prescription will need to be sent in if she is to resume therapy -Continue to monitor home BP and weight regularly  Follow Up Plan: 3 months  Channing DELENA Mealing, PharmD, DPLA    "

## 2024-10-16 NOTE — Progress Notes (Unsigned)
" ° °  10/16/2024 Name: Emily Hinton MRN: [id number] DOB: [birth year]  Outreach attempt for scheduled telephone visit was not successful.  Tried to call x2 and left a HIPAA compliant voicemail with my direct phone number.  Sending a MyChart message to attempt to reschedule and will try to call patient again next week if I do not hear back.  Channing DELENA Mealing, PharmD, DPLA    "

## 2024-10-16 NOTE — Progress Notes (Signed)
 Initial phone call completed. Diagnosis can be found in [facility] 12/5. EP Orientation scheduled for Wednesday 1/7 1:30 pm.

## 2024-10-19 ENCOUNTER — Telehealth: Payer: Self-pay

## 2024-10-19 DIAGNOSIS — Z599 Problem related to housing and economic circumstances, unspecified: Secondary | ICD-10-CM

## 2024-10-19 NOTE — Progress Notes (Signed)
" ° °  10/19/2024  Patient ID: Emily Hinton, female   DOB: 1960/11/19, 64 y.o.   MRN: [id number]  [id number] pending for PCP to sign if in agreement.  Patient requesting assistance from social work ASAP in regard to housing stressors.  Channing DELENA Mealing, PharmD, DPLA  "

## 2024-10-20 ENCOUNTER — Other Ambulatory Visit: Payer: Self-pay | Admitting: Nurse Practitioner

## 2024-10-20 DIAGNOSIS — Z794 Long term (current) use of insulin: Secondary | ICD-10-CM

## 2024-10-21 ENCOUNTER — Encounter

## 2024-10-21 VITALS — Ht 70.4 in | Wt 286.8 lb

## 2024-10-21 DIAGNOSIS — I5022 Chronic systolic (congestive) heart failure: Secondary | ICD-10-CM | POA: Diagnosis present

## 2024-10-21 NOTE — Progress Notes (Signed)
 Pulmonary Individual Treatment Plan  Patient Details  Name: Emily Hinton MRN: [id number] Date of Birth: 1961/01/06 Referring Provider:   Flowsheet Row Pulmonary Rehab from 10/21/2024 in [facility] Cardiac and Pulmonary Rehab  Referring Provider Rolan Barrack, MD    Initial Encounter Date:  Flowsheet Row Pulmonary Rehab from 10/21/2024 in [facility] Cardiac and Pulmonary Rehab  Date 10/21/24    Visit Diagnosis: Heart failure, chronic systolic (HCC)  Patient's Home Medications on Admission: Current Medications[1]  Past Medical History: Past Medical History:  Diagnosis Date   CHF (congestive heart failure) (HCC)    CSF leak    Diabetes mellitus without complication (HCC)    DM2 (diabetes mellitus, type 2) (HCC)    Essential hypertension    Heart attack (HCC)    Hyperlipidemia    Morbid obesity (HCC)    NICM (nonischemic cardiomyopathy) (HCC)    Nonobstructive atherosclerosis of coronary artery    OSA (obstructive sleep apnea)     Tobacco Use: Tobacco Use History[2]  Labs: Review Flowsheet  More data exists      Latest Ref Rng & Units 03/08/2023 08/16/2023 11/18/2023 07/03/2024 07/08/2024  Labs for ITP Cardiac and Pulmonary Rehab  Cholestrol 100 - 199 mg/dL 899  891  - 872  -  LDL (calc) 0 - 99 mg/dL 35  42  - 54  -  HDL-C >39 mg/dL 47  48  - 55  -  Trlycerides 0 - 149 mg/dL 95  97  - 97  -  Hemoglobin A1c 4.8 - 5.6 % 6.4  6.7  6.5  - 15.4      Pulmonary Assessment Scores:  Pulmonary Assessment Scores     Row Name 10/21/24 1538         mMRC Score   mMRC Score 3        UCSD: Self-administered rating of dyspnea associated with activities of daily living (ADLs) 6-point scale (0 = not at all to 5 = maximal or unable to do because of breathlessness)  Scoring Scores range from 0 to 120.  Minimally important difference is 5 units  CAT: CAT can identify the health impairment of COPD patients and is better correlated with disease progression.  CAT has a  scoring range of zero to 40. The CAT score is classified into four groups of low (less than 10), medium (10 - 20), high (21-30) and very high (31-40) based on the impact level of disease on health status. A CAT score over 10 suggests significant symptoms.  A worsening CAT score could be explained by an exacerbation, poor medication adherence, poor inhaler technique, or progression of COPD or comorbid conditions.  CAT MCID is 2 points  mMRC: mMRC (Modified Medical Research Council) Dyspnea Scale is used to assess the degree of baseline functional disability in patients of respiratory disease due to dyspnea. No minimal important difference is established. A decrease in score of 1 point or greater is considered a positive change.   Pulmonary Function Assessment:   Exercise Target Goals: Exercise Program Goal: Individual exercise prescription set using results from initial 6 min walk test and THRR while considering  patients activity barriers and safety.   Exercise Prescription Goal: Initial exercise prescription builds to 30-45 minutes a day of aerobic activity, 2-3 days per week.  Home exercise guidelines will be given to patient during program as part of exercise prescription that the participant will acknowledge.  Education: Aerobic Exercise: - Group verbal and visual presentation on the components of exercise prescription. Introduces  F.I.T.T principle from ACSM for exercise prescriptions.  Reviews F.I.T.T. principles of aerobic exercise including progression. Written material provided at class time.   Education: Resistance Exercise: - Group verbal and visual presentation on the components of exercise prescription. Introduces F.I.T.T principle from ACSM for exercise prescriptions  Reviews F.I.T.T. principles of resistance exercise including progression. Written material provided at class time.    Education: Exercise & Equipment Safety: - Individual verbal instruction and demonstration of  equipment use and safety with use of the equipment. Flowsheet Row Pulmonary Rehab from 10/21/2024 in [facility] Cardiac and Pulmonary Rehab  Date 10/21/24  Educator MB  Instruction Review Code 1- Verbalizes Understanding    Education: Exercise Physiology & General Exercise Guidelines: - Group verbal and written instruction with models to review the exercise physiology of the cardiovascular system and associated critical values. Provides general exercise guidelines with specific guidelines to those with heart or lung disease.    Education: Flexibility, Balance, Mind/Body Relaxation: - Group verbal and visual presentation with interactive activity on the components of exercise prescription. Introduces F.I.T.T principle from ACSM for exercise prescriptions. Reviews F.I.T.T. principles of flexibility and balance exercise training including progression. Also discusses the mind body connection.  Reviews various relaxation techniques to help reduce and manage stress (i.e. Deep breathing, progressive muscle relaxation, and visualization). Balance handout provided to take home. Written material provided at class time.   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 HR 123 bpm     Max Ex. BP  162/90     2 Minute Post BP 140/88       Interval HR   1 Minute HR 105     2 Minute HR 116     3 Minute HR 121     4 Minute HR 121     5 Minute HR 123     6 Minute HR 123     2 Minute Post HR 87     Interval Heart Rate? Yes       Interval Oxygen   Interval Oxygen? Yes     Baseline Oxygen Saturation % 95 %     1 Minute Oxygen Saturation % 93 %     1 Minute Liters of Oxygen 0 L     2 Minute Oxygen Saturation % 95 %     2 Minute Liters of Oxygen 0 L  3 Minute Oxygen Saturation % 94 %     3 Minute Liters of Oxygen 0 L     4 Minute Oxygen Saturation % 95 %     4 Minute Liters of Oxygen 0 L     5 Minute Oxygen Saturation % 94 %     5 Minute Liters of Oxygen 0 L     6 Minute Oxygen Saturation % 94 %     6 Minute Liters of Oxygen 0 L     2 Minute Post Oxygen Saturation % 96 %     2 Minute Post Liters of Oxygen 0 L       Oxygen Initial Assessment:  Oxygen Initial Assessment - 10/16/24 1338       Home Oxygen   Home Oxygen Device None    Sleep Oxygen Prescription None    Home Exercise Oxygen Prescription None    Home Resting Oxygen Prescription None    Compliance with Home Oxygen Use Yes      Initial 6 min Walk   Oxygen Used None      Program Oxygen Prescription   Program Oxygen Prescription None      Intervention   Short Term Goals To learn and understand importance of monitoring SPO2 with pulse oximeter and demonstrate accurate use of the pulse oximeter.;To learn and demonstrate proper pursed lip breathing techniques or other breathing techniques. ;To learn and understand importance of maintaining oxygen saturations>88%;To learn and demonstrate proper use of respiratory medications    Long  Term Goals Maintenance of O2 saturations>88%;Compliance with respiratory medication;Verbalizes importance of monitoring SPO2 with pulse oximeter and return demonstration;Demonstrates proper use of MDIs;Exhibits proper breathing techniques, such as pursed lip breathing or other method  taught during program session          Oxygen Re-Evaluation:   Oxygen Discharge (Final Oxygen Re-Evaluation):   Initial Exercise Prescription:  Initial Exercise Prescription - 10/21/24 1500       Date of Initial Exercise RX and Referring Provider   Date 10/21/24    Referring Provider Rolan Barrack, MD      Oxygen   Maintain Oxygen Saturation 88% or higher      Recumbant Bike   Level 3    RPM 50    Watts 25    Minutes 15    METs 3.26      NuStep   Level 3    SPM 80    Minutes 15    METs 3.26      REL-XR   Level 2    Speed 50    Minutes 15    METs 3.26      T5 Nustep   Level 3   T6   SPM 80    Minutes 15    METs 3.26      Track   Laps 37    Minutes 15    METs 3.01      Prescription Details   Frequency (times per week) 2    Duration Progress to 30 minutes of continuous aerobic without signs/symptoms of physical distress      Intensity   THRR 40-80% of Max Heartrate 112-142    Ratings of Perceived Exertion 11-13    Perceived Dyspnea 0-4      Progression   Progression Continue to progress workloads to maintain intensity without signs/symptoms of physical distress.      Resistance Training   Training Prescription Yes    Weight 4 lb  Reps 10-15          Perform Capillary Blood Glucose checks as needed.  Exercise Prescription Changes:   Exercise Prescription Changes     Row Name 10/21/24 1500             Response to Exercise   Blood Pressure (Admit) 146/88       Blood Pressure (Exercise) 162/90       Blood Pressure (Exit) 140/88       Heart Rate (Admit) 82 bpm       Heart Rate (Exercise) 123 bpm       Heart Rate (Exit) 87 bpm       Oxygen Saturation (Admit) 95 %       Oxygen Saturation (Exercise) 93 %       Oxygen Saturation (Exit) 96 %       Rating of Perceived Exertion (Exercise) 9       Perceived Dyspnea (Exercise) 3       Symptoms none       Comments results         Progression   Average METs 3.26           Exercise Comments:   Exercise Goals and Review:   Exercise Goals     Row Name 10/21/24 1537             Exercise Goals   Increase Physical Activity Yes       Intervention Provide advice, education, support and counseling about physical activity/exercise needs.;Develop an individualized exercise prescription for aerobic and resistive training based on initial evaluation findings, risk stratification, comorbidities and participant's personal goals.       Expected Outcomes Short Term: Attend rehab on a regular basis to increase amount of physical activity.;Long Term: Exercising regularly at least 3-5 days a week.;Long Term: Add in home exercise to make exercise part of routine and to increase amount of physical activity.       Increase Strength and Stamina Yes       Intervention Provide advice, education, support and counseling about physical activity/exercise needs.;Develop an individualized exercise prescription for aerobic and resistive training based on initial evaluation findings, risk stratification, comorbidities and participant's personal goals.       Expected Outcomes Short Term: Increase workloads from initial exercise prescription for resistance, speed, and METs.;Short Term: Perform resistance training exercises routinely during rehab and add in resistance training at home;Long Term: Improve cardiorespiratory fitness, muscular endurance and strength as measured by increased METs and functional capacity ( )       Able to understand and use rate of perceived exertion (RPE) scale Yes       Intervention Provide education and explanation on how to use RPE scale       Expected Outcomes Short Term: Able to use RPE daily in rehab to express subjective intensity level;Long Term:  Able to use RPE to guide intensity level when exercising independently       Able to understand and use Dyspnea scale Yes       Intervention Provide education and explanation on how to use Dyspnea scale        Expected Outcomes Short Term: Able to use Dyspnea scale daily in rehab to express subjective sense of shortness of breath during exertion;Long Term: Able to use Dyspnea scale to guide intensity level when exercising independently       Knowledge and understanding of Target Heart Rate Range (THRR) Yes       Intervention  Provide education and explanation of THRR including how the numbers were predicted and where they are located for reference       Expected Outcomes Short Term: Able to state/look up THRR;Short Term: Able to use daily as guideline for intensity in rehab;Long Term: Able to use THRR to govern intensity when exercising independently       Able to check pulse independently Yes       Intervention Review the importance of being able to check your own pulse for safety during independent exercise;Provide education and demonstration on how to check pulse in carotid and radial arteries.       Expected Outcomes Short Term: Able to explain why pulse checking is important during independent exercise;Long Term: Able to check pulse independently and accurately       Understanding of Exercise Prescription Yes       Intervention Provide education, explanation, and written materials on patient's individual exercise prescription       Expected Outcomes Short Term: Able to explain program exercise prescription;Long Term: Able to explain home exercise prescription to exercise independently          Exercise Goals Re-Evaluation :   Discharge Exercise Prescription (Final Exercise Prescription Changes):  Exercise Prescription Changes - 10/21/24 1500       Response to Exercise   Blood Pressure (Admit) 146/88    Blood Pressure (Exercise) 162/90    Blood Pressure (Exit) 140/88    Heart Rate (Admit) 82 bpm    Heart Rate (Exercise) 123 bpm    Heart Rate (Exit) 87 bpm    Oxygen Saturation (Admit) 95 %    Oxygen Saturation (Exercise) 93 %    Oxygen Saturation (Exit) 96 %    Rating of Perceived  Exertion (Exercise) 9    Perceived Dyspnea (Exercise) 3    Symptoms none    Comments results      Progression   Average METs 3.26          Nutrition:  Target Goals: Understanding of nutrition guidelines, daily intake of sodium 1500mg , cholesterol 200mg , calories 30% from fat and 7% or less from saturated fats, daily to have 5 or more servings of fruits and vegetables.  Education: Nutrition 1 -Group instruction provided by verbal, written material, interactive activities, discussions, models, and posters to present general guidelines for heart healthy nutrition including macronutrients, label reading, and promoting whole foods over processed counterparts. Education serves as pensions consultant of discussion of heart healthy eating for all. Written material provided at class time.     Education: Nutrition 2 -Group instruction provided by verbal, written material, interactive activities, discussions, models, and posters to present general guidelines for heart healthy nutrition including sodium, cholesterol, and saturated fat. Providing guidance of habit forming to improve blood pressure, cholesterol, and body weight. Written material provided at class time.     Biometrics:  Pre Biometrics - 10/21/24 1537       Pre Biometrics   Height 5' 10.4 (1.788 m)    Weight 286 lb 12.8 oz (130.1 kg)    Waist Circumference 48.5 inches    Hip Circumference 52 inches    Waist to Hip Ratio 0.93 %    BMI (Calculated) 40.69    Single Leg Stand 30 seconds           Nutrition Therapy Plan and Nutrition Goals:  Nutrition Therapy & Goals - 10/21/24 1538       Personal Nutrition Goals   Nutrition Goal Will wait a few  weeks to schedule with RD      Intervention Plan   Intervention Prescribe, educate and counsel regarding individualized specific dietary modifications aiming towards targeted core components such as weight, hypertension, lipid management, diabetes, heart failure and other  comorbidities.;Nutrition handout(s) given to patient.    Expected Outcomes Short Term Goal: Understand basic principles of dietary content, such as calories, fat, sodium, cholesterol and nutrients.;Short Term Goal: A plan has been developed with personal nutrition goals set during dietitian appointment.;Long Term Goal: Adherence to prescribed nutrition plan.          Nutrition Assessments:  MEDIFICTS Score Key: >=70 Need to make dietary changes  40-70 Heart Healthy Diet <= 40 Therapeutic Level Cholesterol Diet   Picture Your Plate Scores: <59 Unhealthy dietary pattern with much room for improvement. 41-50 Dietary pattern unlikely to meet recommendations for good health and room for improvement. 51-60 More healthful dietary pattern, with some room for improvement.  >60 Healthy dietary pattern, although there may be some specific behaviors that could be improved.   Nutrition Goals Re-Evaluation:   Nutrition Goals Discharge (Final Nutrition Goals Re-Evaluation):   Psychosocial: Target Goals: Acknowledge presence or absence of significant depression and/or stress, maximize coping skills, provide positive support system. Participant is able to verbalize types and ability to use techniques and skills needed for reducing stress and depression.   Education: Stress, Anxiety, and Depression - Group verbal and visual presentation to define topics covered.  Reviews how body is impacted by stress, anxiety, and depression.  Also discusses healthy ways to reduce stress and to treat/manage anxiety and depression.  Written material provided at class time.   Education: Sleep Hygiene -Provides group verbal and written instruction about how sleep can affect your health.  Define sleep hygiene, discuss sleep cycles and impact of sleep habits. Review good sleep hygiene tips.    Initial Review & Psychosocial Screening:  Initial Psych Review & Screening - 10/16/24 1347       Initial Review    Current issues with Current Stress Concerns    Source of Stress Concerns Chronic Illness      Family Dynamics   Good Support System? Yes   sons, grandkids     Barriers   Psychosocial barriers to participate in program There are no identifiable barriers or psychosocial needs.;The patient should benefit from training in stress management and relaxation.      Screening Interventions   Interventions Encouraged to exercise;Provide feedback about the scores to participant;To provide support and resources with identified psychosocial needs    Expected Outcomes Short Term goal: Utilizing psychosocial counselor, staff and physician to assist with identification of specific Stressors or current issues interfering with healing process. Setting desired goal for each stressor or current issue identified.;Long Term Goal: Stressors or current issues are controlled or eliminated.;Short Term goal: Identification and review with participant of any Quality of Life or Depression concerns found by scoring the questionnaire.;Long Term goal: The participant improves quality of Life and PHQ9 Scores as seen by post scores and/or verbalization of changes          Quality of Life Scores:  Scores of 19 and below usually indicate a poorer quality of life in these areas.  A difference of  2-3 points is a clinically meaningful difference.  A difference of 2-3 points in the total score of the Quality of Life Index has been associated with significant improvement in overall quality of life, self-image, physical symptoms, and general health in studies assessing change in  quality of life.  PHQ-9: Review Flowsheet  More data exists      10/21/2024 08/21/2024 07/21/2024 07/08/2024 04/03/2024  Depression screen PHQ 2/9  Decreased Interest 2 2 2 1  0  Down, Depressed, Hopeless 2 0 0 0 2  PHQ - 2 Score 4 2 2 1 2   Altered sleeping 3 3 3 3 3   Tired, decreased energy 2 2 2 3 3   Change in appetite 3 2 3 3 3   Feeling bad or failure  about yourself  0 0 2 2 0  Trouble concentrating 0 3 2 2 3   Moving slowly or fidgety/restless 3 0 0 2 0  Suicidal thoughts 1 0 0 0 -  PHQ-9 Score 16 12 14  16  14    Difficult doing work/chores Somewhat difficult Somewhat difficult Somewhat difficult Somewhat difficult -    Details       Data saved with a previous flowsheet row definition        Interpretation of Total Score  Total Score Depression Severity:  1-4 = Minimal depression, 5-9 = Mild depression, 10-14 = Moderate depression, 15-19 = Moderately severe depression, 20-27 = Severe depression   Psychosocial Evaluation and Intervention:  Psychosocial Evaluation - 10/16/24 1411       Psychosocial Evaluation & Interventions   Comments Cordell is coming to pulmonary rehab with heart failure. She states her health is her biggest stress concern. She wants to lose weight so she can help her heart and shortness of breath. Her sons and some extended family live with her, so she has people around her to help when needed. She is looking forward to participating in something that is just for her. When she wants to destress, she watches movies. She does have a sister out of town who she tries to go on trips with when possible, so she is looking forward to increasing her stamina so she can keep doing these things.    Expected Outcomes Short: attend pulmonary rehab for education and exercise Long: Develop and maintain positive self care habits    Continue Psychosocial Services  Follow up required by staff          Psychosocial Re-Evaluation:   Psychosocial Discharge (Final Psychosocial Re-Evaluation):   Education: Education Goals: Education classes will be provided on a weekly basis, covering required topics. Participant will state understanding/return demonstration of topics presented.  Learning Barriers/Preferences:  Learning Barriers/Preferences - 10/16/24 1341       Learning Barriers/Preferences   Learning Barriers None     Learning Preferences None          General Pulmonary Education Topics:  Infection Prevention: - Provides verbal and written material to individual with discussion of infection control including proper hand washing and proper equipment cleaning during exercise session. Flowsheet Row Pulmonary Rehab from 10/21/2024 in [facility] Cardiac and Pulmonary Rehab  Date 10/21/24  Educator MB  Instruction Review Code 1- Verbalizes Understanding    Falls Prevention: - Provides verbal and written material to individual with discussion of falls prevention and safety. Flowsheet Row Pulmonary Rehab from 10/21/2024 in [facility] Cardiac and Pulmonary Rehab  Date 10/21/24  Educator MB  Instruction Review Code 1- Verbalizes Understanding    Chronic Lung Disease Review: - Group verbal instruction with posters, models, PowerPoint presentations and videos,  to review new updates, new respiratory medications, new advancements in procedures and treatments. Providing information on websites and 800 numbers for continued self-education. Includes information about supplement oxygen, available portable oxygen systems, continuous and intermittent  flow rates, oxygen safety, concentrators, and Medicare reimbursement for oxygen. Explanation of Pulmonary Drugs, including class, frequency, complications, importance of spacers, rinsing mouth after steroid MDI's, and proper cleaning methods for nebulizers. Review of basic lung anatomy and physiology related to function, structure, and complications of lung disease. Review of risk factors. Discussion about methods for diagnosing sleep apnea and types of masks and machines for OSA. Includes a review of the use of types of environmental controls: home humidity, furnaces, filters, dust mite/pet prevention, HEPA vacuums. Discussion about weather changes, air quality and the benefits of nasal washing. Instruction on Warning signs, infection symptoms, calling MD promptly, preventive modes, and  value of vaccinations. Review of effective airway clearance, coughing and/or vibration techniques. Emphasizing that all should Create an Action Plan. Written material provided at class time.   AED/CPR: - Group verbal and written instruction with the use of models to demonstrate the basic use of the AED with the basic ABC's of resuscitation.    Tests and Procedures:  - Group verbal and visual presentation and models provide information about basic cardiac anatomy and function. Reviews the testing methods done to diagnose heart disease and the outcomes of the test results. Describes the treatment choices: Medical Management, Angioplasty, or Coronary Bypass Surgery for treating various heart conditions including Myocardial Infarction, Angina, Valve Disease, and Cardiac Arrhythmias.  Written material provided at class time.   Medication Safety: - Group verbal and visual instruction to review commonly prescribed medications for heart and lung disease. Reviews the medication, class of the drug, and side effects. Includes the steps to properly store meds and maintain the prescription regimen.  Written material given at graduation.   Other: -Provides group and verbal instruction on various topics (see comments)   Knowledge Questionnaire Score:    Core Components/Risk Factors/Patient Goals at Admission:  Personal Goals and Risk Factors at Admission - 10/21/24 1538       Core Components/Risk Factors/Patient Goals on Admission    Weight Management Yes;Weight Loss;Obesity    Intervention Weight Management: Develop a combined nutrition and exercise program designed to reach desired caloric intake, while maintaining appropriate intake of nutrient and fiber, sodium and fats, and appropriate energy expenditure required for the weight goal.;Weight Management: Provide education and appropriate resources to help participant work on and attain dietary goals.;Weight Management/Obesity: Establish reasonable  short term and long term weight goals.;Obesity: Provide education and appropriate resources to help participant work on and attain dietary goals.    Admit Weight 286 lb 12.8 oz (130.1 kg)    Goal Weight: Short Term 238 lb (108 kg)    Goal Weight: Long Term 190 lb (86.2 kg)    Expected Outcomes Short Term: Continue to assess and modify interventions until short term weight is achieved;Long Term: Adherence to nutrition and physical activity/exercise program aimed toward attainment of established weight goal;Weight Loss: Understanding of general recommendations for a balanced deficit meal plan, which promotes 1-2 lb weight loss per week and includes a negative energy balance of [phone] kcal/d;Understanding recommendations for meals to include 15-35% energy as protein, 25-35% energy from fat, 35-60% energy from carbohydrates, less than 200mg  of dietary cholesterol, 20-35 gm of total fiber daily;Understanding of distribution of calorie intake throughout the day with the consumption of 4-5 meals/snacks    Diabetes Yes    Intervention Provide education about proper nutrition, including hydration, and aerobic/resistive exercise prescription along with prescribed medications to achieve blood glucose in normal ranges: Fasting glucose 65-99 mg/dL;Provide education about signs/symptoms and action to  take for hypo/hyperglycemia.    Expected Outcomes Short Term: Participant verbalizes understanding of the signs/symptoms and immediate care of hyper/hypoglycemia, proper foot care and importance of medication, aerobic/resistive exercise and nutrition plan for blood glucose control.;Long Term: Attainment of HbA1C < 7%.    Hypertension Yes    Intervention Provide education on lifestyle modifcations including regular physical activity/exercise, weight management, moderate sodium restriction and increased consumption of fresh fruit, vegetables, and low fat dairy, alcohol moderation, and smoking cessation.;Monitor prescription  use compliance.    Expected Outcomes Short Term: Continued assessment and intervention until BP is < 140/60mm HG in hypertensive participants. < 130/74mm HG in hypertensive participants with diabetes, heart failure or chronic kidney disease.;Long Term: Maintenance of blood pressure at goal levels.          Education:Diabetes - Individual verbal and written instruction to review signs/symptoms of diabetes, desired ranges of glucose level fasting, after meals and with exercise. Acknowledge that pre and post exercise glucose checks will be done for 3 sessions at entry of program.   Know Your Numbers and Heart Failure: - Group verbal and visual instruction to discuss disease risk factors for cardiac and pulmonary disease and treatment options.  Reviews associated critical values for Overweight/Obesity, Hypertension, Cholesterol, and Diabetes.  Discusses basics of heart failure: signs/symptoms and treatments.  Introduces Heart Failure Zone chart for action plan for heart failure. Written material provided at class time.   Core Components/Risk Factors/Patient Goals Review:    Core Components/Risk Factors/Patient Goals at Discharge (Final Review):    ITP Comments:  ITP Comments     Row Name 10/16/24 1342 10/21/24 1528         ITP Comments Initial phone call completed. Diagnosis can be found in [facility] 12/5. EP Orientation scheduled for Wednesday 1/7 1:30 pm. Completed and gym orientation for respiratory care services. Initial ITP created and sent for review to Dr. Fuad Aleskerov, Medical Director.         Comments: Initial ITP     [1]  Current Outpatient Medications:    acetaminophen  (TYLENOL ) 500 MG tablet, Take 500 mg by mouth every 6 (six) hours as needed., Disp: , Rfl:    aspirin  81 MG chewable tablet, Chew 1 tablet (81 mg total) by mouth daily., Disp: 90 tablet, Rfl: 3   carvedilol  (COREG ) 25 MG tablet, Take 1 tablet (25 mg total) by mouth 2 (two) times daily., Disp: 180  tablet, Rfl: 3   citalopram  (CELEXA ) 20 MG tablet, Take 1 tablet (20 mg total) by mouth daily., Disp: 90 tablet, Rfl: 1   conjugated estrogens  (PREMARIN ) vaginal cream, Place 0.25 Applicatorfuls vaginally daily. Use nightly for 2 weeks, then twice a week as needed., Disp: 42.5 g, Rfl: 12   empagliflozin  (JARDIANCE ) 25 MG TABS tablet, Take 1 tablet (25 mg total) by mouth daily before breakfast. To replace 10mg - place on hold until patient requests please, Disp: 90 tablet, Rfl: 1   gabapentin  (NEURONTIN ) 300 MG capsule, TAKE 1 CAPSULE(300 MG) BY MOUTH THREE TIMES DAILY, Disp: 270 capsule, Rfl: 1   insulin  glargine (LANTUS  SOLOSTAR) 100 UNIT/ML Solostar Pen, Inject 60 Units into the skin at bedtime., Disp: 18 mL, Rfl: 1   insulin  lispro (HUMALOG  KWIKPEN) 100 UNIT/ML KwikPen, Inject 5 Units into the skin 3 (three) times daily. With meals.  Please dispense pens, not vials., Disp: 15 mL, Rfl: 1   Insulin  Pen Needle 31G X 8 MM MISC, Use to inject insulin  4 times daily- please dispense cheapest alternative on  patient's insurance, Disp: 400 each, Rfl: 3   metFORMIN  (GLUCOPHAGE ) 1000 MG tablet, Take 1 tablet (1,000 mg total) by mouth 2 (two) times daily with a meal., Disp: 180 tablet, Rfl: 2   metroNIDAZOLE  (FLAGYL ) 500 MG tablet, Take 1 tablet (500 mg total) by mouth 2 (two) times daily., Disp: 14 tablet, Rfl: 0   omeprazole  (PRILOSEC) 20 MG capsule, TAKE 1 CAPSULE(20 MG) BY MOUTH DAILY, Disp: 90 capsule, Rfl: 1   rosuvastatin  (CRESTOR ) 10 MG tablet, TAKE 1 TABLET(10 MG) BY MOUTH DAILY, Disp: 90 tablet, Rfl: 0   sacubitril -valsartan  (ENTRESTO ) 97-103 MG, Take 1 tablet by mouth 2 (two) times daily. [employer] grant secondary to insurance, please: ID [id number] BIN [id number] PCN [id number] Group [id number], Disp: 60 tablet, Rfl: 0   spironolactone  (ALDACTONE ) 25 MG tablet, TAKE 1 TABLET(25 MG) BY MOUTH DAILY (Patient not taking: Reported on 10/16/2024), Disp: 90 tablet, Rfl: 1   tirzepatide  (MOUNJARO ) 7.5 MG/0.5ML Pen,  Inject 7.5 mg into the skin once a week., Disp: 2 mL, Rfl: 2   torsemide  (DEMADEX ) 20 MG tablet, Take 2 tablets (40 mg total) by mouth daily for 3 days, THEN 1 tablet (20 mg total) daily. (Patient taking differently: Take 1 tablet (20 mg total) daily.), Disp: 36 tablet, Rfl: 6   triamcinolone  cream (KENALOG ) 0.1 %, Apply 1 Application topically 2 (two) times daily., Disp: 30 g, Rfl: 1   Vericiguat  2.5 MG TABS, Take 1 tablet (2.5 mg total) by mouth daily., Disp: 30 tablet, Rfl: 5   Vericiguat  5 MG TABS, Take 1 tablet (5 mg total) by mouth daily as needed., Disp: 30 tablet, Rfl: 11 [2]  Social History Tobacco Use  Smoking Status Never  Smokeless Tobacco Never

## 2024-10-21 NOTE — Telephone Encounter (Signed)
 I called the patient to see if she had any of the Jardiance  left. The prescription was written in September for 90 tablets with 1 refill. If she calls back in it is ok for E2C2 to speak with her about this.

## 2024-10-21 NOTE — Patient Instructions (Signed)
 Patient Instructions  Patient Details  Name: Emily Hinton MRN: [id number] Date of Birth: 1961/08/10 Referring Provider:  Rolan Ezra RAMAN, MD  Below are your personal goals for exercise, nutrition, and risk factors. Our goal is to help you stay on track towards obtaining and maintaining these goals. We will be discussing your progress on these goals with you throughout the program.  Initial Exercise Prescription:  Initial Exercise Prescription - 10/21/24 1500       Date of Initial Exercise RX and Referring Provider   Date 10/21/24    Referring Provider Rolan Ezra, MD      Oxygen   Maintain Oxygen Saturation 88% or higher      Recumbant Bike   Level 3    RPM 50    Watts 25    Minutes 15    METs 3.26      NuStep   Level 3    SPM 80    Minutes 15    METs 3.26      REL-XR   Level 2    Speed 50    Minutes 15    METs 3.26      T5 Nustep   Level 3   T6   SPM 80    Minutes 15    METs 3.26      Track   Laps 37    Minutes 15    METs 3.01      Prescription Details   Frequency (times per week) 2    Duration Progress to 30 minutes of continuous aerobic without signs/symptoms of physical distress      Intensity   THRR 40-80% of Max Heartrate 112-142    Ratings of Perceived Exertion 11-13    Perceived Dyspnea 0-4      Progression   Progression Continue to progress workloads to maintain intensity without signs/symptoms of physical distress.      Resistance Training   Training Prescription Yes    Weight 4 lb    Reps 10-15          Exercise Goals: Frequency: Be able to perform aerobic exercise two to three times per week in program working toward 2-5 days per week of home exercise.  Intensity: Work with a perceived exertion of 11 (fairly light) - 15 (hard) while following your exercise prescription.  We will make changes to your prescription with you as you progress through the program.   Duration: Be able to do 30 to 45 minutes of  continuous aerobic exercise in addition to a 5 minute warm-up and a 5 minute cool-down routine.   Nutrition Goals: Your personal nutrition goals will be established when you do your nutrition analysis with the dietician.  The following are general nutrition guidelines to follow: Cholesterol < 200mg /day Sodium < 1500mg /day Fiber: Women over 50 yrs - 21 grams per day  Personal Goals:  Personal Goals and Risk Factors at Admission - 10/21/24 1538       Core Components/Risk Factors/Patient Goals on Admission    Weight Management Yes;Weight Loss;Obesity    Intervention Weight Management: Develop a combined nutrition and exercise program designed to reach desired caloric intake, while maintaining appropriate intake of nutrient and fiber, sodium and fats, and appropriate energy expenditure required for the weight goal.;Weight Management: Provide education and appropriate resources to help participant work on and attain dietary goals.;Weight Management/Obesity: Establish reasonable short term and long term weight goals.;Obesity: Provide education and appropriate resources to help participant work on and attain dietary  goals.    Admit Weight 286 lb 12.8 oz (130.1 kg)    Goal Weight: Short Term 238 lb (108 kg)    Goal Weight: Long Term 190 lb (86.2 kg)    Expected Outcomes Short Term: Continue to assess and modify interventions until short term weight is achieved;Long Term: Adherence to nutrition and physical activity/exercise program aimed toward attainment of established weight goal;Weight Loss: Understanding of general recommendations for a balanced deficit meal plan, which promotes 1-2 lb weight loss per week and includes a negative energy balance of [phone] kcal/d;Understanding recommendations for meals to include 15-35% energy as protein, 25-35% energy from fat, 35-60% energy from carbohydrates, less than 200mg  of dietary cholesterol, 20-35 gm of total fiber daily;Understanding of distribution of  calorie intake throughout the day with the consumption of 4-5 meals/snacks    Diabetes Yes    Intervention Provide education about proper nutrition, including hydration, and aerobic/resistive exercise prescription along with prescribed medications to achieve blood glucose in normal ranges: Fasting glucose 65-99 mg/dL;Provide education about signs/symptoms and action to take for hypo/hyperglycemia.    Expected Outcomes Short Term: Participant verbalizes understanding of the signs/symptoms and immediate care of hyper/hypoglycemia, proper foot care and importance of medication, aerobic/resistive exercise and nutrition plan for blood glucose control.;Long Term: Attainment of HbA1C < 7%.    Hypertension Yes    Intervention Provide education on lifestyle modifcations including regular physical activity/exercise, weight management, moderate sodium restriction and increased consumption of fresh fruit, vegetables, and low fat dairy, alcohol moderation, and smoking cessation.;Monitor prescription use compliance.    Expected Outcomes Short Term: Continued assessment and intervention until BP is < 140/43mm HG in hypertensive participants. < 130/79mm HG in hypertensive participants with diabetes, heart failure or chronic kidney disease.;Long Term: Maintenance of blood pressure at goal levels.          Tobacco Use Initial Evaluation: Social History   Tobacco Use  Smoking Status Never  Smokeless Tobacco Never    Exercise Goals and Review:  Exercise Goals     Row Name 10/21/24 1537             Exercise Goals   Increase Physical Activity Yes       Intervention Provide advice, education, support and counseling about physical activity/exercise needs.;Develop an individualized exercise prescription for aerobic and resistive training based on initial evaluation findings, risk stratification, comorbidities and participant's personal goals.       Expected Outcomes Short Term: Attend rehab on a regular basis  to increase amount of physical activity.;Long Term: Exercising regularly at least 3-5 days a week.;Long Term: Add in home exercise to make exercise part of routine and to increase amount of physical activity.       Increase Strength and Stamina Yes       Intervention Provide advice, education, support and counseling about physical activity/exercise needs.;Develop an individualized exercise prescription for aerobic and resistive training based on initial evaluation findings, risk stratification, comorbidities and participant's personal goals.       Expected Outcomes Short Term: Increase workloads from initial exercise prescription for resistance, speed, and METs.;Short Term: Perform resistance training exercises routinely during rehab and add in resistance training at home;Long Term: Improve cardiorespiratory fitness, muscular endurance and strength as measured by increased METs and functional capacity ( )       Able to understand and use rate of perceived exertion (RPE) scale Yes       Intervention Provide education and explanation on how to use RPE scale  Expected Outcomes Short Term: Able to use RPE daily in rehab to express subjective intensity level;Long Term:  Able to use RPE to guide intensity level when exercising independently       Able to understand and use Dyspnea scale Yes       Intervention Provide education and explanation on how to use Dyspnea scale       Expected Outcomes Short Term: Able to use Dyspnea scale daily in rehab to express subjective sense of shortness of breath during exertion;Long Term: Able to use Dyspnea scale to guide intensity level when exercising independently       Knowledge and understanding of Target Heart Rate Range (THRR) Yes       Intervention Provide education and explanation of THRR including how the numbers were predicted and where they are located for reference       Expected Outcomes Short Term: Able to state/look up THRR;Short Term: Able to use  daily as guideline for intensity in rehab;Long Term: Able to use THRR to govern intensity when exercising independently       Able to check pulse independently Yes       Intervention Review the importance of being able to check your own pulse for safety during independent exercise;Provide education and demonstration on how to check pulse in carotid and radial arteries.       Expected Outcomes Short Term: Able to explain why pulse checking is important during independent exercise;Long Term: Able to check pulse independently and accurately       Understanding of Exercise Prescription Yes       Intervention Provide education, explanation, and written materials on patient's individual exercise prescription       Expected Outcomes Short Term: Able to explain program exercise prescription;Long Term: Able to explain home exercise prescription to exercise independently

## 2024-10-21 NOTE — Telephone Encounter (Signed)
 Copied from CRM #[id number]. Topic: General - Other >> Oct 21, 2024 10:16 AM Victoria A wrote: Reason for CRM: Patient called back agent read message to patient-patient said she thinks she picked up Jardiance  last night from pharmacy but is not sure because it was late and she was tired. Patient said she would call back to let office know which medication it was that was picked up

## 2024-10-22 ENCOUNTER — Telehealth: Payer: Self-pay

## 2024-10-22 NOTE — Telephone Encounter (Signed)
 Called over to [facility] and confirmed that the medication was picked up by the patient on 10/19/24. Will refuse medication refill.

## 2024-10-22 NOTE — Progress Notes (Signed)
 Complex Care Management Note  Care Guide Note 10/22/2024 Name: Emily Hinton MRN: [id number] DOB: 1961-09-12  Emily Hinton is a 64 y.o. year old female who sees Emily Pao, NP for primary care. I reached out to Emily Hinton by phone today to offer complex care management services.  Ms. [facility]- Hinton was given information about Complex Care Management services today including:   The Complex Care Management services include support from the care team which includes your Nurse Care Manager, Clinical Social Worker, or Pharmacist.  The Complex Care Management team is here to help remove barriers to the health concerns and goals most important to you. Complex Care Management services are voluntary, and the patient may decline or stop services at any time by request to their care team member.   Complex Care Management Consent Status: Patient agreed to services and verbal consent obtained.   Follow up plan:  Telephone appointment with complex care management team member scheduled for:  10/26/2024  Encounter Outcome:  Patient Scheduled  Emily Hinton , RMA     [place]  [facility] Guide  Direct Dial : [phone]  Website: [email or web address]

## 2024-10-23 ENCOUNTER — Ambulatory Visit: Admitting: Nurse Practitioner

## 2024-10-26 ENCOUNTER — Other Ambulatory Visit: Payer: Self-pay

## 2024-10-26 NOTE — Patient Instructions (Signed)
 Visit Information  Thank you for taking time to visit with me today. Please don't hesitate to contact me if I can be of assistance to you before our next scheduled appointment.  Our next appointment is by telephone on 11/09/2024 at 2:00 PM Please call the care guide team at [phone] if you need to cancel or reschedule your appointment.   Following is a copy of your care plan:   Goals Addressed             This Visit's Progress    VBCI Social Work Care Plan: LCSW       Problems:   Housing barriers  CSW Clinical Goal(s):   Over the next 90 days the Parent will work with Child Psychotherapist to address concerns related to housing barriers and linkage to a dentist and an ophthalmologist .  Over the next two weeks, patient will follow up on community resources that have been provided by [facility].  Interventions:  Social Determinants of Health in Patient with DMII : SDOH assessments completed: Housing - Patient reports wanting a place of her own. Evaluation of current treatment plan related to unmet needs Housing resources: LCSW made appointment with HEDWIG Rouse for 10/30/2024 at 3:00 PM. Mental Health LCSW completed PHQ2/9 and discussed results. LCSW and patient discussed when pt took assessment last week she was answering based off of her medical conditions and the results are different. Motivational interviewing Active listening LCSW offered a listening ear LCSW completed th GAD 7 and discussed results. Patient is compliant with medication. Patient reports having therapy in the past and did not think it was effective therefore is declining therapy at this time.  Patient Goals/Self-Care Activities:  Continue taking your medication as prescribed.   Coordinate with LCSW  to assist with housing barriers.  Plan:   Telephone follow up appointment with care management team member scheduled for:  BSW 10/30/2024 at 3:00 PM and LCSW at 11/09/2024 at 2:00 PM        Please call 911 if you  are experiencing a Mental Health or Behavioral Health Crisis or need someone to talk to.  Patient verbalized understanding of Care plan and visit instructions communicated this visit  Olam Ally, MSW, LCSW [place]  [facility], [facility] Licensed Clinical Social Worker Direct Dial : [phone]

## 2024-10-26 NOTE — Patient Outreach (Signed)
 Complex Care Management   Visit Note  10/26/2024  Name:  Emily Hinton MRN: [id number] DOB: 19-Sep-1961  Situation: Referral received for Complex Care Management related to SDOH Barriers:  Housing patient is wanting a place of her own. I obtained verbal consent from Patient.  Visit completed with Patient  on the phone. LCSW met with patient for initial visit and patient is requesting with linkage to a dentist and ophthalmologist. Patient is requesting assistance with finding her own housing. Background:   Past Medical History:  Diagnosis Date   CHF (congestive heart failure) (HCC)    CSF leak    Diabetes mellitus without complication (HCC)    DM2 (diabetes mellitus, type 2) (HCC)    Essential hypertension    Heart attack (HCC)    Hyperlipidemia    Morbid obesity (HCC)    NICM (nonischemic cardiomyopathy) (HCC)    Nonobstructive atherosclerosis of coronary artery    OSA (obstructive sleep apnea)     Assessment: Patient Reported Symptoms:  Cognitive Cognitive Status: Normal speech and language skills, Alert and oriented to person, place, and time Cognitive/Intellectual Conditions Management [RPT]: None reported or documented in medical history or problem list      Neurological Neurological Review of Symptoms: Dizziness (pt reports dizziness when bending over) Neurological Management Strategies: Adequate rest Neurological Comment: 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  HEENT HEENT Symptoms Reported: Other: (Patient reports difficulity driving at night) HEENT Management Strategies: Adequate rest    Cardiovascular Cardiovascular Symptoms Reported: No symptoms reported (Pt reports getting ready to start cardiovascular therapy) Does patient have uncontrolled Hypertension?: No Cardiovascular Management Strategies: Adequate rest  Respiratory Respiratory Symptoms Reported: Shortness of breath Other Respiratory Symptoms: Patient report SOB when going up  steps Respiratory Management Strategies: Adequate rest  Endocrine Endocrine Symptoms Reported: No symptoms reported Is patient diabetic?: Yes Is patient checking blood sugars at home?: Yes List most recent blood sugar readings, include date and time of day: Patient reports reading morning was 130 Endocrine Self-Management Outcome: 4 (good)  Gastrointestinal Gastrointestinal Symptoms Reported: No symptoms reported      Genitourinary Genitourinary Symptoms Reported: Urgency Additional Genitourinary Details: Patient reports taking water pills Genitourinary Management Strategies: Medication therapy, Adequate rest  Integumentary Integumentary Symptoms Reported: Itching Additional Integumentary Details: Patient reports itching in the vaginal area Skin Management Strategies: Medication therapy  Musculoskeletal Musculoskelatal Symptoms Reviewed: Back pain Additional Musculoskeletal Details: chronic back pain, pt reports due to spinal surgery Musculoskeletal Management Strategies: Adequate rest Musculoskeletal Comment: patient report a recent fall Falls in the past year?: Yes Number of falls in past year: 1 or less Was there an injury with Fall?: No Fall Risk Category Calculator: 1 Patient Fall Risk Level: Low Fall Risk Patient at Risk for Falls Due to: Impaired balance/gait Fall risk Follow up: Falls evaluation completed  Psychosocial Psychosocial Symptoms Reported: Anxiety - if selected complete GAD, Depression - if selected complete PHQ 2-9 Additional Psychological Details: Patient reports escalating anxiety when someone else is driving Behavioral Management Strategies: Medication therapy (Patient report not seeking therapy at this time) Behavioral Health Comment: Patient reports trying therapy and did not work Major Change/Loss/Stressor/Fears (CP): Environment Behaviors When Feeling Stressed/Fearful: Heart racing and start sweating per patient Techniques to [employer] with Loss/Stress/Change:  Medication Quality of Family Relationships: stressful, helpful, involved Do you feel physically threatened by others?: No    10/26/2024    PHQ2-9 Depression Screening   Little interest or pleasure in doing things Several days  Feeling down, depressed,  or hopeless Several days  PHQ-2 - Total Score 2  Trouble falling or staying asleep, or sleeping too much Several days (Patient reports that she has troubling falling asleep due to past work schedule)  Feeling tired or having little energy Several days (Patient reports that she has troubling falling asleep due to past work schedule)  Poor appetite or overeating  Several days (Patient reports poor appetite)  Feeling bad about yourself - or that you are a failure or have let yourself or your family down Not at all  Trouble concentrating on things, such as reading the newspaper or watching television Not at all  Moving or speaking so slowly that other people could have noticed.  Or the opposite - being so fidgety or restless that you have been moving around a lot more than usual Several days (Patient reports that her speech i slow sometimes due to past surgery)  Thoughts that you would be better off dead, or hurting yourself in some way Not at all (patient reports no thoughts, plan or intent)  PHQ2-9 Total Score 6  If you checked off any problems, how difficult have these problems made it for you to do your work, take care of things at home, or get along with other people Not difficult at all  Depression Interventions/Treatment  (Patient reports not seeking treatment for depression)    There were no vitals filed for this visit. Pain Scale: 0-10 Pain Score: 8  Pain Type: Chronic pain Pain Location: Back Pain Orientation: Lower Pain Descriptors / Indicators: Constant Pain Intervention(s): Medication (See eMAR)  Medications Reviewed Today     Reviewed by Sherren Olam BRAVO, LCSW (Social Worker) on 10/26/24 at 1451  Med List Status: <None>    Medication Order Taking? Sig Documenting Provider Last Dose Status Informant  acetaminophen  (TYLENOL ) 500 MG tablet [id number] Yes Take 500 mg by mouth every 6 (six) hours as needed. [provider]  Active   aspirin  81 MG chewable tablet [id number] Yes Chew 1 tablet (81 mg total) by mouth daily. Abigail Bernardino HERO, PA-C  Active   carvedilol  (COREG ) 25 MG tablet [id number] Yes Take 1 tablet (25 mg total) by mouth 2 (two) times daily. Abigail Bernardino HERO, PA-C  Active   citalopram  (CELEXA ) 20 MG tablet [id number] Yes Take 1 tablet (20 mg total) by mouth daily. Melvin Pao, NP  Active   conjugated estrogens  (PREMARIN ) vaginal cream [id number] Yes Place 0.25 Applicatorfuls vaginally daily. Use nightly for 2 weeks, then twice a week as needed. Justino Eleanor HERO, CNM  Active   empagliflozin  (JARDIANCE ) 25 MG TABS tablet [id number] Yes Take 1 tablet (25 mg total) by mouth daily before breakfast. To replace 10mg - place on hold until patient requests please Melvin Pao, NP  Active   gabapentin  (NEURONTIN ) 300 MG capsule [id number] Yes TAKE 1 CAPSULE(300 MG) BY MOUTH THREE TIMES DAILY Melvin Pao, NP  Active   insulin  glargine (LANTUS  SOLOSTAR) 100 UNIT/ML Solostar Pen [id number] Yes Inject 60 Units into the skin at bedtime. Melvin Pao, NP  Active   insulin  lispro (HUMALOG  KWIKPEN) 100 UNIT/ML KwikPen [id number] Yes Inject 5 Units into the skin 3 (three) times daily. With meals.  Please dispense pens, not vials. Melvin Pao, NP  Active   Insulin  Pen Needle 31G X 8 MM MISC [id number] Yes Use to inject insulin  4 times daily- please dispense cheapest alternative on patient's insurance Melvin Pao, NP  Active   metFORMIN  (GLUCOPHAGE ) 1000 MG tablet [id number] Yes Take 1 tablet (1,000 mg  total) by mouth 2 (two) times daily with a meal. Melvin Pao, NP  Active   metroNIDAZOLE  (FLAGYL ) 500 MG tablet [id number] Yes Take 1 tablet (500 mg total) by mouth 2 (two) times daily. Justino Eleanor HERO, CNM  Active   omeprazole  (PRILOSEC) 20 MG capsule [id number] Yes TAKE 1 CAPSULE(20 MG) BY MOUTH DAILY Melvin Pao, NP  Active   rosuvastatin  (CRESTOR ) 10 MG tablet [id number] Yes TAKE 1 TABLET(10 MG) BY MOUTH DAILY Melvin Pao, NP  Active   sacubitril -valsartan  (ENTRESTO ) 97-103 MG [id number] Yes Take 1 tablet by mouth 2 (two) times daily. [employer] secondary to insurance, please: ID [id number] BIN [id number] PCN [id number] Group [id number] Melvin Pao, NP  Active   spironolactone  (ALDACTONE ) 25 MG tablet [id number]  TAKE 1 TABLET(25 MG) BY MOUTH DAILY  Patient not taking: Reported on 10/16/2024   Gollan, Timothy J, MD  Active   tirzepatide  (MOUNJARO ) 7.5 MG/0.5ML Pen [id number] Yes Inject 7.5 mg into the skin once a week. [place] A, FNP  Active   torsemide  (DEMADEX ) 20 MG tablet [id number] Yes Take 2 tablets (40 mg total) by mouth daily for 3 days, THEN 1 tablet (20 mg total) daily.  Patient taking differently: Take 1 tablet (20 mg total) daily.   Abigail Bernardino HERO, PA-C  Active   triamcinolone  cream (KENALOG ) 0.1 % [id number] Yes Apply 1 Application topically 2 (two) times daily. Melvin Pao, NP  Active   Vericiguat  2.5 MG TABS [id number] Yes Take 1 tablet (2.5 mg total) by mouth daily. Rolan Ezra RAMAN, MD  Active   Vericiguat  5 MG TABS [id number] Yes Take 1 tablet (5 mg total) by mouth daily as needed. [place] LABOR, FNP  Active             Recommendation:   Continue Current Plan of Care Follow up with community resources when provided  Follow Up Plan:   Telephone follow-up BSW on 10/30/2024 at 3:00PM and LCSW at 11/09/2024 at 2:00 PM  Olam Ally, MSW, LCSW [place]  [facility], [facility] Licensed Clinical Social Worker Direct Dial : [phone]

## 2024-10-27 NOTE — Progress Notes (Signed)
 "  Cardiology Office Note    Date:  10/30/2024   ID:  Emily Hinton, DOB 10-Dec-1960, MRN [id number]  PCP:  Melvin Pao, NP  Cardiologist:  Evalene Lunger, MD  Electrophysiologist:  Fonda Kitty, MD  Advanced Heart Failure: Rolan, MD  Chief Complaint: Follow-up  History of Present Illness:   Emily Mehlman- Hinton is a 64 y.o. 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   [name] was previously followed by cardiology in [place]. She was found to have a cardiomyopathy in 2018 with an EF of 45%. LHC at that time showed nonobstructive coronary arteries.  Echo in 05/2018 demonstrated a persistent cardiomyopathy with an EF of 45%, global hypokinesis, and diastolic dysfunction. This finding was unchanged when compared to echo in 2018.  Subsequent echo in 04/2021 showed an improved LV systolic function with an EF of 55%, probable normal LV wall motion, and diastolic dysfunction. She moved to [facility] from [place] several years ago.    She was admitted to the hospital in 01/2022 with worsening dyspnea and productive cough and found to have multilobar pneumonia with mild volume overload.  She was hypertensive and tachycardic upon presentation with an oxygen saturation of 72% on room air.  Chest x-ray showed volume overload versus pneumonia with small pleural effusions.  CT of the chest showed multilobar pneumonia with minimal pleural fluid on the left.  Echo during this admission showed an EF of 40-45%, mildly to moderately dilated LV internal cavity size, global hypokinesis, Gr2DD, normal RV  systolic function and ventricular cavity size, and mild mitral regurgitation.     Coronary CTA on 03/01/2022 showed a calcium  score of 0, with no evidence of CAD.  Noncardiac over read was notable for near complete resolution of groundglass opacities when compared to imaging from 01/2022. Limited echo 06/21/2022 showed a persistent cardiomyopathy with an EF of 35 to 40%, global hypokinesis, mildly dilated internal LV cavity size, grade 1 diastolic dysfunction, normal RV systolic function and ventricular cavity size, mild mitral regurgitation, and an estimated right atrial pressure of 3 mmHg.  Cardiac MRI on 07/18/2022 demonstrated an LVEF of 32% with no evidence of MI, scar, or LGE with normal RV systolic function and ventricular cavity size and no significant valvular abnormalities.  Findings were suggestive of nonischemic cardiomyopathy.   Given persistent cardiomyopathy despite optimization of GDMT, she was referred to the advanced heart failure service and evaluated by them on 11/30/2022, at which time she was noted to have a mildly elevated ReDs vest of 36%.  Her dyspnea was felt to be somewhat out of proportion to her exam.  CPX was a submaximal test with the resting spirometry suggestive of moderate restrictive lung function in the setting of severe obesity.  Exercise testing revealed a moderate functional limitation due to primarily obesity and deconditioning.  There was mild chronotropic incompetence in the setting of submaximal aerobic effort with no other evidence of significant heart failure limitation.  She has been referred to our pharmacy team for bariatric management.   Follow-up chest CT for pulmonary nodule 04/26/2023 showed interval resolution of airspace opacities with no suspicious pulmonary  nodule.  Partially visualized indeterminate lesion of the right hepatic lobe measuring 1.5 cm.  She was advised to follow-up with PCP for liver lesion.   She was admitted to the hospital in [place]  in  07/2023 with episodes of hemoptysis and blood-streaked emesis and signs of infectious pneumonitis with inflammatory changes felt to be less likely.  No evidence of PE on CTA chest.  She had traveled from [place] to [place] , approximately 9-hour trip.  She was treated with ceftriaxone  and azithromycin  with transition to cefpodoxime and azithromycin  as well as tranexamic as needed nebs with considerable improvement in hemoptysis.  Bronchoscopy showed no signs of pulmonary hemorrhage bilaterally to the subsegmental level.  Serial BAL's were performed in the right middle lobe with no grossly emergent return.  Bronchial culture, AFB and fungal culture with no growth.     She was seen in the office in 09/2023 noting chronic stable fatigue and dyspnea.  Echo in 10/2023 showed an EF of 30 to 35%, no regional wall motion abnormalities, moderately dilated LV internal cavity size, normal RV systolic function and ventricular cavity size, moderately dilated left atrium, moderate to severe mitral regurgitation, and an estimated right atrial pressure of 3 mmHg.  Following these results, advanced heart failure recommended follow-up for consideration of ICD, she canceled the appointment.  She was seen in the [facility] ED in 10/2023 for shortness of breath with CTA chest showing no evidence of PE with mild pulmonary edema and reactive hilar lymph nodes noted.  BNP at that time of 578.  She was treated with IV Lasix .  She was last seen by general cardiology in 06/2024 and was doing well from a cardiac perspective, under increased stress at home surrounding the health of her father.  She followed up with the advanced heart failure clinic later in 06/2024 with overall stable symptoms and was started on vericiguat  2.5 mg.  Echo in 08/2024 showed improvement in LV systolic function with an EF of 45 to 50%, mildly dilated LV internal cavity size, grade 1 diastolic dysfunction, normal RV systolic function and ventricular cavity size, mild mitral  regurgitation and a normal CVP.  She was subsequently evaluated by EP in 09/2024, and based on most recent EF showing an improvement in LV systolic function with an EF of 45 to 50%, she did not meet criteria for ICD or cardiac contractibility monitor.  She followed up with the [facility] CHF clinic in 09/2024 with titration of vericiguat  to 5 mg and was referred to cardiac rehab.  She comes in doing well from a cardiac perspective and is without symptoms of angina or cardiac decompensation.  No progressive dyspnea, orthopnea, lower extremity swelling, or abdominal ascension.  Continues to be frustrated with ongoing weight gain.  Will be starting pulmonary rehab today.  Adherent to cardiac medications without of target effect.  No dizziness, presyncope, or syncope.  No falls or symptoms concerning for bleeding.  She is considering discussing referral to bariatric medicine with PCP.   Labs independently reviewed: 06/2024 - A1c 15.4, Hgb 14.5, PLT 331, BUN 10, serum creatinine 1.02, potassium 5.2,.  4.6, AST/ALT normal, TC 127, TG 97, HDL 55, LDL 54, BNP 17, TSH normal  Past Medical History:  Diagnosis Date   CHF (congestive heart failure) (HCC)    CSF leak    Diabetes mellitus without complication (HCC)    DM2 (diabetes mellitus, type 2) (HCC)    Essential hypertension    Heart attack (HCC)    Hyperlipidemia  Morbid obesity (HCC)    NICM (nonischemic cardiomyopathy) (HCC)    Nonobstructive atherosclerosis of coronary artery    OSA (obstructive sleep apnea)     Past Surgical History:  Procedure Laterality Date   CERVICAL FUSION  2017   CSF leak repair x 2     2014 and 2017   CSF SHUNT  2017    Current Medications: Active Medications[1]  Allergies:   Anacardium occidentale and Cashew nut oil   Social History   Socioeconomic History   Marital status: Divorced    Spouse name: Not on file   Number of children: 2   Years of education: Not on file   Highest education level: Not on  file  Occupational History   Occupation: disability  Tobacco Use   Smoking status: Never   Smokeless tobacco: Never  Vaping Use   Vaping status: Never Used  Substance and Sexual Activity   Alcohol use: Never   Drug use: Never   Sexual activity: Yes  Other Topics Concern   Not on file  Social History Narrative   Not on file   Social Drivers of Health   Tobacco Use: Low Risk (10/30/2024)   Patient History    Smoking Tobacco Use: Never    Smokeless Tobacco Use: Never    Passive Exposure: Not on file  Financial Resource Strain: Low Risk  (06/18/2024)   Received from [facility]   Overall Financial Resource Strain (CARDIA)    Difficulty of Paying Living Expenses: Not hard at all  Food Insecurity: No Food Insecurity (10/26/2024)   Epic    Worried About Radiation Protection Practitioner of Food in the Last Year: Never true    Ran Out of Food in the Last Year: Never true  Transportation Needs: No Transportation Needs (10/26/2024)   Epic    Lack of Transportation (Medical): No    Lack of Transportation (Non-Medical): No  Physical Activity: Inactive (02/06/2024)   Exercise Vital Sign    Days of Exercise per Week: 0 days    Minutes of Exercise per Session: 0 min  Stress: No Stress Concern Present (02/06/2024)   [employer] of Occupational Health - Occupational Stress Questionnaire    Feeling of Stress : Not at all  Social Connections: Moderately Isolated (02/06/2024)   Social Connection and Isolation Panel    Frequency of Communication with Friends and Family: More than three times a week    Frequency of Social Gatherings with Friends and Family: More than three times a week    Attends Religious Services: 1 to 4 times per year    Active Member of Clubs or Organizations: No    Attends Banker Meetings: Never    Marital Status: Divorced  Depression (PHQ2-9): Medium Risk (10/26/2024)   Depression (PHQ2-9)    PHQ-2 Score: 6  Alcohol Screen: Low Risk (02/06/2024)   Alcohol  Screen    Last Alcohol Screening Score (AUDIT): 0  Housing: Low Risk (10/26/2024)   Epic    Unable to Pay for Housing in the Last Year: No    Number of Times Moved in the Last Year: 0    Homeless in the Last Year: No  Utilities: At Risk (10/26/2024)   Epic    Threatened with loss of utilities: Yes  Health Literacy: Adequate Health Literacy (02/06/2024)   B1300 Health Literacy    Frequency of need for help with medical instructions: Never     Family History:  The patient's family history includes  Heart failure in her father; Hypertension in her father; Kidney disease in her father. There is no history of Breast cancer.  ROS:   12-point review of systems is negative unless otherwise noted in the HPI.   EKGs/Labs/Other Studies Reviewed:    Studies reviewed were summarized above. The additional studies were reviewed today:  2D echo 01/30/2022: 1. Left ventricular ejection fraction, by estimation, is 40 to 45%. The  left ventricle has mildly decreased function. The left ventricle  demonstrates global hypokinesis. The left ventricular internal cavity size  was mildly to moderately dilated. Left  ventricular diastolic parameters are consistent with Grade II diastolic  dysfunction (pseudonormalization). Elevated left atrial pressure.   2. Right ventricular systolic function is normal. The right ventricular  size is normal. Tricuspid regurgitation signal is inadequate for assessing  PA pressure.   3. The mitral valve is normal in structure. Mild mitral valve  regurgitation. No evidence of mitral stenosis.   4. The aortic valve was not well visualized. Aortic valve regurgitation  is not visualized. No aortic stenosis is present. __________   Coronary CTA 03/01/2022: Noncardiac over read -  1. Near complete resolution of ground-glass opacities in the chest when compared to the recent study from April of 2023. 2. Bandlike changes along the posterior pleural surface likely a mixture of  atelectasis and post inflammatory changes also showing improvement since previous imaging. This may reflect developing post inflammatory fibrosis with and is mixed atelectatic changes at the lung bases, suggest attention on follow-up.   Cardiac overread -  Aorta:  Normal size.  No calcifications.  No dissection.   Aortic Valve:  Trileaflet.  No calcifications.   Coronary Arteries:  Normal coronary origin.  Right dominance.   RCA is a dominant artery that gives rise to PDA and PLA. There is no plaque.   Left main gives rise to LAD and LCX arteries. There is no LM disease.   LAD has no plaque.   LCX is a non-dominant artery that gives rise to a large obtuse marginal branch. There is no plaque.   Other findings:   Normal pulmonary vein drainage into the left atrium.   Normal left atrial appendage without a thrombus.   Normal size of the pulmonary artery.   IMPRESSION: 1. Normal coronary calcium  score of 0. Patient is low risk for coronary events. 2. Normal coronary origin with right dominance. 3. No evidence of CAD. 4. CAD-RADS 0. Consider non-atherosclerotic causes of chest pain. __________   Limited echo 06/21/2022: 1. Left ventricular ejection fraction, by estimation, is 35 to 40%. The  left ventricle has moderately decreased function. The left ventricle  demonstrates global hypokinesis. The left ventricular internal cavity size  was mildly dilated. Left ventricular  diastolic parameters are consistent with Grade I diastolic dysfunction  (impaired relaxation).   2. Right ventricular systolic function is normal. The right ventricular  size is normal. Tricuspid regurgitation signal is inadequate for assessing  PA pressure.   3. The mitral valve is normal in structure. Mild mitral valve  regurgitation. No evidence of mitral stenosis.   4. The aortic valve is normal in structure. Aortic valve regurgitation is  not visualized. No aortic stenosis is present.   5. The  inferior vena cava is normal in size with greater than 50%  respiratory variability, suggesting right atrial pressure of 3 mmHg. __________   Cardiac MRI 07/18/2022: FINDINGS: 1. Mildly dilated left ventricular size. Moderately reduced LV systolic function (LVEF = 32%). There is global  hypokinesis with no regional wall motion abnormalities.   There is no late gadolinium enhancement in the left ventricular myocardium.   LVEDV: 276 ml   LVESV: 187 ml   SV: 89 ml   CO: 4.9 L/min   Myocardial mass: 152   2. Normal right ventricular size, thickness and low normal systolic function (RVEF = 47%). There are no regional wall motion abnormalities.   3.  Normal left and right atrial size.   4. Normal size of the aortic root, ascending aorta and pulmonary artery. Normal pulmonic and aortic flow. Qp:Qs = 1.05   5.  Trace MR, Trace TR. no other significant valvular abnormalities.   6.  Normal pericardium.  No pericardial effusion.   IMPRESSION: 1.  Mildly dilated LV, moderately reduced LV function.  LVEF = 32% 2. No evidence for myocardial infiltration, late gadolinium enhancement or scar. 3.  No significant valvular abnormalities. 4.  Normal RV size and function 5.  Findings suggest non-ischemic cardiomyopathy. __________   Limited echo 11/02/2022: 1. Left ventricular ejection fraction, by estimation, is 40 to 45%. Left  ventricular ejection fraction by 3D volume is 43 %. The left ventricle has  mildly decreased function. The left ventricle demonstrates global  hypokinesis. The left ventricular  internal cavity size was mildly dilated. Left ventricular diastolic  parameters are indeterminate. The average left ventricular global  longitudinal strain is -8.5 %. The global longitudinal strain is abnormal.   2. Right ventricular systolic function is normal. The right ventricular  size is normal.   3. Left atrial size was mildly dilated.   4. The mitral valve is normal in  structure. Moderate mitral valve  regurgitation. No evidence of mitral stenosis.   5. The aortic valve is normal in structure. Aortic valve regurgitation is  not visualized. No aortic stenosis is present.   6. The inferior vena cava is normal in size with greater than 50%  respiratory variability, suggesting right atrial pressure of 3 mmHg.   Comparison(s): 06/21/2022-EF 35-40%.  __________   CPX 01/17/2023: Conclusion: The interpretation of this test is limited due to submaximal effort during the exercise. Based on available data, exercise testing with gas exchange demonstrates moderate functional impairment when compared to matched sedentary norms. Pre-exercise spirometry demonstrates moderate restrictive lung physiology, most likely related to body habitus. Patient appears limited due to deconditioning (due to previous medical conditions inhibiting physical activity) and body habitus with 13% improvement of PVO2 with corrections for ideal body weight. __________   2D echo 10/31/2023: 1. Left ventricular ejection fraction, by estimation, is 30 to 35%. The  left ventricle has moderately decreased function. The left ventricle has  no regional wall motion abnormalities. The left ventricular internal  cavity size was moderately dilated.  Left ventricular diastolic parameters are indeterminate. The average left  ventricular global longitudinal strain is -6.8 %. The global longitudinal  strain is abnormal.   2. Right ventricular systolic function is normal. The right ventricular  size is normal.   3. Left atrial size was moderately dilated.   4. The mitral valve is normal in structure. Moderate to severe mitral  valve regurgitation. No evidence of mitral stenosis.   5. The aortic valve has an indeterminant number of cusps. Aortic valve  regurgitation is not visualized. No aortic stenosis is present.   6. The inferior vena cava is normal in size with greater than 50%  respiratory variability,  suggesting right atrial pressure of 3 mmHg.  __________  2D echo 08/28/2024: 1. Left  ventricular ejection fraction, by estimation, is 45 to 50%. Left  ventricular ejection fraction by 3D volume is 49 %. The left ventricle has  mildly decreased function. Left ventricular endocardial border not  optimally defined to evaluate regional   wall motion. The left ventricular internal cavity size was mildly  dilated. Left ventricular diastolic parameters are consistent with Grade I  diastolic dysfunction (impaired relaxation). The average left ventricular  global longitudinal strain is -14.0 %.   The global longitudinal strain is abnormal.   2. Right ventricular systolic function is normal. The right ventricular  size is normal.   3. The mitral valve is normal in structure. Mild mitral valve  regurgitation. No evidence of mitral stenosis.   4. The aortic valve has an indeterminant number of cusps. Aortic valve  regurgitation is not visualized. No aortic stenosis is present.   5. The inferior vena cava is normal in size with greater than 50%  respiratory variability, suggesting right atrial pressure of 3 mmHg.   Comparison(s): A prior study was performed on 10/31/2023. The left  ventricular function has improved.    EKG:  EKG is ordered today.  The EKG ordered today demonstrates NSR, 81 bpm, left axis deviation, LVH, poor R wave progression along the precordial leads, nonspecific lateral ST-T changes  Recent Labs: 07/03/2024: BNP 17.4 07/08/2024: ALT 18; BUN 10; Creatinine, Ser 1.02; Hemoglobin 14.5; Platelets 331; Potassium 5.2; Sodium 136  Recent Lipid Panel    Component Value Date/Time   CHOL 127 07/03/2024 1014   TRIG 97 07/03/2024 1014   HDL 55 07/03/2024 1014   CHOLHDL 2.3 07/03/2024 1014   LDLCALC 54 07/03/2024 1014    PHYSICAL EXAM:    VS:  BP 114/78 (BP Location: Left Arm, Patient Position: Sitting, Cuff Size: Large)   Pulse 81 Comment: 85 oximeter  Ht 5' 11 (1.803 m)   Wt  287 lb (130.2 kg)   SpO2 100%   BMI 40.03 kg/m   BMI: Body mass index is 40.03 kg/m.  Physical Exam Vitals reviewed.  Constitutional:      Appearance: She is well-developed.  HENT:     Head: Normocephalic and atraumatic.  Eyes:     General:        Right eye: No discharge.        Left eye: No discharge.  Cardiovascular:     Rate and Rhythm: Normal rate and regular rhythm.     Heart sounds: Normal heart sounds, S1 normal and S2 normal. Heart sounds not distant. No midsystolic click and no opening snap. No murmur heard.    No friction rub.  Pulmonary:     Effort: Pulmonary effort is normal. No respiratory distress.     Breath sounds: Normal breath sounds. No decreased breath sounds, wheezing, rhonchi or rales.  Musculoskeletal:     Cervical back: Normal range of motion.     Right lower leg: No edema.     Left lower leg: No edema.  Skin:    General: Skin is warm and dry.     Nails: There is no clubbing.  Neurological:     Mental Status: She is alert and oriented to person, place, and time.  Psychiatric:        Speech: Speech normal.        Behavior: Behavior normal.        Thought Content: Thought content normal.        Judgment: Judgment normal.     Wt Readings from Last 3  Encounters:  10/30/24 287 lb (130.2 kg)  10/21/24 286 lb 12.8 oz (130.1 kg)  09/18/24 288 lb 3.2 oz (130.7 kg)     ASSESSMENT & PLAN:   HFmrEF secondary to NICM: Euvolemic and well compensated with NYHA class III symptoms.  Most recent echo showed improvement in LV systolic function with an EF of 45 to 50%.  Prior cardiac MRI showed no evidence of LGE with findings of nonischemic cardiomyopathy.  CPX in 2024 suggested that her predominant limitation was obesity and deconditioning.  Not on BiDil given relative tension and chronic headaches.  No longer a candidate for ICD/CCM given improvement in LV systolic function.  Continue carvedilol  25 mg twice daily, Entresto  97/23 mg twice daily, spironolactone  25  mg, Jardiance  10 mg, torsemide  20 mg daily, and vericiguat  5 mg.  Follow-up with advanced heart failure.  Nonobstructive CAD: Coronary CTA showed a calcium  score of 0 with no evidence of CAD.  LDL 54 in 06/2024.  Remains on rosuvastatin  10 mg.  Mitral regurgitation: Mild by echo in 08/2024.  Monitor with periodic echo.  HTN: Blood pressure is well controlled.  Pharmacotherapy as outlined above.  Obesity: Heart healthy diet and regular exercise recommended.  Remains on GLP-1 therapy.  Enrolling in pulmonary rehab.  OSA: CPAP.   Uncontrolled IDDM: A1c 15.4% in 06/2024.  Suspect this is contributing to her difficulty in losing weight on GLP-1 therapy.  Management per PCP.     Disposition: F/u with Dr. Gollan or an APP in 6 months, and EP as directed.    Medication Adjustments/Labs and Tests Ordered: Current medicines are reviewed at length with the patient today.  Concerns regarding medicines are outlined above. Medication changes, Labs and Tests ordered today are summarized above and listed in the Patient Instructions accessible in Encounters.   SignedBernardino Bring, PA-C 10/30/2024 9:40 AM     [facility] -  [address] [place], [place] [address] [phone]     [1]  Current Meds  Medication Sig   acetaminophen  (TYLENOL ) 500 MG tablet Take 500 mg by mouth every 6 (six) hours as needed.   aspirin  81 MG chewable tablet Chew 1 tablet (81 mg total) by mouth daily.   carvedilol  (COREG ) 25 MG tablet Take 1 tablet (25 mg total) by mouth 2 (two) times daily.   citalopram  (CELEXA ) 20 MG tablet Take 1 tablet (20 mg total) by mouth daily.   conjugated estrogens  (PREMARIN ) vaginal cream Place 0.25 Applicatorfuls vaginally daily. Use nightly for 2 weeks, then twice a week as needed.   empagliflozin  (JARDIANCE ) 25 MG TABS tablet Take 1 tablet (25 mg total) by mouth daily before breakfast. To replace 10mg - place on hold until patient requests please    gabapentin  (NEURONTIN ) 300 MG capsule TAKE 1 CAPSULE(300 MG) BY MOUTH THREE TIMES DAILY   insulin  glargine (LANTUS  SOLOSTAR) 100 UNIT/ML Solostar Pen Inject 60 Units into the skin at bedtime.   insulin  lispro (HUMALOG  KWIKPEN) 100 UNIT/ML KwikPen Inject 5 Units into the skin 3 (three) times daily. With meals.  Please dispense pens, not vials.   Insulin  Pen Needle 31G X 8 MM MISC Use to inject insulin  4 times daily- please dispense cheapest alternative on patient's insurance   metFORMIN  (GLUCOPHAGE ) 1000 MG tablet Take 1 tablet (1,000 mg total) by mouth 2 (two) times daily with a meal.   metroNIDAZOLE  (FLAGYL ) 500 MG tablet Take 1 tablet (500 mg total) by mouth 2 (two) times daily.   omeprazole  (PRILOSEC)  20 MG capsule TAKE 1 CAPSULE(20 MG) BY MOUTH DAILY   rosuvastatin  (CRESTOR ) 10 MG tablet TAKE 1 TABLET(10 MG) BY MOUTH DAILY   sacubitril -valsartan  (ENTRESTO ) 97-103 MG Take 1 tablet by mouth 2 (two) times daily. [employer] grant secondary to insurance, please: ID [id number] BIN [id number] PCN [id number] Group [id number]   spironolactone  (ALDACTONE ) 25 MG tablet TAKE 1 TABLET(25 MG) BY MOUTH DAILY   tirzepatide  (MOUNJARO ) 7.5 MG/0.5ML Pen Inject 7.5 mg into the skin once a week.   torsemide  (DEMADEX ) 20 MG tablet Take 2 tablets (40 mg total) by mouth daily for 3 days, THEN 1 tablet (20 mg total) daily. (Patient taking differently: Take 1 tablet (20 mg total) daily.)   triamcinolone  cream (KENALOG ) 0.1 % Apply 1 Application topically 2 (two) times daily.   Vericiguat  2.5 MG TABS Take 1 tablet (2.5 mg total) by mouth daily.   Vericiguat  5 MG TABS Take 1 tablet (5 mg total) by mouth daily as needed.   "

## 2024-10-28 ENCOUNTER — Encounter: Payer: Self-pay | Admitting: *Deleted

## 2024-10-28 DIAGNOSIS — I5022 Chronic systolic (congestive) heart failure: Secondary | ICD-10-CM

## 2024-10-28 NOTE — Progress Notes (Signed)
 Pulmonary Individual Treatment Plan  Patient Details  Name: Emily Hinton MRN: [id number] Date of Birth: July 08, 1961 Referring Provider:   Flowsheet Row Pulmonary Rehab from 10/21/2024 in [facility] Cardiac and Pulmonary Rehab  Referring Provider Rolan Barrack, MD    Initial Encounter Date:  Flowsheet Row Pulmonary Rehab from 10/21/2024 in [facility] Cardiac and Pulmonary Rehab  Date 10/21/24    Visit Diagnosis: Heart failure, chronic systolic (HCC)  Patient's Home Medications on Admission: Current Medications[1]  Past Medical History: Past Medical History:  Diagnosis Date   CHF (congestive heart failure) (HCC)    CSF leak    Diabetes mellitus without complication (HCC)    DM2 (diabetes mellitus, type 2) (HCC)    Essential hypertension    Heart attack (HCC)    Hyperlipidemia    Morbid obesity (HCC)    NICM (nonischemic cardiomyopathy) (HCC)    Nonobstructive atherosclerosis of coronary artery    OSA (obstructive sleep apnea)     Tobacco Use: Tobacco Use History[2]  Labs: Review Flowsheet  More data exists      Latest Ref Rng & Units 03/08/2023 08/16/2023 11/18/2023 07/03/2024 07/08/2024  Labs for ITP Cardiac and Pulmonary Rehab  Cholestrol 100 - 199 mg/dL 899  891  - 872  -  LDL (calc) 0 - 99 mg/dL 35  42  - 54  -  HDL-C >39 mg/dL 47  48  - 55  -  Trlycerides 0 - 149 mg/dL 95  97  - 97  -  Hemoglobin A1c 4.8 - 5.6 % 6.4  6.7  6.5  - 15.4      Pulmonary Assessment Scores:  Pulmonary Assessment Scores     Row Name 10/21/24 1538         mMRC Score   mMRC Score 3        UCSD: Self-administered rating of dyspnea associated with activities of daily living (ADLs) 6-point scale (0 = not at all to 5 = maximal or unable to do because of breathlessness)  Scoring Scores range from 0 to 120.  Minimally important difference is 5 units  CAT: CAT can identify the health impairment of COPD patients and is better correlated with disease progression.  CAT has a  scoring range of zero to 40. The CAT score is classified into four groups of low (less than 10), medium (10 - 20), high (21-30) and very high (31-40) based on the impact level of disease on health status. A CAT score over 10 suggests significant symptoms.  A worsening CAT score could be explained by an exacerbation, poor medication adherence, poor inhaler technique, or progression of COPD or comorbid conditions.  CAT MCID is 2 points  mMRC: mMRC (Modified Medical Research Council) Dyspnea Scale is used to assess the degree of baseline functional disability in patients of respiratory disease due to dyspnea. No minimal important difference is established. A decrease in score of 1 point or greater is considered a positive change.   Pulmonary Function Assessment:   Exercise Target Goals: Exercise Program Goal: Individual exercise prescription set using results from initial 6 min walk test and THRR while considering  patients activity barriers and safety.   Exercise Prescription Goal: Initial exercise prescription builds to 30-45 minutes a day of aerobic activity, 2-3 days per week.  Home exercise guidelines will be given to patient during program as part of exercise prescription that the participant will acknowledge.  Education: Aerobic Exercise: - Group verbal and visual presentation on the components of exercise prescription. Introduces  F.I.T.T principle from ACSM for exercise prescriptions.  Reviews F.I.T.T. principles of aerobic exercise including progression. Written material provided at class time.   Education: Resistance Exercise: - Group verbal and visual presentation on the components of exercise prescription. Introduces F.I.T.T principle from ACSM for exercise prescriptions  Reviews F.I.T.T. principles of resistance exercise including progression. Written material provided at class time.    Education: Exercise & Equipment Safety: - Individual verbal instruction and demonstration of  equipment use and safety with use of the equipment. Flowsheet Row Pulmonary Rehab from 10/21/2024 in [facility] Cardiac and Pulmonary Rehab  Date 10/21/24  Educator MB  Instruction Review Code 1- Verbalizes Understanding    Education: Exercise Physiology & General Exercise Guidelines: - Group verbal and written instruction with models to review the exercise physiology of the cardiovascular system and associated critical values. Provides general exercise guidelines with specific guidelines to those with heart or lung disease.    Education: Flexibility, Balance, Mind/Body Relaxation: - Group verbal and visual presentation with interactive activity on the components of exercise prescription. Introduces F.I.T.T principle from ACSM for exercise prescriptions. Reviews F.I.T.T. principles of flexibility and balance exercise training including progression. Also discusses the mind body connection.  Reviews various relaxation techniques to help reduce and manage stress (i.e. Deep breathing, progressive muscle relaxation, and visualization). Balance handout provided to take home. Written material provided at class time.   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 HR 123 bpm     Max Ex. BP  162/90     2 Minute Post BP 140/88       Interval HR   1 Minute HR 105     2 Minute HR 116     3 Minute HR 121     4 Minute HR 121     5 Minute HR 123     6 Minute HR 123     2 Minute Post HR 87     Interval Heart Rate? Yes       Interval Oxygen   Interval Oxygen? Yes     Baseline Oxygen Saturation % 95 %     1 Minute Oxygen Saturation % 93 %     1 Minute Liters of Oxygen 0 L     2 Minute Oxygen Saturation % 95 %     2 Minute Liters of Oxygen 0 L  3 Minute Oxygen Saturation % 94 %     3 Minute Liters of Oxygen 0 L     4 Minute Oxygen Saturation % 95 %     4 Minute Liters of Oxygen 0 L     5 Minute Oxygen Saturation % 94 %     5 Minute Liters of Oxygen 0 L     6 Minute Oxygen Saturation % 94 %     6 Minute Liters of Oxygen 0 L     2 Minute Post Oxygen Saturation % 96 %     2 Minute Post Liters of Oxygen 0 L       Oxygen Initial Assessment:  Oxygen Initial Assessment - 10/16/24 1338       Home Oxygen   Home Oxygen Device None    Sleep Oxygen Prescription None    Home Exercise Oxygen Prescription None    Home Resting Oxygen Prescription None    Compliance with Home Oxygen Use Yes      Initial 6 min Walk   Oxygen Used None      Program Oxygen Prescription   Program Oxygen Prescription None      Intervention   Short Term Goals To learn and understand importance of monitoring SPO2 with pulse oximeter and demonstrate accurate use of the pulse oximeter.;To learn and demonstrate proper pursed lip breathing techniques or other breathing techniques. ;To learn and understand importance of maintaining oxygen saturations>88%;To learn and demonstrate proper use of respiratory medications    Long  Term Goals Maintenance of O2 saturations>88%;Compliance with respiratory medication;Verbalizes importance of monitoring SPO2 with pulse oximeter and return demonstration;Demonstrates proper use of MDIs;Exhibits proper breathing techniques, such as pursed lip breathing or other method  taught during program session          Oxygen Re-Evaluation:   Oxygen Discharge (Final Oxygen Re-Evaluation):   Initial Exercise Prescription:  Initial Exercise Prescription - 10/21/24 1500       Date of Initial Exercise RX and Referring Provider   Date 10/21/24    Referring Provider Rolan Barrack, MD      Oxygen   Maintain Oxygen Saturation 88% or higher      Recumbant Bike   Level 3    RPM 50    Watts 25    Minutes 15    METs 3.26      NuStep   Level 3    SPM 80    Minutes 15    METs 3.26      REL-XR   Level 2    Speed 50    Minutes 15    METs 3.26      T5 Nustep   Level 3   T6   SPM 80    Minutes 15    METs 3.26      Track   Laps 37    Minutes 15    METs 3.01      Prescription Details   Frequency (times per week) 2    Duration Progress to 30 minutes of continuous aerobic without signs/symptoms of physical distress      Intensity   THRR 40-80% of Max Heartrate 112-142    Ratings of Perceived Exertion 11-13    Perceived Dyspnea 0-4      Progression   Progression Continue to progress workloads to maintain intensity without signs/symptoms of physical distress.      Resistance Training   Training Prescription Yes    Weight 4 lb  Reps 10-15          Perform Capillary Blood Glucose checks as needed.  Exercise Prescription Changes:   Exercise Prescription Changes     Row Name 10/21/24 1500             Response to Exercise   Blood Pressure (Admit) 146/88       Blood Pressure (Exercise) 162/90       Blood Pressure (Exit) 140/88       Heart Rate (Admit) 82 bpm       Heart Rate (Exercise) 123 bpm       Heart Rate (Exit) 87 bpm       Oxygen Saturation (Admit) 95 %       Oxygen Saturation (Exercise) 93 %       Oxygen Saturation (Exit) 96 %       Rating of Perceived Exertion (Exercise) 9       Perceived Dyspnea (Exercise) 3       Symptoms none       Comments results         Progression   Average METs 3.26           Exercise Comments:   Exercise Goals and Review:   Exercise Goals     Row Name 10/21/24 1537             Exercise Goals   Increase Physical Activity Yes       Intervention Provide advice, education, support and counseling about physical activity/exercise needs.;Develop an individualized exercise prescription for aerobic and resistive training based on initial evaluation findings, risk stratification, comorbidities and participant's personal goals.       Expected Outcomes Short Term: Attend rehab on a regular basis to increase amount of physical activity.;Long Term: Exercising regularly at least 3-5 days a week.;Long Term: Add in home exercise to make exercise part of routine and to increase amount of physical activity.       Increase Strength and Stamina Yes       Intervention Provide advice, education, support and counseling about physical activity/exercise needs.;Develop an individualized exercise prescription for aerobic and resistive training based on initial evaluation findings, risk stratification, comorbidities and participant's personal goals.       Expected Outcomes Short Term: Increase workloads from initial exercise prescription for resistance, speed, and METs.;Short Term: Perform resistance training exercises routinely during rehab and add in resistance training at home;Long Term: Improve cardiorespiratory fitness, muscular endurance and strength as measured by increased METs and functional capacity ( )       Able to understand and use rate of perceived exertion (RPE) scale Yes       Intervention Provide education and explanation on how to use RPE scale       Expected Outcomes Short Term: Able to use RPE daily in rehab to express subjective intensity level;Long Term:  Able to use RPE to guide intensity level when exercising independently       Able to understand and use Dyspnea scale Yes       Intervention Provide education and explanation on how to use Dyspnea scale        Expected Outcomes Short Term: Able to use Dyspnea scale daily in rehab to express subjective sense of shortness of breath during exertion;Long Term: Able to use Dyspnea scale to guide intensity level when exercising independently       Knowledge and understanding of Target Heart Rate Range (THRR) Yes       Intervention  Provide education and explanation of THRR including how the numbers were predicted and where they are located for reference       Expected Outcomes Short Term: Able to state/look up THRR;Short Term: Able to use daily as guideline for intensity in rehab;Long Term: Able to use THRR to govern intensity when exercising independently       Able to check pulse independently Yes       Intervention Review the importance of being able to check your own pulse for safety during independent exercise;Provide education and demonstration on how to check pulse in carotid and radial arteries.       Expected Outcomes Short Term: Able to explain why pulse checking is important during independent exercise;Long Term: Able to check pulse independently and accurately       Understanding of Exercise Prescription Yes       Intervention Provide education, explanation, and written materials on patient's individual exercise prescription       Expected Outcomes Short Term: Able to explain program exercise prescription;Long Term: Able to explain home exercise prescription to exercise independently          Exercise Goals Re-Evaluation :   Discharge Exercise Prescription (Final Exercise Prescription Changes):  Exercise Prescription Changes - 10/21/24 1500       Response to Exercise   Blood Pressure (Admit) 146/88    Blood Pressure (Exercise) 162/90    Blood Pressure (Exit) 140/88    Heart Rate (Admit) 82 bpm    Heart Rate (Exercise) 123 bpm    Heart Rate (Exit) 87 bpm    Oxygen Saturation (Admit) 95 %    Oxygen Saturation (Exercise) 93 %    Oxygen Saturation (Exit) 96 %    Rating of Perceived  Exertion (Exercise) 9    Perceived Dyspnea (Exercise) 3    Symptoms none    Comments results      Progression   Average METs 3.26          Nutrition:  Target Goals: Understanding of nutrition guidelines, daily intake of sodium 1500mg , cholesterol 200mg , calories 30% from fat and 7% or less from saturated fats, daily to have 5 or more servings of fruits and vegetables.  Education: Nutrition 1 -Group instruction provided by verbal, written material, interactive activities, discussions, models, and posters to present general guidelines for heart healthy nutrition including macronutrients, label reading, and promoting whole foods over processed counterparts. Education serves as pensions consultant of discussion of heart healthy eating for all. Written material provided at class time.     Education: Nutrition 2 -Group instruction provided by verbal, written material, interactive activities, discussions, models, and posters to present general guidelines for heart healthy nutrition including sodium, cholesterol, and saturated fat. Providing guidance of habit forming to improve blood pressure, cholesterol, and body weight. Written material provided at class time.     Biometrics:  Pre Biometrics - 10/21/24 1537       Pre Biometrics   Height 5' 10.4 (1.788 m)    Weight 286 lb 12.8 oz (130.1 kg)    Waist Circumference 48.5 inches    Hip Circumference 52 inches    Waist to Hip Ratio 0.93 %    BMI (Calculated) 40.69    Single Leg Stand 30 seconds           Nutrition Therapy Plan and Nutrition Goals:  Nutrition Therapy & Goals - 10/21/24 1538       Personal Nutrition Goals   Nutrition Goal Will wait a few  weeks to schedule with RD      Intervention Plan   Intervention Prescribe, educate and counsel regarding individualized specific dietary modifications aiming towards targeted core components such as weight, hypertension, lipid management, diabetes, heart failure and other  comorbidities.;Nutrition handout(s) given to patient.    Expected Outcomes Short Term Goal: Understand basic principles of dietary content, such as calories, fat, sodium, cholesterol and nutrients.;Short Term Goal: A plan has been developed with personal nutrition goals set during dietitian appointment.;Long Term Goal: Adherence to prescribed nutrition plan.          Nutrition Assessments:  MEDIFICTS Score Key: >=70 Need to make dietary changes  40-70 Heart Healthy Diet <= 40 Therapeutic Level Cholesterol Diet   Picture Your Plate Scores: <59 Unhealthy dietary pattern with much room for improvement. 41-50 Dietary pattern unlikely to meet recommendations for good health and room for improvement. 51-60 More healthful dietary pattern, with some room for improvement.  >60 Healthy dietary pattern, although there may be some specific behaviors that could be improved.   Nutrition Goals Re-Evaluation:   Nutrition Goals Discharge (Final Nutrition Goals Re-Evaluation):   Psychosocial: Target Goals: Acknowledge presence or absence of significant depression and/or stress, maximize coping skills, provide positive support system. Participant is able to verbalize types and ability to use techniques and skills needed for reducing stress and depression.   Education: Stress, Anxiety, and Depression - Group verbal and visual presentation to define topics covered.  Reviews how body is impacted by stress, anxiety, and depression.  Also discusses healthy ways to reduce stress and to treat/manage anxiety and depression.  Written material provided at class time.   Education: Sleep Hygiene -Provides group verbal and written instruction about how sleep can affect your health.  Define sleep hygiene, discuss sleep cycles and impact of sleep habits. Review good sleep hygiene tips.    Initial Review & Psychosocial Screening:  Initial Psych Review & Screening - 10/16/24 1347       Initial Review    Current issues with Current Stress Concerns    Source of Stress Concerns Chronic Illness      Family Dynamics   Good Support System? Yes   sons, grandkids     Barriers   Psychosocial barriers to participate in program There are no identifiable barriers or psychosocial needs.;The patient should benefit from training in stress management and relaxation.      Screening Interventions   Interventions Encouraged to exercise;Provide feedback about the scores to participant;To provide support and resources with identified psychosocial needs    Expected Outcomes Short Term goal: Utilizing psychosocial counselor, staff and physician to assist with identification of specific Stressors or current issues interfering with healing process. Setting desired goal for each stressor or current issue identified.;Long Term Goal: Stressors or current issues are controlled or eliminated.;Short Term goal: Identification and review with participant of any Quality of Life or Depression concerns found by scoring the questionnaire.;Long Term goal: The participant improves quality of Life and PHQ9 Scores as seen by post scores and/or verbalization of changes          Quality of Life Scores:  Scores of 19 and below usually indicate a poorer quality of life in these areas.  A difference of  2-3 points is a clinically meaningful difference.  A difference of 2-3 points in the total score of the Quality of Life Index has been associated with significant improvement in overall quality of life, self-image, physical symptoms, and general health in studies assessing change in  quality of life.  PHQ-9: Review Flowsheet  More data exists      10/26/2024 10/21/2024 08/21/2024 07/21/2024 07/08/2024  Depression screen PHQ 2/9  Decreased Interest 1 2 2 2 1   Down, Depressed, Hopeless 1 2 0 0 0  PHQ - 2 Score 2 4 2 2 1   Altered sleeping 1 3 3 3 3   Tired, decreased energy 1 2 2 2 3   Change in appetite 1 3 2 3 3   Feeling bad or failure  about yourself  0 0 0 2 2  Trouble concentrating 0 0 3 2 2   Moving slowly or fidgety/restless 1 3 0 0 2  Suicidal thoughts 0 1 0 0 0  PHQ-9 Score 6 16 12 14  16    Difficult doing work/chores Not difficult at all Somewhat difficult Somewhat difficult Somewhat difficult Somewhat difficult    Details       Data saved with a previous flowsheet row definition        Interpretation of Total Score  Total Score Depression Severity:  1-4 = Minimal depression, 5-9 = Mild depression, 10-14 = Moderate depression, 15-19 = Moderately severe depression, 20-27 = Severe depression   Psychosocial Evaluation and Intervention:  Psychosocial Evaluation - 10/16/24 1411       Psychosocial Evaluation & Interventions   Comments Azalea is coming to pulmonary rehab with heart failure. She states her health is her biggest stress concern. She wants to lose weight so she can help her heart and shortness of breath. Her sons and some extended family live with her, so she has people around her to help when needed. She is looking forward to participating in something that is just for her. When she wants to destress, she watches movies. She does have a sister out of town who she tries to go on trips with when possible, so she is looking forward to increasing her stamina so she can keep doing these things.    Expected Outcomes Short: attend pulmonary rehab for education and exercise Long: Develop and maintain positive self care habits    Continue Psychosocial Services  Follow up required by staff          Psychosocial Re-Evaluation:   Psychosocial Discharge (Final Psychosocial Re-Evaluation):   Education: Education Goals: Education classes will be provided on a weekly basis, covering required topics. Participant will state understanding/return demonstration of topics presented.  Learning Barriers/Preferences:  Learning Barriers/Preferences - 10/16/24 1341       Learning Barriers/Preferences   Learning  Barriers None    Learning Preferences None          General Pulmonary Education Topics:  Infection Prevention: - Provides verbal and written material to individual with discussion of infection control including proper hand washing and proper equipment cleaning during exercise session. Flowsheet Row Pulmonary Rehab from 10/21/2024 in [facility] Cardiac and Pulmonary Rehab  Date 10/21/24  Educator MB  Instruction Review Code 1- Verbalizes Understanding    Falls Prevention: - Provides verbal and written material to individual with discussion of falls prevention and safety. Flowsheet Row Pulmonary Rehab from 10/21/2024 in [facility] Cardiac and Pulmonary Rehab  Date 10/21/24  Educator MB  Instruction Review Code 1- Verbalizes Understanding    Chronic Lung Disease Review: - Group verbal instruction with posters, models, PowerPoint presentations and videos,  to review new updates, new respiratory medications, new advancements in procedures and treatments. Providing information on websites and 800 numbers for continued self-education. Includes information about supplement oxygen, available portable oxygen systems, continuous  and intermittent flow rates, oxygen safety, concentrators, and Medicare reimbursement for oxygen. Explanation of Pulmonary Drugs, including class, frequency, complications, importance of spacers, rinsing mouth after steroid MDI's, and proper cleaning methods for nebulizers. Review of basic lung anatomy and physiology related to function, structure, and complications of lung disease. Review of risk factors. Discussion about methods for diagnosing sleep apnea and types of masks and machines for OSA. Includes a review of the use of types of environmental controls: home humidity, furnaces, filters, dust mite/pet prevention, HEPA vacuums. Discussion about weather changes, air quality and the benefits of nasal washing. Instruction on Warning signs, infection symptoms, calling MD promptly,  preventive modes, and value of vaccinations. Review of effective airway clearance, coughing and/or vibration techniques. Emphasizing that all should Create an Action Plan. Written material provided at class time.   AED/CPR: - Group verbal and written instruction with the use of models to demonstrate the basic use of the AED with the basic ABC's of resuscitation.    Tests and Procedures:  - Group verbal and visual presentation and models provide information about basic cardiac anatomy and function. Reviews the testing methods done to diagnose heart disease and the outcomes of the test results. Describes the treatment choices: Medical Management, Angioplasty, or Coronary Bypass Surgery for treating various heart conditions including Myocardial Infarction, Angina, Valve Disease, and Cardiac Arrhythmias.  Written material provided at class time.   Medication Safety: - Group verbal and visual instruction to review commonly prescribed medications for heart and lung disease. Reviews the medication, class of the drug, and side effects. Includes the steps to properly store meds and maintain the prescription regimen.  Written material given at graduation.   Other: -Provides group and verbal instruction on various topics (see comments)   Knowledge Questionnaire Score:    Core Components/Risk Factors/Patient Goals at Admission:  Personal Goals and Risk Factors at Admission - 10/21/24 1538       Core Components/Risk Factors/Patient Goals on Admission    Weight Management Yes;Weight Loss;Obesity    Intervention Weight Management: Develop a combined nutrition and exercise program designed to reach desired caloric intake, while maintaining appropriate intake of nutrient and fiber, sodium and fats, and appropriate energy expenditure required for the weight goal.;Weight Management: Provide education and appropriate resources to help participant work on and attain dietary goals.;Weight Management/Obesity:  Establish reasonable short term and long term weight goals.;Obesity: Provide education and appropriate resources to help participant work on and attain dietary goals.    Admit Weight 286 lb 12.8 oz (130.1 kg)    Goal Weight: Short Term 238 lb (108 kg)    Goal Weight: Long Term 190 lb (86.2 kg)    Expected Outcomes Short Term: Continue to assess and modify interventions until short term weight is achieved;Long Term: Adherence to nutrition and physical activity/exercise program aimed toward attainment of established weight goal;Weight Loss: Understanding of general recommendations for a balanced deficit meal plan, which promotes 1-2 lb weight loss per week and includes a negative energy balance of [phone] kcal/d;Understanding recommendations for meals to include 15-35% energy as protein, 25-35% energy from fat, 35-60% energy from carbohydrates, less than 200mg  of dietary cholesterol, 20-35 gm of total fiber daily;Understanding of distribution of calorie intake throughout the day with the consumption of 4-5 meals/snacks    Diabetes Yes    Intervention Provide education about proper nutrition, including hydration, and aerobic/resistive exercise prescription along with prescribed medications to achieve blood glucose in normal ranges: Fasting glucose 65-99 mg/dL;Provide education about signs/symptoms and  action to take for hypo/hyperglycemia.    Expected Outcomes Short Term: Participant verbalizes understanding of the signs/symptoms and immediate care of hyper/hypoglycemia, proper foot care and importance of medication, aerobic/resistive exercise and nutrition plan for blood glucose control.;Long Term: Attainment of HbA1C < 7%.    Hypertension Yes    Intervention Provide education on lifestyle modifcations including regular physical activity/exercise, weight management, moderate sodium restriction and increased consumption of fresh fruit, vegetables, and low fat dairy, alcohol moderation, and smoking  cessation.;Monitor prescription use compliance.    Expected Outcomes Short Term: Continued assessment and intervention until BP is < 140/10mm HG in hypertensive participants. < 130/22mm HG in hypertensive participants with diabetes, heart failure or chronic kidney disease.;Long Term: Maintenance of blood pressure at goal levels.          Education:Diabetes - Individual verbal and written instruction to review signs/symptoms of diabetes, desired ranges of glucose level fasting, after meals and with exercise. Acknowledge that pre and post exercise glucose checks will be done for 3 sessions at entry of program.   Know Your Numbers and Heart Failure: - Group verbal and visual instruction to discuss disease risk factors for cardiac and pulmonary disease and treatment options.  Reviews associated critical values for Overweight/Obesity, Hypertension, Cholesterol, and Diabetes.  Discusses basics of heart failure: signs/symptoms and treatments.  Introduces Heart Failure Zone chart for action plan for heart failure. Written material provided at class time.   Core Components/Risk Factors/Patient Goals Review:    Core Components/Risk Factors/Patient Goals at Discharge (Final Review):    ITP Comments:  ITP Comments     Row Name 10/16/24 1342 10/21/24 1528 10/28/24 1041       ITP Comments Initial phone call completed. Diagnosis can be found in [facility] 12/5. EP Orientation scheduled for Wednesday 1/7 1:30 pm. Completed and gym orientation for respiratory care services. Initial ITP created and sent for review to Dr. Fuad Aleskerov, Medical Director. 30 Day review completed. Medical Director ITP review done, changes made as directed, and signed approval by Medical Director. New to Program        Comments: 30 Day Review      [1]  Current Outpatient Medications:    acetaminophen  (TYLENOL ) 500 MG tablet, Take 500 mg by mouth every 6 (six) hours as needed., Disp: , Rfl:    aspirin  81 MG chewable  tablet, Chew 1 tablet (81 mg total) by mouth daily., Disp: 90 tablet, Rfl: 3   carvedilol  (COREG ) 25 MG tablet, Take 1 tablet (25 mg total) by mouth 2 (two) times daily., Disp: 180 tablet, Rfl: 3   citalopram  (CELEXA ) 20 MG tablet, Take 1 tablet (20 mg total) by mouth daily., Disp: 90 tablet, Rfl: 1   conjugated estrogens  (PREMARIN ) vaginal cream, Place 0.25 Applicatorfuls vaginally daily. Use nightly for 2 weeks, then twice a week as needed., Disp: 42.5 g, Rfl: 12   empagliflozin  (JARDIANCE ) 25 MG TABS tablet, Take 1 tablet (25 mg total) by mouth daily before breakfast. To replace 10mg - place on hold until patient requests please, Disp: 90 tablet, Rfl: 1   gabapentin  (NEURONTIN ) 300 MG capsule, TAKE 1 CAPSULE(300 MG) BY MOUTH THREE TIMES DAILY, Disp: 270 capsule, Rfl: 1   insulin  glargine (LANTUS  SOLOSTAR) 100 UNIT/ML Solostar Pen, Inject 60 Units into the skin at bedtime., Disp: 18 mL, Rfl: 1   insulin  lispro (HUMALOG  KWIKPEN) 100 UNIT/ML KwikPen, Inject 5 Units into the skin 3 (three) times daily. With meals.  Please dispense pens, not vials., Disp: 15  mL, Rfl: 1   Insulin  Pen Needle 31G X 8 MM MISC, Use to inject insulin  4 times daily- please dispense cheapest alternative on patient's insurance, Disp: 400 each, Rfl: 3   metFORMIN  (GLUCOPHAGE ) 1000 MG tablet, Take 1 tablet (1,000 mg total) by mouth 2 (two) times daily with a meal., Disp: 180 tablet, Rfl: 2   metroNIDAZOLE  (FLAGYL ) 500 MG tablet, Take 1 tablet (500 mg total) by mouth 2 (two) times daily., Disp: 14 tablet, Rfl: 0   omeprazole  (PRILOSEC) 20 MG capsule, TAKE 1 CAPSULE(20 MG) BY MOUTH DAILY, Disp: 90 capsule, Rfl: 1   rosuvastatin  (CRESTOR ) 10 MG tablet, TAKE 1 TABLET(10 MG) BY MOUTH DAILY, Disp: 90 tablet, Rfl: 0   sacubitril -valsartan  (ENTRESTO ) 97-103 MG, Take 1 tablet by mouth 2 (two) times daily. Bill [employer]well grant secondary to insurance, please: ID [id number] BIN [id number] PCN [id number] Group [id number], Disp: 60 tablet, Rfl: 0    spironolactone  (ALDACTONE ) 25 MG tablet, TAKE 1 TABLET(25 MG) BY MOUTH DAILY (Patient not taking: Reported on 10/16/2024), Disp: 90 tablet, Rfl: 1   tirzepatide  (MOUNJARO ) 7.5 MG/0.5ML Pen, Inject 7.5 mg into the skin once a week., Disp: 2 mL, Rfl: 2   torsemide  (DEMADEX ) 20 MG tablet, Take 2 tablets (40 mg total) by mouth daily for 3 days, THEN 1 tablet (20 mg total) daily. (Patient taking differently: Take 1 tablet (20 mg total) daily.), Disp: 36 tablet, Rfl: 6   triamcinolone  cream (KENALOG ) 0.1 %, Apply 1 Application topically 2 (two) times daily., Disp: 30 g, Rfl: 1   Vericiguat  2.5 MG TABS, Take 1 tablet (2.5 mg total) by mouth daily., Disp: 30 tablet, Rfl: 5   Vericiguat  5 MG TABS, Take 1 tablet (5 mg total) by mouth daily as needed., Disp: 30 tablet, Rfl: 11 [2]  Social History Tobacco Use  Smoking Status Never  Smokeless Tobacco Never

## 2024-10-29 ENCOUNTER — Encounter: Admitting: *Deleted

## 2024-10-29 ENCOUNTER — Telehealth: Payer: Self-pay | Admitting: Family

## 2024-10-29 DIAGNOSIS — I5022 Chronic systolic (congestive) heart failure: Secondary | ICD-10-CM

## 2024-10-29 LAB — GLUCOSE, CAPILLARY
Glucose-Capillary: 87 mg/dL (ref 70–99)
Glucose-Capillary: 95 mg/dL (ref 70–99)

## 2024-10-29 NOTE — Progress Notes (Signed)
 "  Advanced Heart Failure Clinic Note    PCP: Melvin Pao, NP Cardiology: Dr. Perla HF Cardiology: Dr. Rolan  Chief complaint: shortness of breath   HPI:  [name] is a 64 y.o. female with history of OSA, type 2 diabetes, CSF leak requiring VP shunt, and nonischemic cardiomyopathy was referred by Dr. Gollan for CHF clinic evaluation.  Patient's cardiomyopathy was diagnosed in 2018 in [place], cath at that time showed no significant CAD.  She subsequently relocated to [facility].  She was admitted to [facility] in 4/23 with PNA and CHF, echo showed EF 40-45%.  Coronary CTA done in 5/23 showed no CAD, calcium  score 0.  Cardiac MRI in 10/23 showed EF 32%, no LGE, RV EF 47%.  Echo in 1/24 showed EF 40-45%, mild LV dilation, normal RV, moderate MR, IVC normal.  Patient does not have a strong family history of cardiomyopathy, her father had CHF but developed after he had renal failure requiring HD.  No drinking or drugs.    CPX in 4/24 showed submaximal study with moderate lung restriction, no significant HF limitation, primarily limited by obesity/deconditioning.   Echo in 1/25 showed EF 30-35%, moderate LV dilation, normal RV size/systolic function, moderate-severe MR.   She returns today for a HF follow-up visit with a chief complaint of shortness of breath. Has associated fatigue, intermittent chest pain, occasional left ankle swelling, depression. Not sleeping at night but does sleep some during the day. Denies palpitations, dizziness. Her dad passed away 08-25-25 and she admits that she's still grieving and is asking about support groups or counseling. She fell 2-3 weeks ago in driveway. Says that her right ankle gave way. No injury that she is aware of. She voices great frustration with her inability to lose weight.   Has started pulmonary rehab.   Labs (10/23): LDL 91, K 4.4, creatinine 9.06 Labs (2/24): LDL 41, BNP 38, K 3.7, creatinine 1.11 Labs (5/24): K 3.8, creatinine 1.12 Labs (11/24):  LDL 42 Labs (3/25): K 4.6, creatinine 1.04 Labs (9/25): K 4.7=>5.2, creatinine 1.02, glucose 302=>320, LFT's normal, BNP 17.4, LDL 54, A1c 15.4%  ECG (01/26): NSR, LVH  PMH: 1. OSA: Does not tolerate CPAP.  2. Type 2 diabetes 3. CSF leak: Repairs in 2014 and 2017, VP shunt placed.  4. H/o spinal fusion 5. Chronic systolic CHF: nonischemic cardiomyopathy.  Diagnosed in [place] around 2018.  - Echo (2018): EF 45%.  - LHC (2018): No significant CAD.  - Echo (8/19): EF 45% - Echo (7/22): EF 55% - Echo (4/23): EF 40-45%, normal RV.  - Coronary CTA (5/23): Coronary artery calcium  score 0, no CAD.  - Echo (9/23): EF 35-40%, normal RV - cMRI (10/23): EF 32%, no LGE, RV EF 47%.  - Echo (1/24): EF 40-45%, mild LV dilation, normal RV, moderate MR, IVC normal.  - CPX (4/24): Submaximal with moderate lung restriction in setting of obesity, no significant HF limitation but she has a moderate functional limitation due to obesity/deconditioning.  - Echo (1/25): EF 30-35%, moderate LV dilation, normal RV size/systolic function, moderate-severe MR.  - Echo (11/25): EF 45-50%, G1DD, normal RV, mild MR 6. ABIs (8/24) normal  SH: From [place], former [employer] now on disability.  No smoking, ETOH, drugs.   FH: Father with ESRD and CHF, siblings with no cardiac issues.   ROS: All systems reviewed and negative except as per HPI.   Current Outpatient Medications  Medication Sig Dispense Refill   acetaminophen  (TYLENOL ) 500 MG tablet Take 500 mg  by mouth every 6 (six) hours as needed.     aspirin  81 MG chewable tablet Chew 1 tablet (81 mg total) by mouth daily. 90 tablet 3   carvedilol  (COREG ) 25 MG tablet Take 1 tablet (25 mg total) by mouth 2 (two) times daily. 180 tablet 3   citalopram  (CELEXA ) 20 MG tablet Take 1 tablet (20 mg total) by mouth daily. 90 tablet 1   conjugated estrogens  (PREMARIN ) vaginal cream Place 0.25 Applicatorfuls vaginally daily. Use nightly for 2 weeks, then twice a week as needed. 42.5 g 12    empagliflozin  (JARDIANCE ) 25 MG TABS tablet Take 1 tablet (25 mg total) by mouth daily before breakfast. To replace 10mg - place on hold until patient requests please 90 tablet 1   gabapentin  (NEURONTIN ) 300 MG capsule TAKE 1 CAPSULE(300 MG) BY MOUTH THREE TIMES DAILY 270 capsule 1   insulin  glargine (LANTUS  SOLOSTAR) 100 UNIT/ML Solostar Pen Inject 60 Units into the skin at bedtime. 18 mL 1   insulin  lispro (HUMALOG  KWIKPEN) 100 UNIT/ML KwikPen Inject 5 Units into the skin 3 (three) times daily. With meals.  Please dispense pens, not vials. 15 mL 1   Insulin  Pen Needle 31G X 8 MM MISC Use to inject insulin  4 times daily- please dispense cheapest alternative on patient's insurance 400 each 3   metFORMIN  (GLUCOPHAGE ) 1000 MG tablet Take 1 tablet (1,000 mg total) by mouth 2 (two) times daily with a meal. 180 tablet 2   metroNIDAZOLE  (FLAGYL ) 500 MG tablet Take 1 tablet (500 mg total) by mouth 2 (two) times daily. 14 tablet 0   omeprazole  (PRILOSEC) 20 MG capsule TAKE 1 CAPSULE(20 MG) BY MOUTH DAILY 90 capsule 1   rosuvastatin  (CRESTOR ) 10 MG tablet TAKE 1 TABLET(10 MG) BY MOUTH DAILY 90 tablet 0   sacubitril -valsartan  (ENTRESTO ) 97-103 MG Take 1 tablet by mouth 2 (two) times daily. Bill [employer]well grant secondary to insurance, please: ID [id number] BIN [id number] PCN [id number] Group [id number] 60 tablet 0   spironolactone  (ALDACTONE ) 25 MG tablet TAKE 1 TABLET(25 MG) BY MOUTH DAILY (Patient not taking: Reported on 10/16/2024) 90 tablet 1   tirzepatide  (MOUNJARO ) 7.5 MG/0.5ML Pen Inject 7.5 mg into the skin once a week. 2 mL 2   torsemide  (DEMADEX ) 20 MG tablet Take 2 tablets (40 mg total) by mouth daily for 3 days, THEN 1 tablet (20 mg total) daily. (Patient taking differently: Take 1 tablet (20 mg total) daily.) 36 tablet 6   triamcinolone  cream (KENALOG ) 0.1 % Apply 1 Application topically 2 (two) times daily. 30 g 1   Vericiguat  2.5 MG TABS Take 1 tablet (2.5 mg total) by mouth daily. 30 tablet 5   Vericiguat  5  MG TABS Take 1 tablet (5 mg total) by mouth daily as needed. 30 tablet 11   No current facility-administered medications for this visit.   Vitals:   10/30/24 0949  BP: 112/76  Pulse: 78  SpO2: 98%  Weight: 287 lb 9.6 oz (130.5 kg)   Wt Readings from Last 3 Encounters:  10/30/24 287 lb 9.6 oz (130.5 kg)  10/30/24 287 lb (130.2 kg)  10/21/24 286 lb 12.8 oz (130.1 kg)   Lab Results  Component Value Date   CREATININE 1.02 (H) 07/08/2024   CREATININE 1.01 (H) 07/03/2024   CREATININE 1.04 (H) 12/27/2023     Physical Exam: General: Well appearing.  Cor: No JVD. Regular rhythm, rate.  Lungs: clear Abdomen: soft, nontender, nondistended. Extremities: trace pitting edema left lower leg Neuro:. Affect pleasant,  tearful when talking about her dad    Assessment/Plan: 1. Chronic systolic CHF: Nonischemic cardiomyopathy.  Cath in 2018 and coronary CTA in 5/23 showed no coronary disease.  Cardiac MRI in 10/23 showed  EF 32%, no LGE, RV EF 47%, and echo in 1/24 showed EF 40-45%, mild LV dilation, normal RV, moderate MR, IVC normal.  She does not have a history of ETOH or drug abuse.  She does not have a strong family history of cardiomyopathy.  CPX in 4/24 suggests that her predominant limitation is obesity/deconditioning. Echo in 1/25 showed EF 30-35%, moderate LV dilation, normal RV size/systolic function, moderate-severe MR. Echo 11/25: EF 45-50%, G1DD, normal RV, mild MR   - Euvolemic although continues to have NYHA class III symptoms.   - weight stable from last visit here 5 weeks ago - Continue Coreg  25 mg bid.  - Continue empagliflozin  25mg  daily (DM dose) - Continue Entresto  97/103 bid - Continue spironolactone  25 daily.  - Refilled above meds today - Continue torsemide  20 mg daily - Would not add Bidil given chronic headaches.  - Continue vericiguat  5mg  daily. EF has improved since starting this medication. - Saw EP 12/25 & since EF has improved, she currently doesn't meet  criteria for ICD or CCM. Could consider repeating cMRI - Has started pulmonary rehab 2. OSA:  Has not been able to tolerate CPAP.  3. Obesity: Frustrated with lack of weight loss  - Continue mounjaro  7.5mg  weekly - suspect her uncontrolled DM is playing a role with her weight gain.  4. Mitral regurgitation: Moderate-severe MR on 1/25 echo.  This is worse than the past.  Suspect functional MR.  - MR mild on 11/25 echo 5: DM (managed by PCP)- - A1c 07/08/24 was 15.4%. Back in 02/25 it was 6.5% 6: Depression: Father passed away 08-19-25 and she admits that she's having a hard time dealing with this grief.  - Contact information for [facility] services provided today. - Discussed also going to [facility] support group as another option - Emotional support given   Return in 5 months, sooner if needed.   I spent 38 minutes reviewing records, interviewing/ examing patient and managing plan/ orders.   Ellouise DELENA Class, FNP 10/29/24  "

## 2024-10-29 NOTE — Telephone Encounter (Signed)
 Called to confirm/remind patient of their appointment at the Advanced Heart Failure Clinic on 10/30/24.   Appointment:   [] Confirmed  [x] Left mess   [] No answer/No voice mail  [] VM Full/unable to leave message  [] Phone not in service  Patient reminded to bring all medications and/or complete list.  Confirmed patient has transportation. Gave directions, instructed to utilize valet parking.

## 2024-10-29 NOTE — Progress Notes (Signed)
 Incomplete Session Note  Patient Details  Name: Emily Hinton MRN: [id number] Date of Birth: [birth year] Referring Provider:   Flowsheet Row Pulmonary Rehab from 10/21/2024 in [facility] Cardiac and Pulmonary Rehab  Referring Provider Emily Barrack, MD    [name] did not complete her rehab session.  sent home for blood sugar too low to exercise Blood sugar was up to 95mg /dl before being discharged home. Had not had food since 10 Am. Reminded to have a snack prior to class for all her sessions.

## 2024-10-30 ENCOUNTER — Telehealth: Payer: Self-pay

## 2024-10-30 ENCOUNTER — Ambulatory Visit: Attending: Physician Assistant | Admitting: Physician Assistant

## 2024-10-30 ENCOUNTER — Other Ambulatory Visit: Payer: Self-pay

## 2024-10-30 ENCOUNTER — Encounter: Payer: Self-pay | Admitting: Family

## 2024-10-30 ENCOUNTER — Encounter: Payer: Self-pay | Admitting: Physician Assistant

## 2024-10-30 ENCOUNTER — Encounter

## 2024-10-30 ENCOUNTER — Ambulatory Visit: Attending: Family | Admitting: Family

## 2024-10-30 ENCOUNTER — Other Ambulatory Visit: Payer: Self-pay | Admitting: Family

## 2024-10-30 VITALS — BP 114/78 | HR 81 | Ht 71.0 in | Wt 287.0 lb

## 2024-10-30 VITALS — BP 112/76 | HR 78 | Wt 287.6 lb

## 2024-10-30 DIAGNOSIS — Z634 Disappearance and death of family member: Secondary | ICD-10-CM | POA: Diagnosis not present

## 2024-10-30 DIAGNOSIS — E66813 Obesity, class 3: Secondary | ICD-10-CM

## 2024-10-30 DIAGNOSIS — F32A Depression, unspecified: Secondary | ICD-10-CM | POA: Insufficient documentation

## 2024-10-30 DIAGNOSIS — E669 Obesity, unspecified: Secondary | ICD-10-CM | POA: Insufficient documentation

## 2024-10-30 DIAGNOSIS — I10 Essential (primary) hypertension: Secondary | ICD-10-CM | POA: Diagnosis not present

## 2024-10-30 DIAGNOSIS — I5022 Chronic systolic (congestive) heart failure: Secondary | ICD-10-CM | POA: Insufficient documentation

## 2024-10-30 DIAGNOSIS — Z79899 Other long term (current) drug therapy: Secondary | ICD-10-CM | POA: Insufficient documentation

## 2024-10-30 DIAGNOSIS — I340 Nonrheumatic mitral (valve) insufficiency: Secondary | ICD-10-CM | POA: Insufficient documentation

## 2024-10-30 DIAGNOSIS — Z7984 Long term (current) use of oral hypoglycemic drugs: Secondary | ICD-10-CM | POA: Insufficient documentation

## 2024-10-30 DIAGNOSIS — Z7985 Long-term (current) use of injectable non-insulin antidiabetic drugs: Secondary | ICD-10-CM | POA: Insufficient documentation

## 2024-10-30 DIAGNOSIS — E1165 Type 2 diabetes mellitus with hyperglycemia: Secondary | ICD-10-CM | POA: Diagnosis not present

## 2024-10-30 DIAGNOSIS — I428 Other cardiomyopathies: Secondary | ICD-10-CM

## 2024-10-30 DIAGNOSIS — E1169 Type 2 diabetes mellitus with other specified complication: Secondary | ICD-10-CM

## 2024-10-30 DIAGNOSIS — Z794 Long term (current) use of insulin: Secondary | ICD-10-CM | POA: Insufficient documentation

## 2024-10-30 DIAGNOSIS — I2510 Atherosclerotic heart disease of native coronary artery without angina pectoris: Secondary | ICD-10-CM | POA: Diagnosis not present

## 2024-10-30 DIAGNOSIS — G4733 Obstructive sleep apnea (adult) (pediatric): Secondary | ICD-10-CM | POA: Insufficient documentation

## 2024-10-30 DIAGNOSIS — Z6841 Body Mass Index (BMI) 40.0 and over, adult: Secondary | ICD-10-CM | POA: Diagnosis not present

## 2024-10-30 DIAGNOSIS — Z6838 Body mass index (BMI) 38.0-38.9, adult: Secondary | ICD-10-CM

## 2024-10-30 DIAGNOSIS — F329 Major depressive disorder, single episode, unspecified: Secondary | ICD-10-CM

## 2024-10-30 LAB — GLUCOSE, CAPILLARY
Glucose-Capillary: 82 mg/dL (ref 70–99)
Glucose-Capillary: 92 mg/dL (ref 70–99)
Glucose-Capillary: 83 mg/dL (ref 70–99)

## 2024-10-30 MED ORDER — EMPAGLIFLOZIN 25 MG PO TABS: 25.0000 mg | ORAL_TABLET | Freq: Every day | ORAL | 1 refills | Status: AC

## 2024-10-30 MED ORDER — SPIRONOLACTONE 25 MG PO TABS: 25.0000 mg | ORAL_TABLET | Freq: Every day | ORAL | 3 refills | Status: AC

## 2024-10-30 MED ORDER — CARVEDILOL 25 MG PO TABS: 25.0000 mg | ORAL_TABLET | Freq: Two times a day (BID) | ORAL | 1 refills | Status: AC

## 2024-10-30 MED ORDER — SACUBITRIL-VALSARTAN 97-103 MG PO TABS: 1.0000 | ORAL_TABLET | Freq: Two times a day (BID) | ORAL | 1 refills | Status: AC

## 2024-10-30 MED ORDER — SPIRONOLACTONE 25 MG PO TABS: 25.0000 mg | ORAL_TABLET | Freq: Once | ORAL | 1 refills | Status: DC

## 2024-10-30 MED ORDER — VERICIGUAT 5 MG PO TABS: 5.0000 mg | ORAL_TABLET | Freq: Every day | ORAL | 11 refills | Status: AC

## 2024-10-30 NOTE — Progress Notes (Signed)
" ° °  10/30/2024 Name: Emily Hinton- Gilzene MRN: [id number] DOB: [birth year]  Returning missed call/voicemail from patient in regard to concern with some low blood sugar readings    Subjective:  Diabetes: Current medications: Mounjaro  7.5mg  weekly, Jardiance  25mg  daily, Lantus  60 units at bedtime, Hunalog 5 units TID with meals, metformin  1000mg  BID -Patient states the past 2 days while at pulmonary rehab her BG has dropped to the low 80's causing s/sx of hypoglycemia.  She has take glucose tablets, but these have only brought her BG up to the high 80's or low 90's.  She was sent home yesterday from pulmonary rehab due to this. -Verified how patient is currently using insulin , that she does not use Novolog  without eating, and that she is having breakfast before going to rehab appointments -FBG has also been running low, 80-82 this week  Macrovascular and Microvascular Risk Reduction:  Statin? yes (rosuvastatin  10mg  daily); ACEi/ARB? yes (Entresto  97/103 BID) Last urinary albumin/creatinine ratio:  Lab Results  Component Value Date   MICRALBCREAT 30-300 (H) 12/07/2022   Last eye exam:  Lab Results  Component Value Date   HMDIABEYEEXA No Retinopathy 08/06/2022   Last foot exam: 08/14/2022 Tobacco Use:  Tobacco Use: Low Risk (10/30/2024)   Patient History    Smoking Tobacco Use: Never    Smokeless Tobacco Use: Never    Passive Exposure: Not on file   Objective: Lab Results  Component Value Date   HGBA1C 15.4 (H) 07/08/2024   Lab Results  Component Value Date   CREATININE 1.02 (H) 07/08/2024   BUN 10 07/08/2024   NA 136 07/08/2024   K 5.2 07/08/2024   CL 94 (L) 07/08/2024   CO2 25 07/08/2024   Lab Results  Component Value Date   CHOL 127 07/03/2024   HDL 55 07/03/2024   LDLCALC 54 07/03/2024   TRIG 97 07/03/2024   CHOLHDL 2.3 07/03/2024   Assessment/Plan:   Diabetes: -Advised patient to decrease Lantus  to 50 units at bedtime -Continue Humalog  5 units TID  with meals, Jardiance  25mg  daily, Mounjaro  7.5mg  weekly, and metformin  1000mg  BID  Follow Up Plan: 2 weeks  Channing DELENA Mealing, PharmD, DPLA    "

## 2024-10-30 NOTE — Patient Instructions (Signed)
 Visit Information  Thank you for taking time to visit with me today. Please don't hesitate to contact me if I can be of assistance to you before our next scheduled appointment.  Our next appointment is by telephone on 11/12/24 at 3pm Please call the care guide team at [phone] if you need to cancel or reschedule your appointment.   Following is a copy of your care plan:   Goals Addressed             This Visit's Progress    BSW Goals       Current SDOH Barriers:  Move to own place Eye and dental resources  Interventions: Provided patient with information about affordable homes in [place]. SW provided resource for eye and dental via email.   Thersia Hoar, BSW, MHA [place]  [facility] Social Worker, Population Health [phone]           Please call the Suicide and Crisis Lifeline: 988 call the [place]  National Suicide Prevention Lifeline: 785-817-6490 or TTY: 616-318-7078 TTY 8171200402) to talk to a trained counselor call 1-800-273-TALK (toll free, 24 hour hotline) call 911 if you are experiencing a Mental Health or Behavioral Health Crisis or need someone to talk to.  Patient verbalized understanding of Care plan and visit instructions communicated this visit  Thersia Hoar, BSW, [facility] [place]  [facility] Social Worker, Population Health [phone]

## 2024-10-30 NOTE — Progress Notes (Signed)
 Daily Session Note  Patient Details  Name: Emily Hinton MRN: [id number] Date of Birth: 04-08-1961 Referring Provider:   Flowsheet Row Pulmonary Rehab from 10/21/2024 in [facility] Cardiac and Pulmonary Rehab  Referring Provider Rolan Barrack, MD    Encounter Date: 10/30/2024  Check In:  Session Check In - 10/30/24 1105       Check-In   Supervising physician immediately available to respond to emergencies See telemetry face sheet for immediately available ER MD    Location [facility]-Cardiac & Pulmonary Rehab    Staff Present Burnard Davenport RN,BSN,MPA;Maxon Conetta BS, Exercise Physiologist;Joseph Hood RCP,RRT,BSRT;Noah Tickle, BS, Exercise Physiologist    Virtual Visit No    Medication changes reported     No    Fall or balance concerns reported    No    Warm-up and Cool-down Performed on first and last piece of equipment    Resistance Training Performed Yes    VAD Patient? No    PAD/SET Patient? No      Pain Assessment   Currently in Pain? No/denies             Tobacco Use History[1]  Goals Met:  Independence with exercise equipment Exercise tolerated well No report of concerns or symptoms today Strength training completed today  Goals Unmet:  Not Applicable  Comments: Pt able to follow exercise prescription today without complaint.  Will continue to monitor for progression.    Dr. Oneil Pinal is Medical Director for [facility].  Dr. Fuad Aleskerov is Medical Director for [facility] Pulmonary Rehabilitation.     [1]  Social History Tobacco Use  Smoking Status Never  Smokeless Tobacco Never

## 2024-10-30 NOTE — Patient Instructions (Signed)
 Medication Instructions:  Your physician recommends that you continue on your current medications as directed. Please refer to the Current Medication list given to you today.  *If you need a refill on your cardiac medications before your next appointment, please call your pharmacy*  Lab Work: No labs ordered today  If you have labs (blood work) drawn today and your tests are completely normal, you will receive your results only by: MyChart Message (if you have MyChart) OR A paper copy in the mail If you have any lab test that is abnormal or we need to change your treatment, we will call you to review the results.  Testing/Procedures: No test ordered today   Follow-Up: At [facility], you and your health needs are our priority.  As part of our continuing mission to provide you with exceptional heart care, our providers are all part of one team.  This team includes your primary Cardiologist (physician) and Advanced Practice Providers or APPs (Physician Assistants and Nurse Practitioners) who all work together to provide you with the care you need, when you need it.  Your next appointment:   6 month(s)  Provider:   You may see Timothy Gollan, MD or one of the following Advanced Practice Providers on your designated Care Team:    Bernardino Bring, PA-C   We recommend signing up for the patient portal called MyChart.  Sign up information is provided on this After Visit Summary.  MyChart is used to connect with patients for Virtual Visits (Telemedicine).  Patients are able to view lab/test results, encounter notes, upcoming appointments, etc.  Non-urgent messages can be sent to your provider as well.   To learn more about what you can do with MyChart, go to [email or web address].

## 2024-10-30 NOTE — Patient Instructions (Addendum)
 Medication Changes:  REFILLED Carvedilol , Jardiance , Entresto , Spironolactone .     Special Instructions // Education:  Please follow up with [facility]  [address] [place], [place] [address] [phone]    Follow-Up in: Please follow up with the [facility] in June with Ellouise Class, FNP.   Thank you for choosing [facility].    At the [facility], you and your health needs are our priority. We have a designated team specialized in the treatment of Heart Failure. This Care Team includes your primary Heart Failure Specialized Cardiologist (physician), Advanced Practice Providers (APPs- Physician Assistants and Nurse Practitioners), and Pharmacist who all work together to provide you with the care you need, when you need it.   You may see any of the following providers on your designated Care Team at your next follow up:  Dr. Toribio Fuel Dr. Ezra Shuck Dr. Ria Commander Dr. Morene Brownie Ellouise Class, FNP Jaun Bash, RPH-CPP  Please be sure to bring in all your medications bottles to every appointment.   Need to Contact Us :  If you have any questions or concerns before your next appointment please send us  a message through [place] or call our office at [phone].    TO LEAVE A MESSAGE FOR THE NURSE SELECT OPTION 2, PLEASE LEAVE A MESSAGE INCLUDING: YOUR NAME DATE OF BIRTH CALL BACK NUMBER REASON FOR CALL**this is important as we prioritize the call backs  YOU WILL RECEIVE A CALL BACK THE SAME DAY AS LONG AS YOU CALL BEFORE 4:00 PM

## 2024-10-30 NOTE — Patient Outreach (Signed)
 Social Drivers of Health  Community Resource and Care Coordination Visit Note   10/30/2024  Name: Emily Hinton MRN: [id number] DOB:1961-09-09  Situation: Referral received for SDoH needs assessment and assistance related to Housing  eye and dental resources. I obtained verbal consent from Patient.  Visit completed with Patient on the phone.   Background:      Assessment:   Goals Addressed             This Visit's Progress    BSW Goals       Current SDOH Barriers:  Move to own place Eye and dental resources  Interventions: Provided patient with information about affordable homes in [place]. SW provided resource for eye and dental via email.   Thersia Hoar, HEDWIG, MHA [place]  [facility] Social Worker, Population Health [phone]           Recommendation:   Use resources provided to schedule an eye and dental appointment. Use resources provided to locate a new home.  Follow Up Plan:   Telephone follow-up 11/12/24 at 3pm  Thersia Hoar, BSW, [facility] [place]  Value Based [facility] Social Worker, Population Health [phone]

## 2024-11-05 ENCOUNTER — Encounter: Admitting: *Deleted

## 2024-11-05 DIAGNOSIS — I5022 Chronic systolic (congestive) heart failure: Secondary | ICD-10-CM

## 2024-11-05 LAB — GLUCOSE, CAPILLARY
Glucose-Capillary: 135 mg/dL — ABNORMAL HIGH (ref 70–99)
Glucose-Capillary: 86 mg/dL (ref 70–99)

## 2024-11-05 NOTE — Progress Notes (Signed)
 Daily Session Note  Patient Details  Name: Emily Hinton MRN: [id number] Date of Birth: 15-Nov-1960 Referring Provider:   Flowsheet Row Pulmonary Rehab from 10/21/2024 in [facility] Cardiac and Pulmonary Rehab  Referring Provider Rolan Barrack, MD    Encounter Date: 11/05/2024  Check In:  Session Check In - 11/05/24 1358       Check-In   Supervising physician immediately available to respond to emergencies See telemetry face sheet for immediately available ER MD    Location [facility]-Cardiac & Pulmonary Rehab    Staff Present Hoy Rodney RN,BSN;Margaret Best, MS, Exercise Physiologist;Noah Tickle, BS, Exercise Physiologist;Joseph Rolinda RCP,RRT,BSRT    Virtual Visit No    Medication changes reported     No    Fall or balance concerns reported    No    Warm-up and Cool-down Performed on first and last piece of equipment    Resistance Training Performed Yes    VAD Patient? No    PAD/SET Patient? No      Pain Assessment   Currently in Pain? No/denies             Tobacco Use History[1]  Goals Met:  Independence with exercise equipment Exercise tolerated well No report of concerns or symptoms today Strength training completed today  Goals Unmet:  Not Applicable  Comments: Pt able to follow exercise prescription today without complaint.  Will continue to monitor for progression.    Dr. Oneil Pinal is Medical Director for [facility] Cardiac Rehabilitation.  Dr. Fuad Aleskerov is Medical Director for [facility] Pulmonary Rehabilitation.    [1]  Social History Tobacco Use  Smoking Status Never  Smokeless Tobacco Never

## 2024-11-06 ENCOUNTER — Encounter

## 2024-11-06 DIAGNOSIS — I5022 Chronic systolic (congestive) heart failure: Secondary | ICD-10-CM | POA: Diagnosis not present

## 2024-11-06 LAB — GLUCOSE, CAPILLARY
Glucose-Capillary: 149 mg/dL — ABNORMAL HIGH (ref 70–99)
Glucose-Capillary: 119 mg/dL — ABNORMAL HIGH (ref 70–99)

## 2024-11-06 NOTE — Progress Notes (Signed)
 Daily Session Note  Patient Details  Name: Emily Hinton MRN: [id number] Date of Birth: 11/05/1960 Referring Provider:   Flowsheet Row Pulmonary Rehab from 10/21/2024 in [facility] Cardiac and Pulmonary Rehab  Referring Provider Rolan Barrack, MD    Encounter Date: 11/06/2024  Check In:  Session Check In - 11/06/24 1123       Check-In   Supervising physician immediately available to respond to emergencies See telemetry face sheet for immediately available ER MD    Location [facility]-Cardiac & Pulmonary Rehab    Staff Present Burnard Davenport RN,BSN,MPA;Joseph Hood RCP,RRT,BSRT;Noah Tickle, [place], Exercise Physiologist;Maxon Conetta BS, Exercise Physiologist    Virtual Visit No    Medication changes reported     No    Fall or balance concerns reported    No    Warm-up and Cool-down Performed on first and last piece of equipment    Resistance Training Performed Yes    VAD Patient? No    PAD/SET Patient? No      Pain Assessment   Currently in Pain? No/denies             Tobacco Use History[1]  Goals Met:  Proper associated with RPD/PD & O2 Sat Independence with exercise equipment Using PLB without cueing & demonstrates good technique Exercise tolerated well No report of concerns or symptoms today Strength training completed today  Goals Unmet:  Not Applicable  Comments: Pt able to follow exercise prescription today without complaint.  Will continue to monitor for progression.    Dr. Oneil Pinal is Medical Director for [facility] Cardiac Rehabilitation.  Dr. Fuad Aleskerov is Medical Director for [facility] Pulmonary Rehabilitation.    [1]  Social History Tobacco Use  Smoking Status Never  Smokeless Tobacco Never

## 2024-11-06 NOTE — Progress Notes (Signed)
 Emily Hinton                                          MRN: [id number]   11/06/2024   The VBCI Quality Team Specialist reviewed this patient medical record for the purposes of chart review for care gap closure. The following were reviewed: chart review for care gap closure-glycemic status assessment.    VBCI Quality Team

## 2024-11-09 ENCOUNTER — Other Ambulatory Visit: Payer: Self-pay

## 2024-11-09 NOTE — Patient Instructions (Signed)
 Visit Information  Thank you for taking time to visit with me today. Please don't hesitate to contact me if I can be of assistance to you before our next scheduled appointment.  Your next care management appointment is by telephone on 11/30/2024 at 2:00 PM   Please call the care guide team at [phone] if you need to cancel, schedule, or reschedule an appointment.   Please call 911 if you are experiencing a Mental Health or Behavioral Health Crisis or need someone to talk to.  Olam Ally, MSW, LCSW [place]  [facility], [facility] Licensed Clinical Social Worker Direct Dial : [phone]

## 2024-11-09 NOTE — Patient Outreach (Signed)
 Complex Care Management   Visit Note  11/09/2024  Name:  Emily Hinton MRN: [id number] DOB: Dec 18, 1960  Situation: Referral received for Complex Care Management related to SDOH Barriers:  Housing patient is wanting a place of her own. I obtained verbal consent from Patient.  Visit completed with Patient  on the phone. Patient reports that she will try to have eye doctor appointment by next outreach and she will continue to review housing list for viable options. Background:   Past Medical History:  Diagnosis Date   CHF (congestive heart failure) (HCC)    CSF leak    Diabetes mellitus without complication (HCC)    DM2 (diabetes mellitus, type 2) (HCC)    Essential hypertension    Heart attack (HCC)    Hyperlipidemia    Morbid obesity (HCC)    NICM (nonischemic cardiomyopathy) (HCC)    Nonobstructive atherosclerosis of coronary artery    OSA (obstructive sleep apnea)     Assessment: Patient Reported Symptoms:  Cognitive Cognitive Status: Normal speech and language skills, Alert and oriented to person, place, and time, Able to follow simple commands Cognitive/Intellectual Conditions Management [RPT]: None reported or documented in medical history or problem list      Neurological Neurological Review of Symptoms: Not assessed    HEENT HEENT Symptoms Reported: Not assessed      Cardiovascular Cardiovascular Symptoms Reported: No symptoms reported Cardiovascular Comment: Patient reports starting cardiac rehab twice a week patient reports helping  Respiratory Respiratory Symptoms Reported: Not assesed    Endocrine Endocrine Symptoms Reported: Not assessed    Gastrointestinal Gastrointestinal Symptoms Reported: Not assessed      Genitourinary Genitourinary Symptoms Reported: Not assessed    Integumentary Integumentary Symptoms Reported: Not assessed    Musculoskeletal Musculoskelatal Symptoms Reviewed: Not assessed        Psychosocial Psychosocial Symptoms  Reported: Anxiety - if selected complete GAD Additional Psychological Details: Patient reports anxiety is a little higher due to driving, patient reports not driving right now Behavioral Management Strategies: Adequate rest (Patient has decided not to drive for now) Behavioral Health Comment: Patient reports just having anxiety when driving Major Change/Loss/Stressor/Fears (CP): Environment Techniques to Cope with Loss/Stress/Change: Meditation Quality of Family Relationships: stressful, helpful, involved Do you feel physically threatened by others?: No    11/09/2024    PHQ2-9 Depression Screening   Little interest or pleasure in doing things    Feeling down, depressed, or hopeless    PHQ-2 - Total Score    Trouble falling or staying asleep, or sleeping too much    Feeling tired or having little energy    Poor appetite or overeating     Feeling bad about yourself - or that you are a failure or have let yourself or your family down    Trouble concentrating on things, such as reading the newspaper or watching television    Moving or speaking so slowly that other people could have noticed.  Or the opposite - being so fidgety or restless that you have been moving around a lot more than usual    Thoughts that you would be better off dead, or hurting yourself in some way    PHQ2-9 Total Score    If you checked off any problems, how difficult have these problems made it for you to do your work, take care of things at home, or get along with other people    Depression Interventions/Treatment      There were no vitals filed for this  visit.    Medications Reviewed Today     Reviewed by Sherren Olam FORBES KEN (Social Worker) on 11/09/24 at 1416  Med List Status: <None>   Medication Order Taking? Sig Documenting Provider Last Dose Status Informant  acetaminophen  (TYLENOL ) 500 MG tablet [id number]  Take 500 mg by mouth every 6 (six) hours as needed. [provider]  Active   aspirin  81  MG chewable tablet [id number]  Chew 1 tablet (81 mg total) by mouth daily. Abigail Bernardino HERO, PA-C  Active   carvedilol  (COREG ) 25 MG tablet [id number]  Take 1 tablet (25 mg total) by mouth 2 (two) times daily. [place] A, FNP  Active   citalopram  (CELEXA ) 20 MG tablet [id number]  Take 1 tablet (20 mg total) by mouth daily. Melvin Pao, NP  Active   conjugated estrogens  (PREMARIN ) vaginal cream [id number]  Place 0.25 Applicatorfuls vaginally daily. Use nightly for 2 weeks, then twice a week as needed. Justino Eleanor HERO, CNM  Active   empagliflozin  (JARDIANCE ) 25 MG TABS tablet [id number]  Take 1 tablet (25 mg total) by mouth daily before breakfast. To replace 10mg - place on hold until patient requests please [place] LABOR, FNP  Active   gabapentin  (NEURONTIN ) 300 MG capsule [id number]  TAKE 1 CAPSULE(300 MG) BY MOUTH THREE TIMES DAILY Melvin Pao, NP  Active   insulin  glargine (LANTUS  SOLOSTAR) 100 UNIT/ML Solostar Pen [id number]  Inject 60 Units into the skin at bedtime.  Patient taking differently: Inject 50 Units into the skin at bedtime.   Melvin Pao, NP  Active   insulin  lispro (HUMALOG  KWIKPEN) 100 UNIT/ML KwikPen [id number]  Inject 5 Units into the skin 3 (three) times daily. With meals.  Please dispense pens, not vials. Melvin Pao, NP  Active   Insulin  Pen Needle 31G X 8 MM MISC [id number]  Use to inject insulin  4 times daily- please dispense cheapest alternative on patient's insurance Melvin Pao, NP  Active   metFORMIN  (GLUCOPHAGE ) 1000 MG tablet [id number]  Take 1 tablet (1,000 mg total) by mouth 2 (two) times daily with a meal. Melvin Pao, NP  Active   metroNIDAZOLE  (FLAGYL ) 500 MG tablet [id number]  Take 1 tablet (500 mg total) by mouth 2 (two) times daily. Justino Eleanor HERO, CNM  Active   omeprazole  (PRILOSEC) 20 MG capsule [id number]  TAKE 1 CAPSULE(20 MG) BY MOUTH DAILY Melvin Pao, NP  Active   rosuvastatin  (CRESTOR ) 10 MG tablet [id number]  TAKE 1  TABLET(10 MG) BY MOUTH DAILY Melvin Pao, NP  Active   sacubitril -valsartan  (ENTRESTO ) 97-103 MG [id number]  Take 1 tablet by mouth 2 (two) times daily. [employer] secondary to insurance, please: ID [id number] BIN [id number] PCN [id number] Group [id number] [place] LABOR, FNP  Active   spironolactone  (ALDACTONE ) 25 MG tablet [id number]  Take 1 tablet (25 mg total) by mouth daily. [place] A, FNP  Active   tirzepatide  (MOUNJARO ) 7.5 MG/0.5ML Pen [id number]  Inject 7.5 mg into the skin once a week. [place] A, FNP  Active   torsemide  (DEMADEX ) 20 MG tablet [id number]  Take 2 tablets (40 mg total) by mouth daily for 3 days, THEN 1 tablet (20 mg total) daily. Abigail Bernardino HERO, PA-C  Active   triamcinolone  cream (KENALOG ) 0.1 % [id number]  Apply 1 Application topically 2 (two) times daily. Melvin Pao, NP  Active   Vericiguat  5 MG TABS [id number]  Take 1 tablet (5 mg total) by mouth daily. [place] LABOR, FNP  Active  Recommendation:   PCP Follow-up Continue Current Plan of Care  Follow Up Plan:   Telephone follow-up LCSW 11/30/2024 at 2:00 PM.  Olam Ally, MSW, LCSW [place]  [facility] Institute, [facility] Licensed Clinical Social Worker Direct Dial : [phone]

## 2024-11-12 ENCOUNTER — Encounter: Admitting: *Deleted

## 2024-11-12 ENCOUNTER — Other Ambulatory Visit: Payer: Self-pay

## 2024-11-12 DIAGNOSIS — I5022 Chronic systolic (congestive) heart failure: Secondary | ICD-10-CM | POA: Diagnosis not present

## 2024-11-12 NOTE — Progress Notes (Signed)
 Daily Session Note  Patient Details  Name: Kortnie Stovall MRN: [id number] Date of Birth: 12-Mar-1961 Referring Provider:   Flowsheet Row Pulmonary Rehab from 10/21/2024 in [facility] Cardiac and Pulmonary Rehab  Referring Provider Rolan Barrack, MD    Encounter Date: 11/12/2024  Check In:  Session Check In - 11/12/24 1410       Check-In   Supervising physician immediately available to respond to emergencies See telemetry face sheet for immediately available ER MD    Location [facility]-Cardiac & Pulmonary Rehab    Staff Present Hoy Rodney RN,BSN;Maxon Burnell BS, Exercise Physiologist;Joseph Hood RCP,RRT,BSRT;Noah Tickle, [place], Exercise Physiologist    Virtual Visit No    Medication changes reported     No    Fall or balance concerns reported    No    Warm-up and Cool-down Performed on first and last piece of equipment    Resistance Training Performed Yes    VAD Patient? No    PAD/SET Patient? No      Pain Assessment   Currently in Pain? No/denies             Tobacco Use History[1]  Goals Met:  Independence with exercise equipment Exercise tolerated well No report of concerns or symptoms today Strength training completed today  Goals Unmet:  Not Applicable  Comments: Pt able to follow exercise prescription today without complaint.  Will continue to monitor for progression.    Dr. Oneil Pinal is Medical Director for [facility] Cardiac Rehabilitation.  Dr. Fuad Aleskerov is Medical Director for [facility] Pulmonary Rehabilitation.    [1]  Social History Tobacco Use  Smoking Status Never  Smokeless Tobacco Never

## 2024-11-12 NOTE — Patient Outreach (Signed)
 Social Drivers of Health  Community Resource and Care Coordination Visit Note   11/12/2024  Name: Lil Lepage MRN: [id number] DOB:11-Sep-1961  Situation: Referral received for SDoH needs assessment and assistance related to Housing  eye and dental resources. I obtained verbal consent from Patient.  Visit completed with Patient on the phone.   Background:      Assessment:   Goals Addressed             This Visit's Progress    COMPLETED: BSW Goals       Current SDOH Barriers:  Move to own place Eye and dental resources  Interventions: Provided patient with information about affordable homes in [place]. SW provided resource for eye and dental via email.   Thersia Hoar, HEDWIG, MHA [place]  [facility] Social Worker, Population Health [phone]           Recommendation:   Continue to contact eye and dental appointments for a sooner appointment.  Follow Up Plan:   Patient has achieved all patient stated goals. [employer] will be closed. Patient has been provided contact information should new needs arise.   Thersia Hoar, HEDWIG, MHA [place]  [facility] Social Worker, Population Health [phone]

## 2024-11-12 NOTE — Patient Instructions (Signed)

## 2024-11-13 ENCOUNTER — Encounter

## 2024-11-13 ENCOUNTER — Other Ambulatory Visit (INDEPENDENT_AMBULATORY_CARE_PROVIDER_SITE_OTHER): Payer: Self-pay

## 2024-11-13 DIAGNOSIS — I5022 Chronic systolic (congestive) heart failure: Secondary | ICD-10-CM

## 2024-11-13 DIAGNOSIS — I5020 Unspecified systolic (congestive) heart failure: Secondary | ICD-10-CM

## 2024-11-13 DIAGNOSIS — I10 Essential (primary) hypertension: Secondary | ICD-10-CM

## 2024-11-13 DIAGNOSIS — E1169 Type 2 diabetes mellitus with other specified complication: Secondary | ICD-10-CM

## 2024-11-13 NOTE — Progress Notes (Signed)
 Daily Session Note  Patient Details  Name: Emily Hinton MRN: [id number] Date of Birth: [birth year] Referring Provider:   Flowsheet Row Pulmonary Rehab from 10/21/2024 in [facility] Cardiac and Pulmonary Rehab  Referring Provider Rolan Barrack, MD    Encounter Date: 11/13/2024  Check In:  Session Check In - 11/13/24 1111       Check-In   Supervising physician immediately available to respond to emergencies See telemetry face sheet for immediately available ER MD    Location [facility]-Cardiac & Pulmonary Rehab    Staff Present Burnard Davenport RN,BSN,MPA;Joseph [facility] RCP,RRT,BSRT;Laura Cates RN,BSN;Noah Tickle, [place], Exercise Physiologist    Virtual Visit No    Medication changes reported     No    Fall or balance concerns reported    No    Warm-up and Cool-down Performed on first and last piece of equipment    Resistance Training Performed Yes    VAD Patient? No    PAD/SET Patient? No      Pain Assessment   Currently in Pain? No/denies             Tobacco Use History[1]  Goals Met:  Proper associated with RPD/PD & O2 Sat Independence with exercise equipment Using PLB without cueing & demonstrates good technique Exercise tolerated well No report of concerns or symptoms today Strength training completed today  Goals Unmet:  Not Applicable  Comments: Pt able to follow exercise prescription today without complaint.  Will continue to monitor for progression.    Dr. Oneil Pinal is Medical Director for [facility] Rehabilitation.  Dr. Fuad Aleskerov is Medical Director for [facility] Pulmonary Rehabilitation.    [1]  Social History Tobacco Use  Smoking Status Never  Smokeless Tobacco Never

## 2024-11-13 NOTE — Progress Notes (Signed)
" ° °  11/13/2024 Name: Emily Hinton MRN: [id number] DOB: [birth year]  Chief Complaint  Patient presents with   Diabetes Management Plan   Emily Hinton is a 64 y.o. year old female who presented for a telephone follow-up visit.   Subjective:  Care Team: Primary Care Provider: Melvin Pao, NP ; Next Scheduled Visit: 2/6  Diabetes: Current medications: Mounjaro  7.5mg  weekly, Jardiance  25mg  daily, Lantus  50 units at bedtime, Humalog  5 units TID with meals, metformin  1000mg  BID -Patient recently completed 1 month at Mounjaro  7.5mg  weekly (dose incresed by cardiology)- tolerating dose well with no ADE's -Did have a few low blood sugar readings 2 weeks ago, so she decreased Lantus  dosing from 60 units at bedtime to 50- no hypoglycemia since -Current glucose readings: FBG averaging 110-120 -Enrolled in [employer] to assist with Jardiance  copay  Macrovascular and Microvascular Risk Reduction:  Statin? yes (rosuvastatin  10mg  daily); ACEi/ARB? yes (Entresto  97/103 BID) Last urinary albumin/creatinine ratio:  Lab Results  Component Value Date   MICRALBCREAT 30-300 (H) 12/07/2022   Last eye exam:  Lab Results  Component Value Date   HMDIABEYEEXA No Retinopathy 08/06/2022   Last foot exam: 08/14/2022 Tobacco Use:  Tobacco Use: Low Risk (10/30/2024)   Patient History    Smoking Tobacco Use: Never    Smokeless Tobacco Use: Never    Passive Exposure: Not on file   Heart Failure (EF 40-45%): Current medications:  ACEi/ARB/ARNI: Entresto  97/103mg  BID SGLT2i: Jardiance  25mg  daily Beta blocker: carvedilol  25mg  BID Mineralocorticoid Receptor Antagonist: Spironolactone  25mg  daily  Diuretic regimen: Torsemide  20mg  daily  -Vericiguat  recently increased to 5mg  daily by cardiology -Patient enrolled in [employer] to assist with Jardiance  and Entresto  copays -Does monitor home BP and endorses this is averaging 120/80  Objective: Lab Results   Component Value Date   HGBA1C 15.4 (H) 07/08/2024   Lab Results  Component Value Date   CREATININE 1.02 (H) 07/08/2024   BUN 10 07/08/2024   NA 136 07/08/2024   K 5.2 07/08/2024   CL 94 (L) 07/08/2024   CO2 25 07/08/2024   Lab Results  Component Value Date   CHOL 127 07/03/2024   HDL 55 07/03/2024   LDLCALC 54 07/03/2024   TRIG 97 07/03/2024   CHOLHDL 2.3 07/03/2024   Assessment/Plan:   Diabetes: -Currently uncontrolled; goal A1c <7%- home BG readings reflect improved control -LDL at goal of <70, BP at goal of <130/80 -UACR is not at goal with SGLT2 and Entresto  on board- continue to monitor -Continue Mounjaro  7.5mg  weekly, metformin  1000mg  BID, Jardiance  25mg  daily, and Humalog  5 units TID with meals, and Lantus  50 units daily at this time -Continue to monitor FBG and post-prandial BG daily -Sees PCP again 2/6 and will be due for A1c, UACR, CMP, CBC, and lipid panel.  It appears she is also due for a diabetic foot exam. -Consider increasing Mounjaro  to 10mg  weekly if patient continues to tolerate 7.5mg  dose well; decrease Lantus  dose accordingly based on A1c and BG readings (40-45 units daily)  Heart Failure: -Continue current regimen at this time -Continue regular follow-up with cardiology -Continue to monitor home BP and weight regularly  Follow Up Plan: 4 weeks  Emily Hinton, PharmD, DPLA    "

## 2024-11-19 ENCOUNTER — Encounter: Admitting: *Deleted

## 2024-11-19 DIAGNOSIS — I5022 Chronic systolic (congestive) heart failure: Secondary | ICD-10-CM

## 2024-11-19 NOTE — Progress Notes (Signed)
 Daily Session Note  Patient Details  Name: Emily Hinton MRN: [id number] Date of Birth: August 17, 1961 Referring Provider:   Flowsheet Row Pulmonary Rehab from 10/21/2024 in [facility] Cardiac [facility] Rehab  Referring Provider Rolan Barrack, MD    Encounter Date: 11/19/2024  Check In:  Session Check In - 11/19/24 1404       Check-In   Supervising physician immediately available to respond to emergencies See telemetry face sheet for immediately available ER MD    Location [facility]-Cardiac & Pulmonary Rehab    Staff Present Hoy Rodney RN,BSN;Joseph Rolinda NORWOOD HARMAN Verlie Laird, [place], Exercise Physiologist;Margaret Best, MS, Exercise Physiologist    Virtual Visit No    Medication changes reported     No    Fall or balance concerns reported    No    Warm-up and Cool-down Performed on first and last piece of equipment    Resistance Training Performed Yes    VAD Patient? No    PAD/SET Patient? No      Pain Assessment   Currently in Pain? No/denies             Tobacco Use History[1]  Goals Met:  Independence with exercise equipment Exercise tolerated well No report of concerns or symptoms today Strength training completed today  Goals Unmet:  Not Applicable  Comments: Pt able to follow exercise prescription today without complaint.  Will continue to monitor for progression.    Dr. Oneil Pinal is Medical Director for [facility] Cardiac Rehabilitation.  Dr. Fuad Aleskerov is Medical Director for [facility] Pulmonary Rehabilitation.    [1]  Social History Tobacco Use  Smoking Status Never  Smokeless Tobacco Never

## 2024-11-20 ENCOUNTER — Encounter: Payer: Self-pay | Admitting: Nurse Practitioner

## 2024-11-20 ENCOUNTER — Encounter

## 2024-11-20 ENCOUNTER — Ambulatory Visit: Admitting: Nurse Practitioner

## 2024-11-20 VITALS — BP 123/84 | HR 94 | Temp 97.7°F | Ht 70.98 in | Wt 290.8 lb

## 2024-11-20 DIAGNOSIS — E781 Pure hyperglyceridemia: Secondary | ICD-10-CM

## 2024-11-20 DIAGNOSIS — Z7984 Long term (current) use of oral hypoglycemic drugs: Secondary | ICD-10-CM

## 2024-11-20 DIAGNOSIS — F339 Major depressive disorder, recurrent, unspecified: Secondary | ICD-10-CM

## 2024-11-20 DIAGNOSIS — I5022 Chronic systolic (congestive) heart failure: Secondary | ICD-10-CM

## 2024-11-20 DIAGNOSIS — Z23 Encounter for immunization: Secondary | ICD-10-CM

## 2024-11-20 DIAGNOSIS — I5020 Unspecified systolic (congestive) heart failure: Secondary | ICD-10-CM

## 2024-11-20 DIAGNOSIS — I10 Essential (primary) hypertension: Secondary | ICD-10-CM

## 2024-11-20 DIAGNOSIS — E1169 Type 2 diabetes mellitus with other specified complication: Secondary | ICD-10-CM

## 2024-11-20 LAB — MICROALBUMIN, URINE WAIVED
Creatinine, Urine Waived: 200 mg/dL (ref 10–300)
Microalb, Ur Waived: 10 mg/L (ref 0–19)
Microalb/Creat Ratio: <30 mg/g

## 2024-11-20 NOTE — Assessment & Plan Note (Signed)
 Chronic.  Improved.  Has not followed up with Cardiology.  Does weigh daily- 290lb.  Continue to follow up with Cardiology.  Per patient breathing has improved and swelling have improved with Torsemide . Follow up in 3 months.  Call sooner if concerns arise.  Continue with Mounjaro  to help with weight loss.  - Reminded to call for an overnight weight gain of >2 pounds or a weekly weight gain of >5 pounds - not adding salt to food and read food labels. Reviewed the importance of keeping daily sodium intake to 2000mg  daily. - Avoid Ibuprofen products.

## 2024-11-20 NOTE — Assessment & Plan Note (Signed)
 Chronic. Not well controlled.  However, on chart review, sugars appear to be running <150 at Pulmonary rehab.  Continue with Mounjaro  7.5mg  weekly.  Will increase to 10mg  if still elevated.   Lantus  was decrease to 50u due to low sugars.  Continue with Humalog  5u TID.  Continue to check sugars at home and bring log to next visit.  Follow up in 3 months.  Call sooner if concerns arise.

## 2024-11-20 NOTE — Progress Notes (Signed)
 Daily Session Note  Patient Details  Name: Emily Hinton MRN: [id number] Date of Birth: 1961/03/02 Referring Provider:   Flowsheet Row Pulmonary Rehab from 10/21/2024 in [facility] Cardiac and Pulmonary Rehab  Referring Provider Rolan Barrack, MD    Encounter Date: 11/20/2024  Check In:  Session Check In - 11/20/24 1048       Check-In   Supervising physician immediately available to respond to emergencies See telemetry face sheet for immediately available ER MD    Location [facility]-Cardiac & Pulmonary Rehab    Staff Present Burnard Davenport RN,BSN,MPA;Maxon Conetta BS, Exercise Physiologist;Joseph Hood RCP,RRT,BSRT;Noah Tickle, [place], Exercise Physiologist    Virtual Visit No    Medication changes reported     No    Fall or balance concerns reported    No    Warm-up and Cool-down Performed on first and last piece of equipment    Resistance Training Performed Yes    VAD Patient? No    PAD/SET Patient? No      Pain Assessment   Currently in Pain? No/denies             Tobacco Use History[1]  Goals Met:  Proper associated with RPD/PD & O2 Sat Independence with exercise equipment Using PLB without cueing & demonstrates good technique Exercise tolerated well No report of concerns or symptoms today Strength training completed today  Goals Unmet:  Not Applicable  Comments: Pt able to follow exercise prescription today without complaint.  Will continue to monitor for progression.    Dr. Oneil Pinal is Medical Director for [facility] Cardiac Rehabilitation.  Dr. Fuad Aleskerov is Medical Director for [facility] Pulmonary Rehabilitation.    [1]  Social History Tobacco Use  Smoking Status Never  Smokeless Tobacco Never

## 2024-11-20 NOTE — Assessment & Plan Note (Signed)
Chronic.  Controlled.  Continue with current medication regimen of Carvedilol and Valsartan (Entresto).  Labs ordered today.  Return to clinic in 3 months for reevaluation.  Call sooner if concerns arise.   

## 2024-11-20 NOTE — Assessment & Plan Note (Signed)
 Recommended eating smaller high protein, low fat meals more frequently and exercising 30 mins a day 5 times a week with a goal of 10-15lb weight loss in the next 3 months. Continue with Mounjaro .  Referral placed for Bariatric surgery evaluation.

## 2024-11-20 NOTE — Assessment & Plan Note (Signed)
 Chronic.  Controlled.  Continue with current medication regimen.  Labs ordered today.  Return to clinic in 6 months for reevaluation.  Call sooner if concerns arise.  ? ?

## 2024-11-20 NOTE — Assessment & Plan Note (Signed)
 Chronic. Controlled.  Continue with Celexa  20mg .  Side effects and benefits of medication discussed during visit.  Follow up in 6 months.  Call sooner if concerns arise.

## 2024-11-20 NOTE — Progress Notes (Signed)
 "  BP 123/84 (BP Location: Right Arm, Patient Position: Sitting, Cuff Size: Large)   Pulse 94   Temp 97.7 F (36.5 C) (Oral)   Ht 5' 10.98 (1.803 m)   Wt 290 lb 12.8 oz (131.9 kg)   SpO2 100%   BMI 40.58 kg/m    Subjective:    Patient ID: Emily Hinton, female    DOB: Oct 25, 1960, 64 y.o.   MRN: [id number]  HPI: Emily Hinton is a 64 y.o. female  Chief Complaint  Patient presents with   Office Visit    2 month f/u. Patient stated she is going to rehab twice a week for pulmonology and she is still gaining weight but unsure why.    HYPERTENSION / HYPERLIPIDEMIA/HF Has been following up with Cardiology and HF clinic.  Working on weight loss. Has been out of Jardiance  and needs a refill.  She is weighing daily.  States sometimes she goes down 5lbs then goes right back up. Patient is working with Pulmonary Rehab 2x weekly.  Satisfied with current treatment? yes Duration of hypertension: years BP monitoring frequency: daily BP range: <120/70 BP medication side effects: no Past BP meds: carvedilol  and valsartan  Duration of hyperlipidemia: years Cholesterol medication side effects: no Cholesterol supplements: none Past cholesterol medications: rosuvastatin  (crestor ) Medication compliance: excellent compliance Aspirin : no Recent stressors: no Recurrent headaches: yes Visual changes: no Palpitations: no Dyspnea: yes- but improving Chest pain: no Lower extremity edema: yes- but improving Dizzy/lightheaded: no  DIABETES Patient 's cardiologist is switching her from Ozempic  to Mounjaro .  She is working with the pharmacist to get that taken care of.  She is getting glucose checks at [facility] rehab and sugars are running <150. Hypoglycemic episodes:no Polydipsia/polyuria: no Visual disturbance: no Chest pain: no Paresthesias: no Glucose Monitoring: yes  Accucheck frequency:   Fasting glucose:   Post prandial:  Evening:  Before meals: Taking  Insulin ?: yes  Long acting insulin : Lantus  50u  Short acting insulin : Humalog  5U TID Blood Pressure Monitoring: daily Retinal Examination: Up to Date Foot Exam: up to date Diabetic Education: Not Completed Pneumovax: Up to Date Influenza: Up to Date Aspirin : no  VP SHUNT Patient was having sharp pains in her head and was seen by Neurology and waiting for test results.   DEPRESSION/ANXIETY Patient states he mood has been better.  She is taking citalopram  20mg .  She does still have anxiety with driving.  Her sons still cause her a lot of stress. She does feel like she is managing better at this time.  Denies SI.   Flowsheet Row Office Visit from 11/20/2024 in [facility]  PHQ-9 Total Score 7          Relevant past medical, surgical, family and social history reviewed and updated as indicated. Interim medical history since our last visit reviewed. Allergies and medications reviewed and updated.  Review of Systems  Eyes:  Negative for visual disturbance.  Respiratory:  Negative for cough, chest tightness and shortness of breath.   Cardiovascular:  Negative for chest pain, palpitations and leg swelling.  Endocrine: Negative for polydipsia and polyuria.  Musculoskeletal:        Left leg pain  Skin:  Positive for pallor.  Neurological:  Negative for dizziness, numbness and headaches.  Psychiatric/Behavioral:  Positive for dysphoric mood. Negative for suicidal ideas. The patient is nervous/anxious.     Per HPI unless specifically indicated above     Objective:    BP 123/84 (BP Location: Right  Arm, Patient Position: Sitting, Cuff Size: Large)   Pulse 94   Temp 97.7 F (36.5 C) (Oral)   Ht 5' 10.98 (1.803 m)   Wt 290 lb 12.8 oz (131.9 kg)   SpO2 100%   BMI 40.58 kg/m   Wt Readings from Last 3 Encounters:  11/20/24 290 lb 12.8 oz (131.9 kg)  10/30/24 287 lb 9.6 oz (130.5 kg)  10/30/24 287 lb (130.2 kg)    Physical Exam Vitals and nursing note  reviewed.  Constitutional:      General: She is not in acute distress.    Appearance: Normal appearance. She is obese. She is not ill-appearing, toxic-appearing or diaphoretic.  HENT:     Head: Normocephalic.     Right Ear: External ear normal.     Left Ear: External ear normal.     Nose: Nose normal.     Mouth/Throat:     Mouth: Mucous membranes are moist.     Pharynx: Oropharynx is clear.  Eyes:     General:        Right eye: No discharge.        Left eye: No discharge.     Extraocular Movements: Extraocular movements intact.     Conjunctiva/sclera: Conjunctivae normal.     Pupils: Pupils are equal, round, and reactive to light.  Cardiovascular:     Rate and Rhythm: Normal rate and regular rhythm.     Heart sounds: No murmur heard. Pulmonary:     Effort: Pulmonary effort is normal. No respiratory distress.     Breath sounds: Normal breath sounds. No wheezing or rales.  Musculoskeletal:     Cervical back: Normal range of motion and neck supple.     Right lower leg: No edema.     Left lower leg: No edema.  Skin:    General: Skin is warm and dry.     Capillary Refill: Capillary refill takes less than 2 seconds.     Findings: Rash present. Rash is papular.     Comments: On bilateral feet  Neurological:     General: No focal deficit present.     Mental Status: She is alert and oriented to person, place, and time. Mental status is at baseline.  Psychiatric:        Mood and Affect: Mood normal.        Behavior: Behavior normal.        Thought Content: Thought content normal.        Judgment: Judgment normal.     Results for orders placed or performed in visit on 11/06/24  Glucose, capillary   Collection Time: 11/06/24 11:04 AM  Result Value Ref Range   Glucose-Capillary 149 (H) 70 - 99 mg/dL  Glucose, capillary   Collection Time: 11/06/24 12:07 PM  Result Value Ref Range   Glucose-Capillary 119 (H) 70 - 99 mg/dL      Assessment & Plan:   Problem List Items  Addressed This Visit       Cardiovascular and Mediastinum   Essential hypertension   Chronic.  Controlled.  Continue with current medication regimen of Carvedilol  and Valsartan  (Entresto ).  Labs ordered today.  Return to clinic in 3 months for reevaluation.  Call sooner if concerns arise.       HFrEF (heart failure with reduced ejection fraction) (HCC) - Primary   Chronic.  Improved.  Has not followed up with Cardiology.  Does weigh daily- 290lb.  Continue to follow up with Cardiology.  Per patient  breathing has improved and swelling have improved with Torsemide . Follow up in 3 months.  Call sooner if concerns arise.  Continue with Mounjaro  to help with weight loss.  - Reminded to call for an overnight weight gain of >2 pounds or a weekly weight gain of >5 pounds - not adding salt to food and read food labels. Reviewed the importance of keeping daily sodium intake to 2000mg  daily. - Avoid Ibuprofen products.      Relevant Orders   Comprehensive metabolic panel with GFR     Endocrine   Diabetes mellitus treated with insulin  and oral medication (HCC)   Chronic. Not well controlled.  However, on chart review, sugars appear to be running <150 at Pulmonary rehab.  Continue with Mounjaro  7.5mg  weekly.  Will increase to 10mg  if still elevated.   Lantus  was decrease to 50u due to low sugars.  Continue with Humalog  5u TID.  Continue to check sugars at home and bring log to next visit.  Follow up in 3 months.  Call sooner if concerns arise.         Other   Morbid obesity (HCC)   Recommended eating smaller high protein, low fat meals more frequently and exercising 30 mins a day 5 times a week with a goal of 10-15lb weight loss in the next 3 months. Continue with Mounjaro .  Referral placed for Bariatric surgery evaluation.       Relevant Orders   Amb Referral to Bariatric Surgery   Hypertriglyceridemia   Chronic.  Controlled.  Continue with current medication regimen.  Labs ordered today.   Return to clinic in 6 months for reevaluation.  Call sooner if concerns arise.        Relevant Orders   Lipid panel   Depression, recurrent   Chronic. Controlled.  Continue with Celexa  20mg .  Side effects and benefits of medication discussed during visit.  Follow up in 6 months.  Call sooner if concerns arise.       Other Visit Diagnoses       Need for COVID-19 vaccine       Relevant Orders   Pfizer Comirnaty Covid-19 Vaccine 35yrs & older            Follow up plan: Return in about 3 months (around 02/17/2025) for HTN, HLD, DM2 FU.      "

## 2024-11-23 ENCOUNTER — Telehealth

## 2024-11-26 ENCOUNTER — Encounter

## 2024-11-27 ENCOUNTER — Encounter

## 2024-11-30 ENCOUNTER — Telehealth

## 2024-12-03 ENCOUNTER — Encounter

## 2024-12-04 ENCOUNTER — Encounter

## 2024-12-10 ENCOUNTER — Encounter

## 2024-12-11 ENCOUNTER — Encounter

## 2024-12-11 ENCOUNTER — Other Ambulatory Visit

## 2024-12-17 ENCOUNTER — Encounter

## 2024-12-18 ENCOUNTER — Encounter

## 2024-12-24 ENCOUNTER — Encounter

## 2024-12-25 ENCOUNTER — Encounter

## 2024-12-31 ENCOUNTER — Encounter

## 2025-01-01 ENCOUNTER — Encounter

## 2025-01-07 ENCOUNTER — Encounter

## 2025-01-08 ENCOUNTER — Encounter

## 2025-01-14 ENCOUNTER — Encounter

## 2025-01-14 ENCOUNTER — Other Ambulatory Visit

## 2025-01-15 ENCOUNTER — Encounter

## 2025-01-21 ENCOUNTER — Encounter

## 2025-01-22 ENCOUNTER — Encounter

## 2025-01-28 ENCOUNTER — Encounter

## 2025-01-29 ENCOUNTER — Encounter

## 2025-02-04 ENCOUNTER — Encounter

## 2025-02-05 ENCOUNTER — Encounter

## 2025-02-11 ENCOUNTER — Encounter

## 2025-02-12 ENCOUNTER — Encounter

## 2025-02-18 ENCOUNTER — Ambulatory Visit

## 2025-02-18 ENCOUNTER — Encounter

## 2025-02-19 ENCOUNTER — Ambulatory Visit: Admitting: Nurse Practitioner

## 2025-03-19 ENCOUNTER — Ambulatory Visit: Admitting: Physician Assistant

## 2025-03-19 ENCOUNTER — Ambulatory Visit: Admitting: Family
